# Patient Record
Sex: Male | Born: 1945
Health system: Southern US, Community
[De-identification: ages and names within clinical notes are randomized; demographics above are authoritative.]

## PROBLEM LIST (undated history)

## (undated) DIAGNOSIS — K269 Duodenal ulcer, unspecified as acute or chronic, without hemorrhage or perforation: Secondary | ICD-10-CM

## (undated) DIAGNOSIS — M1990 Unspecified osteoarthritis, unspecified site: Secondary | ICD-10-CM

## (undated) DIAGNOSIS — Z87442 Personal history of urinary calculi: Secondary | ICD-10-CM

## (undated) DIAGNOSIS — F329 Major depressive disorder, single episode, unspecified: Secondary | ICD-10-CM

## (undated) DIAGNOSIS — I10 Essential (primary) hypertension: Secondary | ICD-10-CM

## (undated) DIAGNOSIS — D649 Anemia, unspecified: Secondary | ICD-10-CM

## (undated) DIAGNOSIS — M109 Gout, unspecified: Secondary | ICD-10-CM

## (undated) DIAGNOSIS — Z5189 Encounter for other specified aftercare: Secondary | ICD-10-CM

## (undated) DIAGNOSIS — F32A Depression, unspecified: Secondary | ICD-10-CM

## (undated) DIAGNOSIS — N200 Calculus of kidney: Secondary | ICD-10-CM

## (undated) DIAGNOSIS — K2981 Duodenitis with bleeding: Secondary | ICD-10-CM

## (undated) HISTORY — DX: Depression, unspecified: F32.A

## (undated) HISTORY — PX: COLONOSCOPY: SHX174

## (undated) HISTORY — DX: Major depressive disorder, single episode, unspecified: F32.9

## (undated) HISTORY — PX: ABDOMINAL SURGERY: SHX537

## (undated) HISTORY — DX: Anemia, unspecified: D64.9

## (undated) HISTORY — DX: Duodenal ulcer, unspecified as acute or chronic, without hemorrhage or perforation: K26.9

## (undated) HISTORY — DX: Unspecified osteoarthritis, unspecified site: M19.90

## (undated) HISTORY — DX: Duodenitis with bleeding: K29.81

## (undated) HISTORY — PX: LITHOTRIPSY: SUR834

## (undated) HISTORY — DX: Encounter for other specified aftercare: Z51.89

---

## 1997-11-06 ENCOUNTER — Emergency Department (HOSPITAL_COMMUNITY): Admission: EM | Admit: 1997-11-06 | Discharge: 1997-11-06 | Payer: Self-pay

## 1997-11-08 ENCOUNTER — Encounter (HOSPITAL_COMMUNITY): Admission: RE | Admit: 1997-11-08 | Discharge: 1998-02-06 | Payer: Self-pay | Admitting: Emergency Medicine

## 1997-11-08 ENCOUNTER — Emergency Department (HOSPITAL_COMMUNITY): Admission: EM | Admit: 1997-11-08 | Discharge: 1997-11-08 | Payer: Self-pay | Admitting: Emergency Medicine

## 1997-11-12 ENCOUNTER — Emergency Department (HOSPITAL_COMMUNITY): Admission: EM | Admit: 1997-11-12 | Discharge: 1997-11-12 | Payer: Self-pay | Admitting: Emergency Medicine

## 1997-12-28 ENCOUNTER — Ambulatory Visit (HOSPITAL_COMMUNITY): Admission: RE | Admit: 1997-12-28 | Discharge: 1997-12-28 | Payer: Self-pay | Admitting: Urology

## 2000-12-23 ENCOUNTER — Encounter: Payer: Self-pay | Admitting: Urology

## 2000-12-23 ENCOUNTER — Encounter: Admission: RE | Admit: 2000-12-23 | Discharge: 2000-12-23 | Payer: Self-pay | Admitting: Urology

## 2000-12-28 ENCOUNTER — Ambulatory Visit (HOSPITAL_COMMUNITY): Admission: RE | Admit: 2000-12-28 | Discharge: 2000-12-28 | Payer: Self-pay | Admitting: Urology

## 2001-01-07 ENCOUNTER — Encounter: Admission: RE | Admit: 2001-01-07 | Discharge: 2001-01-07 | Payer: Self-pay | Admitting: Urology

## 2001-01-07 ENCOUNTER — Encounter: Payer: Self-pay | Admitting: Urology

## 2001-01-21 ENCOUNTER — Encounter: Payer: Self-pay | Admitting: Urology

## 2001-01-21 ENCOUNTER — Encounter: Admission: RE | Admit: 2001-01-21 | Discharge: 2001-01-21 | Payer: Self-pay | Admitting: Urology

## 2001-04-19 ENCOUNTER — Encounter: Payer: Self-pay | Admitting: Urology

## 2001-04-19 ENCOUNTER — Encounter: Admission: RE | Admit: 2001-04-19 | Discharge: 2001-04-19 | Payer: Self-pay | Admitting: Urology

## 2001-08-24 ENCOUNTER — Encounter: Admission: RE | Admit: 2001-08-24 | Discharge: 2001-08-24 | Payer: Self-pay | Admitting: Urology

## 2001-08-24 ENCOUNTER — Encounter: Payer: Self-pay | Admitting: Urology

## 2002-11-19 ENCOUNTER — Emergency Department (HOSPITAL_COMMUNITY): Admission: EM | Admit: 2002-11-19 | Discharge: 2002-11-19 | Payer: Self-pay | Admitting: Emergency Medicine

## 2006-01-06 ENCOUNTER — Encounter (INDEPENDENT_AMBULATORY_CARE_PROVIDER_SITE_OTHER): Payer: Self-pay | Admitting: Specialist

## 2006-01-06 ENCOUNTER — Encounter: Payer: Self-pay | Admitting: Internal Medicine

## 2006-01-06 ENCOUNTER — Ambulatory Visit (HOSPITAL_COMMUNITY): Admission: RE | Admit: 2006-01-06 | Discharge: 2006-01-06 | Payer: Self-pay | Admitting: Gastroenterology

## 2009-03-27 ENCOUNTER — Encounter (INDEPENDENT_AMBULATORY_CARE_PROVIDER_SITE_OTHER): Payer: Self-pay | Admitting: *Deleted

## 2009-05-04 ENCOUNTER — Encounter (INDEPENDENT_AMBULATORY_CARE_PROVIDER_SITE_OTHER): Payer: Self-pay | Admitting: *Deleted

## 2009-05-07 ENCOUNTER — Ambulatory Visit: Payer: Self-pay | Admitting: Internal Medicine

## 2009-05-23 ENCOUNTER — Ambulatory Visit: Payer: Self-pay | Admitting: Internal Medicine

## 2009-05-24 ENCOUNTER — Encounter: Payer: Self-pay | Admitting: Internal Medicine

## 2009-07-30 ENCOUNTER — Telehealth (INDEPENDENT_AMBULATORY_CARE_PROVIDER_SITE_OTHER): Payer: Self-pay | Admitting: *Deleted

## 2010-02-26 ENCOUNTER — Emergency Department (HOSPITAL_COMMUNITY): Admission: EM | Admit: 2010-02-26 | Discharge: 2010-02-26 | Payer: Self-pay | Admitting: Emergency Medicine

## 2010-06-18 NOTE — Progress Notes (Signed)
  Phone Note Outgoing Call   Summary of Call: Message left for pt. that we have not yet received colon and path. reports from Dr.Weissman as requested at the time of his colonoscopy for Dr.Perry's review.Asked if he could please try to obtain them. Initial call taken by: Teryl Lucy RN,  July 30, 2009 10:59 AM

## 2010-06-18 NOTE — Letter (Signed)
Summary: Patient Notice- Polyp Results  [facility]  [address] [place], [place] [address]   Phone: [phone]  Fax: [phone]        May 24, 2009 MRN: [id number]    [facility] [address] [place], [place]  [address]    Dear Mr. Lorensen,  I am pleased to inform you that the colon polyp(s) removed during your recent colonoscopy was (were) found to be benign (no cancer detected) upon pathologic examination.  I recommend you have a repeat colonoscopy examination in 5 years to look for recurrent polyps, as having colon polyps increases your risk for having recurrent polyps or even colon cancer in the future.  Should you develop new or worsening symptoms of abdominal pain, bowel habit changes or bleeding from the rectum or bowels, please schedule an evaluation with either your primary care physician or with me.  Additional information/recommendations:  __ No further action with gastroenterology is needed at this time. Please      follow-up with your primary care physician for your other healthcare      needs.   Please call us if you are having persistent problems or have questions about your condition that have not been fully answered at this time.  Sincerely,  Hilarie Fredrickson MD  This letter has been electronically signed by your physician.  Appended Document: Patient Notice- Polyp Results Letter mailed 01.13.11

## 2010-06-18 NOTE — Procedures (Signed)
Summary: Tasia Catchings MD  Tasia Catchings MD   Imported By: Lester [place]ise[place] 08/14/2009 07:59:50  _____________________________________________________________________  External Attachment:    Type:   Image     Comment:   External Document

## 2010-06-18 NOTE — Procedures (Signed)
Summary: Colonoscopy  Patient: Tyrone [name] Note: All result statuses are Final unless otherwise noted.  Tests: (1) Colonoscopy (COL)   COL Colonoscopy           DONE     [facility]     [address]     [place], [place]  [address]           COLONOSCOPY PROCEDURE REPORT           PATIENT:  Tyrone [name], Tyrone [name]  MR#:  [id number]     BIRTHDATE:  [birth year], 63 yrs. old  GENDER:  male           ENDOSCOPIST:  Wilhemina Bonito. Eda Keys, MD     Referred by:  .Direct (Self)           PROCEDURE DATE:  05/23/2009     PROCEDURE:  Colonoscopy with snare polypectomy     ASA CLASS:  Class II     INDICATIONS:  history of pre-cancerous (adenomatous) colon polyps     (two prior colonoscopies, last one ?58yrs ago w/ Dr. Sherin Quarry w/     polyp)           MEDICATIONS:   Fentanyl 100 mcg IV, Versed 10 mg IV           DESCRIPTION OF PROCEDURE:   After the risks benefits and     alternatives of the procedure were thoroughly explained, informed     consent was obtained.  Digital rectal exam was performed and     revealed no abnormalities.   The LB CF-[id number] endoscope     was introduced through the anus and advanced to the cecum, which     was identified by both the appendix and ileocecal valve, without     limitations.Time to cecum = 4:37min.  The quality of the prep was     excellent, using MoviPrep.  The instrument was then withdrawn     (time = 19:00 min) as the colon was fully examined.     <<PROCEDUREIMAGES>>           FINDINGS:  Three 2mm polyps were found in the cecum (2) and     ascending colon. Polyps were snared without cautery. Retrieval was     successful. Mild diverticulosis was found in the sigmoid colon.     This was otherwise a normal examination of the colon.   Retroflexed     views in the rectum revealed internal hemorrhoids.    The scope     was then withdrawn from the patient and the procedure completed.           COMPLICATIONS:  None           ENDOSCOPIC  IMPRESSION:     1) Three polyps - removed     2) Mild diverticulosis in the sigmoid colon     3) Otherwise normal examination     4) Internal hemorrhoids           RECOMMENDATIONS:     1) Follow up colonoscopy in 5 years     2) PLEASE GET PRIOR COLONOSCOPY REPORTS AND PATHOLOGY FROM DR     Fayrene Fearing [facility] FOR DR Marina Goodell TO REVIEW           ______________________________     Wilhemina Bonito. Eda Keys, MD           CC:  Georgann Housekeeper MD; The Patient  n.     eSIGNED:   Wilhemina Bonito. Eda Keys at 05/23/2009 10:38 AM           Givhan, Viviann Spare, [id number]  Note: An exclamation mark (!) indicates a result that was not dispersed into the flowsheet. Document Creation Date: 05/23/2009 10:38 AM _______________________________________________________________________  (1) Order result status: Final Collection or observation date-time: 05/23/2009 10:29 Requested date-time:  Receipt date-time:  Reported date-time:  Referring Physician:   Ordering Physician: Fransico Setters [phone]) Specimen Source:  Source: Launa Grill Order Number: [id number] Lab site:   Appended Document: Colonoscopy Recall colonoscopy in 5 years  Appended Document: Colonoscopy     Procedures Next Due Date:    Colonoscopy: 05/2014

## 2010-10-04 NOTE — Op Note (Signed)
[facility]  Patient:    Tyrone [name], Tyrone [name]                    MRN: [id number] Proc. Date: 12/28/00 Adm. Date:  [id number] Attending:  Thermon Leyland                           Operative Report  PREOPERATIVE DIAGNOSIS:  Left ureteropelvic junction stone.  POSTOPERATIVE DIAGNOSIS: 1. Left ureteropelvic junction stone. 2. Left ureteropelvic junction obstruction.  OPERATION PERFORMED:  Cystoscopy, retrograde pyelography, balloon dilation of the left ureteropelvic junction, flexible and rigid ureteroscopy and double-J stent placement.  SURGEON:  Barron Alvine, M.D.  ANESTHESIA:  General.  INDICATIONS FOR PROCEDURE:  Tyrone [name] is a 65 year old male.  He has had recurrent nephrolithiasis.  He has been able to pass stones on the right side without much difficulty but has had difficulty passing left-sided stones and was thought to potentially have a mild left ureteropelvic junction obstruction.  He presented with flank pain and the CT scan showed what appeared to be a 3 to 4 mm stone wedged in the left ureteropelvic junction. Because of his previous history, he did not want to continue with efforts at spontaneous passage.  He has also failed lithotripsy in the past and given the worries over anatomic configuration of this area, we thought attempt at ureteroscopy with double-J stent placement and assessment of ureteropelvic junction was probably the best course of action.  DESCRIPTION OF PROCEDURE:  The patient was brought to the operating room where he had successful induction of general anesthesia.  He was placed in lithotomy position and prepped and draped in the usual manner.  Cystoscopy was unremarkable.  Retrograde pyelogram showed a normal ureter without filling defects with significant narrowing and obstruction at the ureteropelvic junction.  It was difficult to determine whether this was simply an anatomic narrowing or whether there was a  stone present as well.  The guide wire was able to be passed through this area without difficulty.  Rigid ureteroscopy was then performed.  The entire ureter was unremarkable.  As we approached the ureteropelvic junction, one could see considerable narrowing and the opening only allowed the wire to pass.  We were unable to insert the ureteroscope into the renal pelvis.  We did not see an obvious stone and this appeared to be a narrowed area.  The rigid ureteroscope was removed and a 4 cm standard dilating balloon was then placed across the ureteropelvic junction.  This was inflated to 12 atmospheres of pressure for approximately five minutes and then deflated.  Repeat ureteroscopy was done with the flexible scope.  An access sheath was inserted over the guide wire.  The miniflexible ureteroscope was then inserted alongside the wire.  When we encountered the ureteropelvic junction, one could see a marked improvement and this was fairly open and easily accessible with the flexible scope.  One could see numerous little pieces of stone material and the thought was that potentially the balloon  had broken up the stone that had been apparently lodged in the ureteropelvic junction.  I was able to perform ureteroscopy of the other caliceal system and did not see obvious stones that I could appreciate.  The flexible ureteroscope was then removed.  Over the guide wire an 8.5 French 24 cm double-J stent was placed to try to hopefully keep the ureteropelvic junction area open.  The patient may require more definitive intervention  such as an Acusize or possible open pyeloplasty in the future but hopefully not.  We will get follow-up imaging studies done to be sure he does not have any residual calculi that were missed with ureteroscopy. DD:  12/28/00 TD:  12/28/00 Job: [id number]

## 2011-08-07 DIAGNOSIS — F329 Major depressive disorder, single episode, unspecified: Secondary | ICD-10-CM | POA: Diagnosis not present

## 2011-08-07 DIAGNOSIS — M109 Gout, unspecified: Secondary | ICD-10-CM | POA: Diagnosis not present

## 2011-08-07 DIAGNOSIS — I10 Essential (primary) hypertension: Secondary | ICD-10-CM | POA: Diagnosis not present

## 2011-08-07 DIAGNOSIS — Z1331 Encounter for screening for depression: Secondary | ICD-10-CM | POA: Diagnosis not present

## 2011-09-17 DIAGNOSIS — M109 Gout, unspecified: Secondary | ICD-10-CM | POA: Diagnosis not present

## 2011-09-17 DIAGNOSIS — M1990 Unspecified osteoarthritis, unspecified site: Secondary | ICD-10-CM | POA: Diagnosis not present

## 2011-09-17 DIAGNOSIS — Z79899 Other long term (current) drug therapy: Secondary | ICD-10-CM | POA: Diagnosis not present

## 2012-04-09 DIAGNOSIS — Z23 Encounter for immunization: Secondary | ICD-10-CM | POA: Diagnosis not present

## 2012-09-16 DIAGNOSIS — M109 Gout, unspecified: Secondary | ICD-10-CM | POA: Diagnosis not present

## 2012-09-16 DIAGNOSIS — Z79899 Other long term (current) drug therapy: Secondary | ICD-10-CM | POA: Diagnosis not present

## 2012-09-16 DIAGNOSIS — M1990 Unspecified osteoarthritis, unspecified site: Secondary | ICD-10-CM | POA: Diagnosis not present

## 2013-01-21 DIAGNOSIS — D3130 Benign neoplasm of unspecified choroid: Secondary | ICD-10-CM | POA: Diagnosis not present

## 2013-01-21 DIAGNOSIS — H2510 Age-related nuclear cataract, unspecified eye: Secondary | ICD-10-CM | POA: Diagnosis not present

## 2013-01-21 DIAGNOSIS — H359 Unspecified retinal disorder: Secondary | ICD-10-CM | POA: Diagnosis not present

## 2013-01-21 DIAGNOSIS — H5200 Hypermetropia, unspecified eye: Secondary | ICD-10-CM | POA: Diagnosis not present

## 2013-01-27 DIAGNOSIS — N400 Enlarged prostate without lower urinary tract symptoms: Secondary | ICD-10-CM | POA: Diagnosis not present

## 2013-01-27 DIAGNOSIS — E782 Mixed hyperlipidemia: Secondary | ICD-10-CM | POA: Diagnosis not present

## 2013-01-27 DIAGNOSIS — I10 Essential (primary) hypertension: Secondary | ICD-10-CM | POA: Diagnosis not present

## 2013-01-27 DIAGNOSIS — Z0000 Encounter for general adult medical examination without abnormal findings: Secondary | ICD-10-CM | POA: Diagnosis not present

## 2013-01-27 DIAGNOSIS — F329 Major depressive disorder, single episode, unspecified: Secondary | ICD-10-CM | POA: Diagnosis not present

## 2013-01-27 DIAGNOSIS — M109 Gout, unspecified: Secondary | ICD-10-CM | POA: Diagnosis not present

## 2013-01-27 DIAGNOSIS — N529 Male erectile dysfunction, unspecified: Secondary | ICD-10-CM | POA: Diagnosis not present

## 2013-01-27 DIAGNOSIS — Z1331 Encounter for screening for depression: Secondary | ICD-10-CM | POA: Diagnosis not present

## 2013-03-08 DIAGNOSIS — I10 Essential (primary) hypertension: Secondary | ICD-10-CM | POA: Diagnosis not present

## 2013-03-08 DIAGNOSIS — R7309 Other abnormal glucose: Secondary | ICD-10-CM | POA: Diagnosis not present

## 2013-03-08 DIAGNOSIS — Z23 Encounter for immunization: Secondary | ICD-10-CM | POA: Diagnosis not present

## 2013-03-22 DIAGNOSIS — Z79899 Other long term (current) drug therapy: Secondary | ICD-10-CM | POA: Diagnosis not present

## 2013-03-22 DIAGNOSIS — M109 Gout, unspecified: Secondary | ICD-10-CM | POA: Diagnosis not present

## 2013-03-22 DIAGNOSIS — M1990 Unspecified osteoarthritis, unspecified site: Secondary | ICD-10-CM | POA: Diagnosis not present

## 2013-03-23 DIAGNOSIS — M109 Gout, unspecified: Secondary | ICD-10-CM | POA: Diagnosis not present

## 2013-03-30 DIAGNOSIS — I10 Essential (primary) hypertension: Secondary | ICD-10-CM | POA: Diagnosis not present

## 2013-03-30 DIAGNOSIS — R7989 Other specified abnormal findings of blood chemistry: Secondary | ICD-10-CM | POA: Diagnosis not present

## 2013-06-15 DIAGNOSIS — Z23 Encounter for immunization: Secondary | ICD-10-CM | POA: Diagnosis not present

## 2013-06-15 DIAGNOSIS — I10 Essential (primary) hypertension: Secondary | ICD-10-CM | POA: Diagnosis not present

## 2013-09-13 DIAGNOSIS — F329 Major depressive disorder, single episode, unspecified: Secondary | ICD-10-CM | POA: Diagnosis not present

## 2013-09-13 DIAGNOSIS — I10 Essential (primary) hypertension: Secondary | ICD-10-CM | POA: Diagnosis not present

## 2013-09-13 DIAGNOSIS — M109 Gout, unspecified: Secondary | ICD-10-CM | POA: Diagnosis not present

## 2013-09-20 DIAGNOSIS — M25549 Pain in joints of unspecified hand: Secondary | ICD-10-CM | POA: Diagnosis not present

## 2013-09-20 DIAGNOSIS — M1990 Unspecified osteoarthritis, unspecified site: Secondary | ICD-10-CM | POA: Diagnosis not present

## 2013-09-20 DIAGNOSIS — R5383 Other fatigue: Secondary | ICD-10-CM | POA: Diagnosis not present

## 2013-09-20 DIAGNOSIS — M109 Gout, unspecified: Secondary | ICD-10-CM | POA: Diagnosis not present

## 2013-09-20 DIAGNOSIS — R5381 Other malaise: Secondary | ICD-10-CM | POA: Diagnosis not present

## 2013-09-20 DIAGNOSIS — Z79899 Other long term (current) drug therapy: Secondary | ICD-10-CM | POA: Diagnosis not present

## 2013-10-12 DIAGNOSIS — M25549 Pain in joints of unspecified hand: Secondary | ICD-10-CM | POA: Diagnosis not present

## 2013-10-12 DIAGNOSIS — M1990 Unspecified osteoarthritis, unspecified site: Secondary | ICD-10-CM | POA: Diagnosis not present

## 2013-10-12 DIAGNOSIS — Z79899 Other long term (current) drug therapy: Secondary | ICD-10-CM | POA: Diagnosis not present

## 2013-10-12 DIAGNOSIS — M109 Gout, unspecified: Secondary | ICD-10-CM | POA: Diagnosis not present

## 2014-02-16 DIAGNOSIS — N400 Enlarged prostate without lower urinary tract symptoms: Secondary | ICD-10-CM | POA: Diagnosis not present

## 2014-02-16 DIAGNOSIS — K219 Gastro-esophageal reflux disease without esophagitis: Secondary | ICD-10-CM | POA: Diagnosis not present

## 2014-02-16 DIAGNOSIS — Z23 Encounter for immunization: Secondary | ICD-10-CM | POA: Diagnosis not present

## 2014-02-16 DIAGNOSIS — R7309 Other abnormal glucose: Secondary | ICD-10-CM | POA: Diagnosis not present

## 2014-02-16 DIAGNOSIS — F322 Major depressive disorder, single episode, severe without psychotic features: Secondary | ICD-10-CM | POA: Diagnosis not present

## 2014-02-16 DIAGNOSIS — I10 Essential (primary) hypertension: Secondary | ICD-10-CM | POA: Diagnosis not present

## 2014-02-16 DIAGNOSIS — Z0000 Encounter for general adult medical examination without abnormal findings: Secondary | ICD-10-CM | POA: Diagnosis not present

## 2014-02-16 DIAGNOSIS — M109 Gout, unspecified: Secondary | ICD-10-CM | POA: Diagnosis not present

## 2014-02-16 DIAGNOSIS — N529 Male erectile dysfunction, unspecified: Secondary | ICD-10-CM | POA: Diagnosis not present

## 2014-03-09 DIAGNOSIS — M1009 Idiopathic gout, multiple sites: Secondary | ICD-10-CM | POA: Diagnosis not present

## 2014-03-09 DIAGNOSIS — Z79899 Other long term (current) drug therapy: Secondary | ICD-10-CM | POA: Diagnosis not present

## 2014-03-09 DIAGNOSIS — M79643 Pain in unspecified hand: Secondary | ICD-10-CM | POA: Diagnosis not present

## 2014-03-09 DIAGNOSIS — M1990 Unspecified osteoarthritis, unspecified site: Secondary | ICD-10-CM | POA: Diagnosis not present

## 2014-05-13 ENCOUNTER — Emergency Department (HOSPITAL_COMMUNITY)
Admission: EM | Admit: 2014-05-13 | Discharge: 2014-05-13 | Disposition: A | Discharge status: Home / Self Care | Payer: Medicare Other | Attending: Emergency Medicine | Admitting: Emergency Medicine

## 2014-05-13 ENCOUNTER — Emergency Department (HOSPITAL_COMMUNITY): Payer: Medicare Other

## 2014-05-13 ENCOUNTER — Encounter (HOSPITAL_COMMUNITY): Payer: Self-pay | Admitting: Emergency Medicine

## 2014-05-13 DIAGNOSIS — N200 Calculus of kidney: Secondary | ICD-10-CM | POA: Diagnosis not present

## 2014-05-13 DIAGNOSIS — I10 Essential (primary) hypertension: Secondary | ICD-10-CM | POA: Diagnosis not present

## 2014-05-13 DIAGNOSIS — N23 Unspecified renal colic: Secondary | ICD-10-CM | POA: Insufficient documentation

## 2014-05-13 DIAGNOSIS — R1012 Left upper quadrant pain: Secondary | ICD-10-CM | POA: Diagnosis not present

## 2014-05-13 DIAGNOSIS — N281 Cyst of kidney, acquired: Secondary | ICD-10-CM | POA: Diagnosis not present

## 2014-05-13 DIAGNOSIS — Z87442 Personal history of urinary calculi: Secondary | ICD-10-CM | POA: Diagnosis not present

## 2014-05-13 DIAGNOSIS — R109 Unspecified abdominal pain: Secondary | ICD-10-CM | POA: Diagnosis present

## 2014-05-13 DIAGNOSIS — R1032 Left lower quadrant pain: Secondary | ICD-10-CM | POA: Diagnosis not present

## 2014-05-13 HISTORY — DX: Calculus of kidney: N20.0

## 2014-05-13 HISTORY — DX: Essential (primary) hypertension: I10

## 2014-05-13 LAB — CBC WITH DIFFERENTIAL/PLATELET
BASOS ABS: 0.1 10*3/uL (ref 0.0–0.1)
BASOS PCT: 1 % (ref 0–1)
Eosinophils Absolute: 0.2 10*3/uL (ref 0.0–0.7)
Eosinophils Relative: 3 % (ref 0–5)
HCT: 39.8 % (ref 39.0–52.0)
HEMOGLOBIN: 12.8 g/dL — AB (ref 13.0–17.0)
Lymphocytes Relative: 25 % (ref 12–46)
Lymphs Abs: 2 10*3/uL (ref 0.7–4.0)
MCH: 31.1 pg (ref 26.0–34.0)
MCHC: 32.2 g/dL (ref 30.0–36.0)
MCV: 96.6 fL (ref 78.0–100.0)
MONOS PCT: 5 % (ref 3–12)
Monocytes Absolute: 0.4 10*3/uL (ref 0.1–1.0)
NEUTROS ABS: 5.2 10*3/uL (ref 1.7–7.7)
NEUTROS PCT: 66 % (ref 43–77)
PLATELETS: 202 10*3/uL (ref 150–400)
RBC: 4.12 MIL/uL — ABNORMAL LOW (ref 4.22–5.81)
RDW: 11.9 % (ref 11.5–15.5)
WBC: 7.8 10*3/uL (ref 4.0–10.5)

## 2014-05-13 LAB — URINALYSIS, ROUTINE W REFLEX MICROSCOPIC
Bilirubin Urine: NEGATIVE
Glucose, UA: 500 mg/dL — AB
Hgb urine dipstick: NEGATIVE
Ketones, ur: NEGATIVE mg/dL
Leukocytes, UA: NEGATIVE
Nitrite: NEGATIVE
Protein, ur: NEGATIVE mg/dL
Specific Gravity, Urine: 1.022 (ref 1.005–1.030)
Urobilinogen, UA: 0.2 mg/dL (ref 0.0–1.0)
pH: 5 (ref 5.0–8.0)

## 2014-05-13 LAB — COMPREHENSIVE METABOLIC PANEL
ALT: 19 U/L (ref 0–53)
AST: 22 U/L (ref 0–37)
Albumin: 3.8 g/dL (ref 3.5–5.2)
Alkaline Phosphatase: 69 U/L (ref 39–117)
Anion gap: 6 (ref 5–15)
BUN: 19 mg/dL (ref 6–23)
CO2: 27 mmol/L (ref 19–32)
CREATININE: 0.95 mg/dL (ref 0.50–1.35)
Calcium: 9.1 mg/dL (ref 8.4–10.5)
Chloride: 106 mEq/L (ref 96–112)
GFR calc Af Amer: >90 mL/min (ref >90)
GFR, EST NON AFRICAN AMERICAN: 84 mL/min — AB (ref >90)
Glucose, Bld: 114 mg/dL — ABNORMAL HIGH (ref 70–99)
Potassium: 3.7 mmol/L (ref 3.5–5.1)
Sodium: 139 mmol/L (ref 135–145)
Total Bilirubin: 0.8 mg/dL (ref 0.3–1.2)
Total Protein: 6.9 g/dL (ref 6.0–8.3)

## 2014-05-13 LAB — LIPASE, BLOOD: LIPASE: 21 U/L (ref 11–59)

## 2014-05-13 LAB — CBG MONITORING, ED: Glucose-Capillary: 98 mg/dL (ref 70–99)

## 2014-05-13 IMAGING — CT CT RENAL STONE PROTOCOL
1 series · 15 of 32 positions shown, 19 images · non-contrast
Comparison: None.

CLINICAL DATA: Left-sided flank pain since this morning, history of
renal calculi

EXAM:
CT ABDOMEN AND PELVIS WITHOUT CONTRAST
TECHNIQUE: Multidetector CT imaging of the abdomen and pelvis was performed
following the standard protocol without IV contrast.

[Series 6: lung · axial · 0.91mm/px · z∈[-84,+66]mm · 15 of 35 slices shown, 19 images]
[im 3/35  soft-tissue]
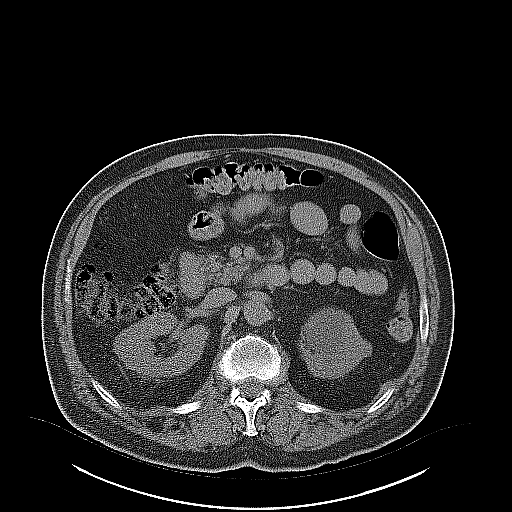
[im 3/35  bone]
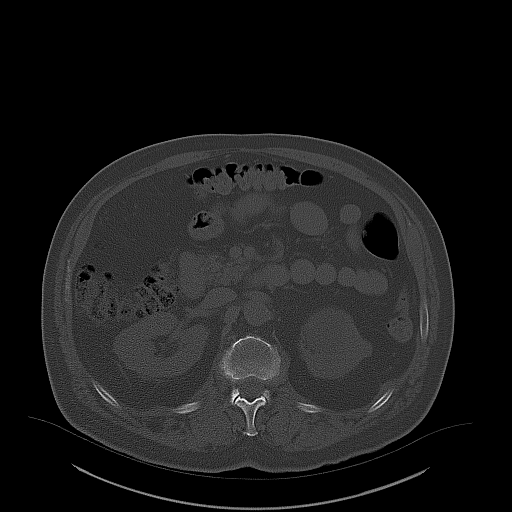
[im 5/35  soft-tissue]
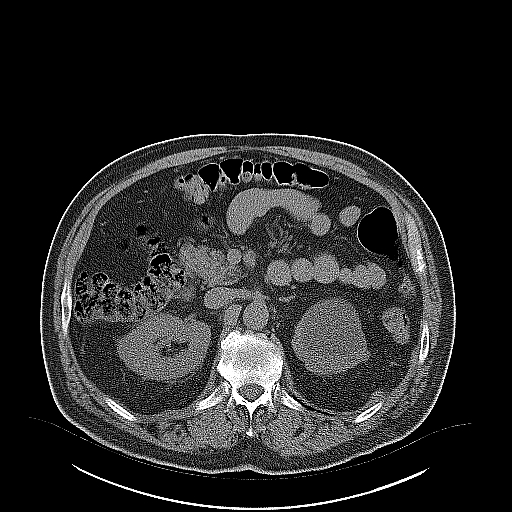
[im 7/35  soft-tissue]
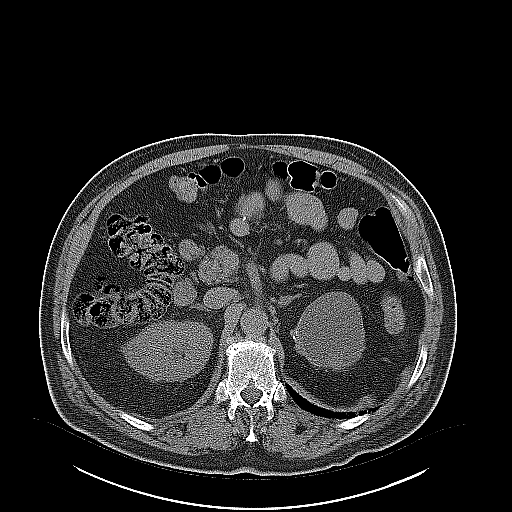
[im 10/35  soft-tissue]
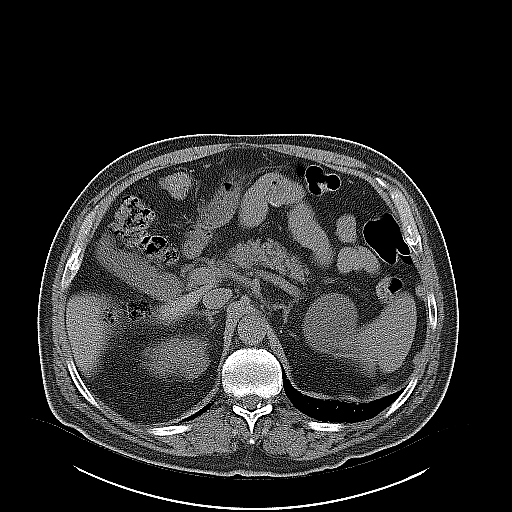
[im 13/35  soft-tissue]
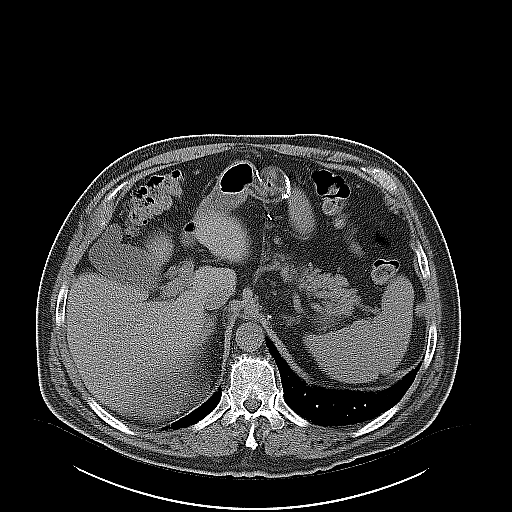
[im 15/35  soft-tissue]
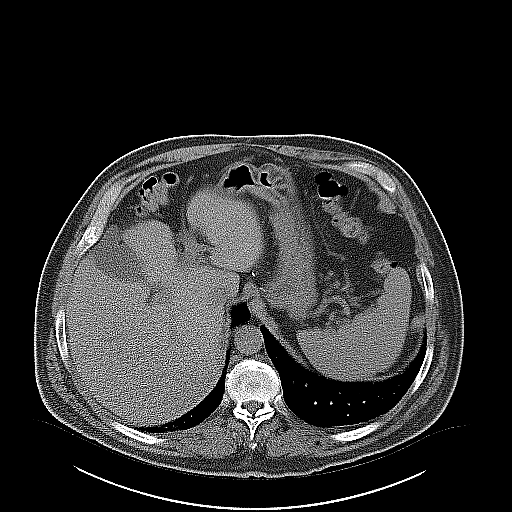
[im 18/35  soft-tissue]
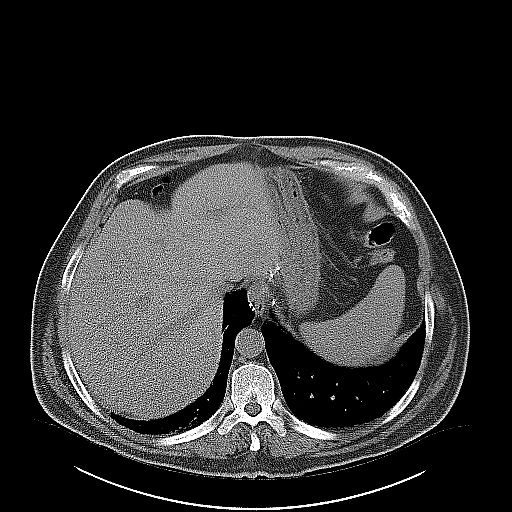
[im 20/35  soft-tissue]
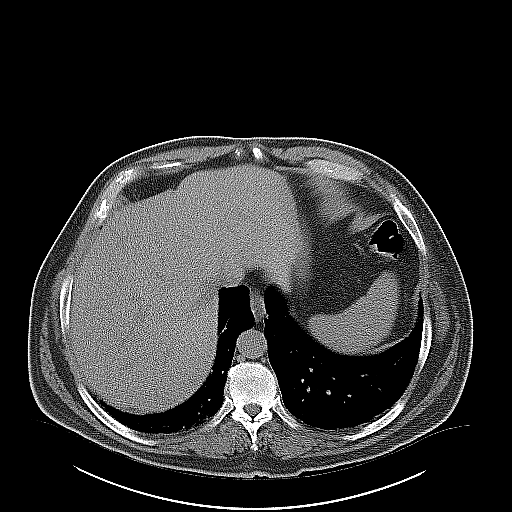
[im 22/35  soft-tissue]
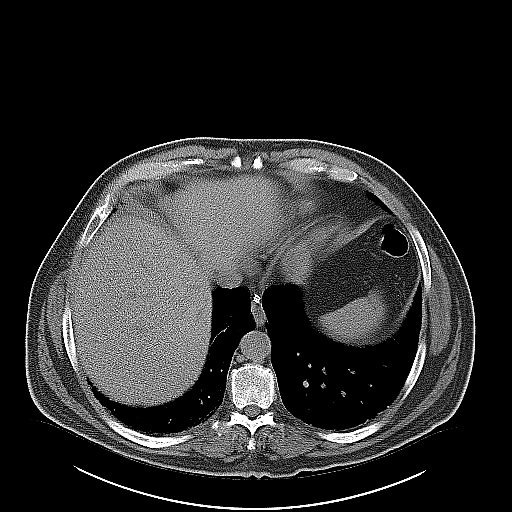
[im 22/35  bone]
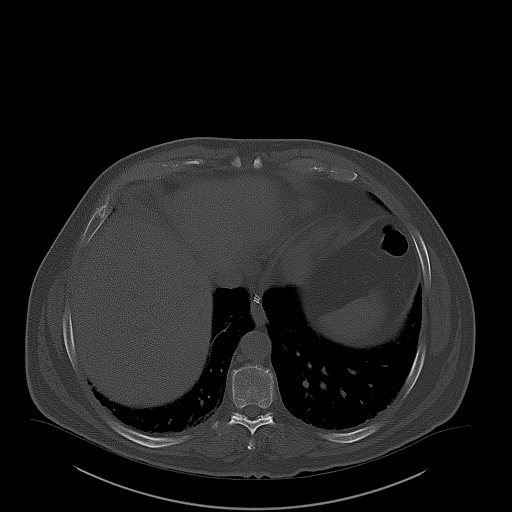
[im 25/35  soft-tissue]
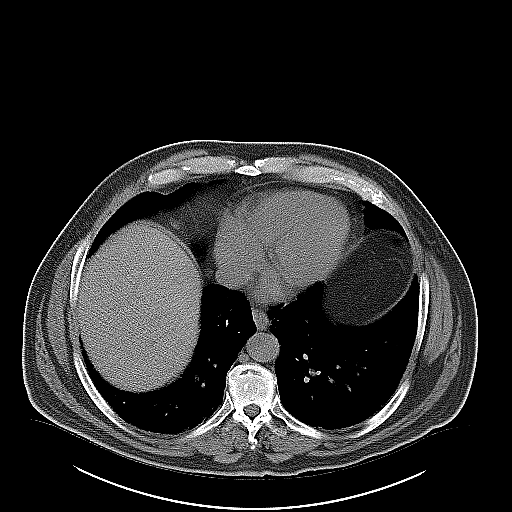
[im 28/35  soft-tissue]
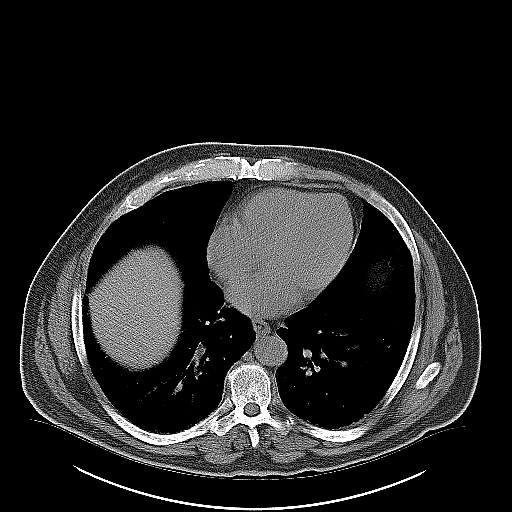
[im 30/35  soft-tissue]
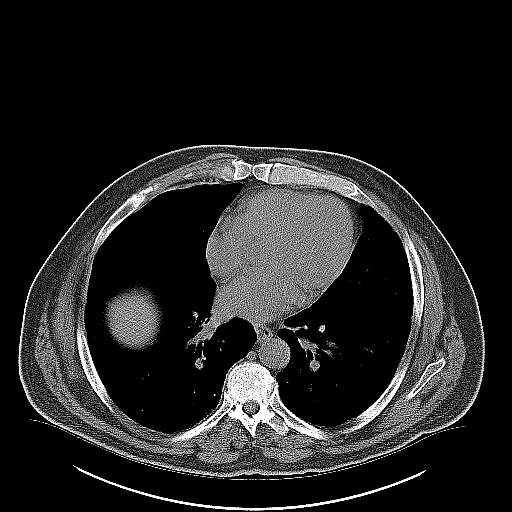
[im 30/35  lung]
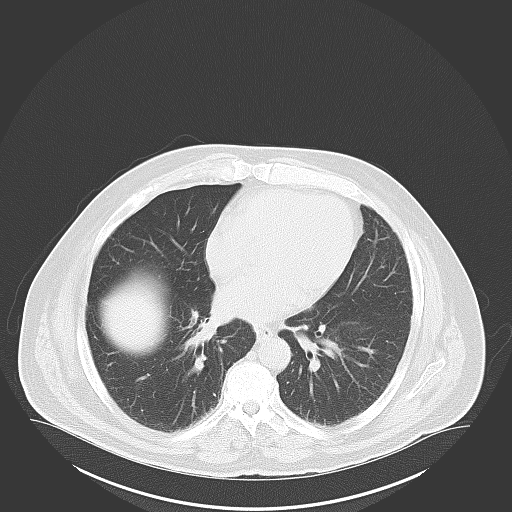
[im 31/35  lung]
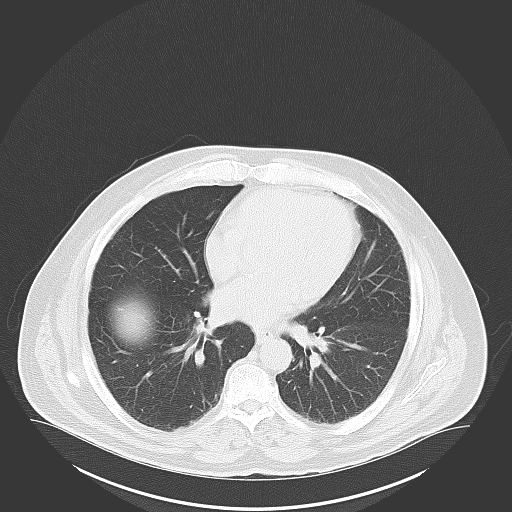
[im 32/35  soft-tissue]
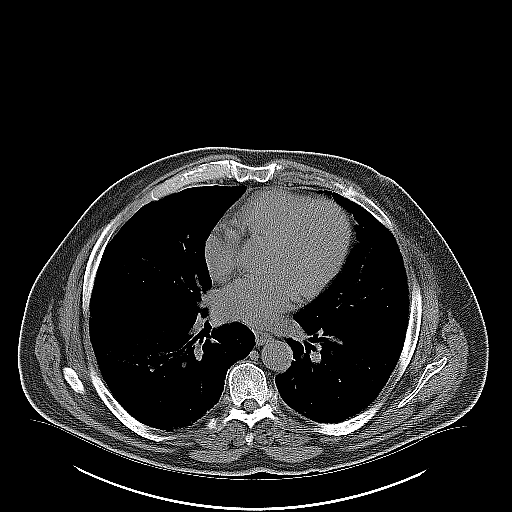
[im 32/35  lung]
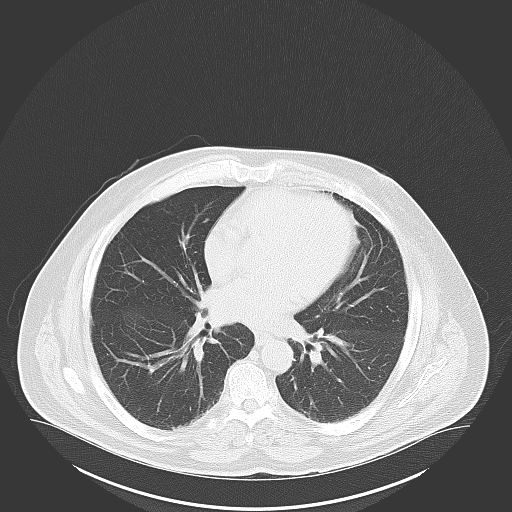
[im 33/35  lung]
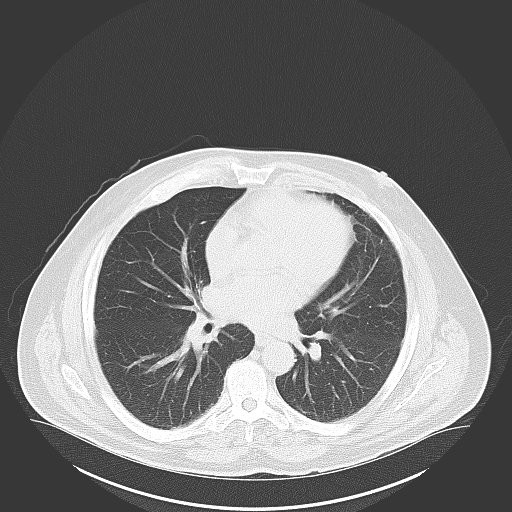

[15 of 32 positions shown; findings below may reference images not displayed]

FINDINGS: The lung bases are free of acute infiltrate or sizable effusion.

The liver, gallbladder, spleen, adrenal glands and pancreas are
within normal limits the kidneys are well visualized bilaterally.
The right kidney is within normal limits without renal or ureteral
calculi. The left kidney demonstrates multiple cystic lesions. The
largest of these lies in the upper pole measuring approximately
cm. Nonobstructing left renal stones are seen. The largest of these
lies in the lower pole measuring 10 mm. No ureteral calculi or
obstructive changes are noted. The bladder is well distended.

The appendix is within normal limits. No significant diverticular
disease is noted. No pelvic mass lesion is seen. The osseous
structures show degenerative change of the lumbar spine.
IMPRESSION: Bilateral renal cystic changes.

Nonobstructing left renal calculi.

No acute abnormality is noted.

## 2014-05-13 MED ORDER — MORPHINE SULFATE 4 MG/ML IJ SOLN
4.0000 mg | Freq: Once | INTRAMUSCULAR | Status: AC
  Administered 2014-05-13: 4 mg via INTRAVENOUS
  Filled 2014-05-13: qty 1

## 2014-05-13 MED ORDER — OXYCODONE-ACETAMINOPHEN 5-325 MG PO TABS: 1.0000 | ORAL_TABLET | ORAL | Status: DC | PRN

## 2014-05-13 MED ORDER — ONDANSETRON HCL 4 MG/2ML IJ SOLN
4.0000 mg | Freq: Once | INTRAMUSCULAR | Status: AC
  Administered 2014-05-13: 4 mg via INTRAVENOUS
  Filled 2014-05-13: qty 2

## 2014-05-13 MED ORDER — ONDANSETRON 8 MG PO TBDP: ORAL_TABLET | ORAL | Status: DC

## 2014-05-13 MED ORDER — KETOROLAC TROMETHAMINE 30 MG/ML IJ SOLN
30.0000 mg | Freq: Once | INTRAMUSCULAR | Status: AC
  Administered 2014-05-13: 30 mg via INTRAVENOUS
  Filled 2014-05-13: qty 1

## 2014-05-13 NOTE — ED Provider Notes (Signed)
CSN: [id number]     Arrival date & time 05/13/14  1600 History   First MD Initiated Contact with Patient 05/13/14 1718     Chief Complaint  Patient presents with  . Flank Pain  . Nausea  . Emesis     (Consider location/radiation/quality/duration/timing/severity/associated sxs/prior Treatment) Patient is a 68 y.o. male presenting with flank pain and vomiting. The history is provided by the patient and medical records. No language interpreter was used.  Flank Pain Associated symptoms include nausea and vomiting. Pertinent negatives include no abdominal pain, chest pain, coughing, diaphoresis, fatigue, fever, headaches or rash.  Emesis Associated symptoms: no abdominal pain, no diarrhea and no headaches      NILO FALLIN is a 68 y.o. male  with a hx of hypertension, kidney stones, depression presents to the Emergency Department complaining of gradual, persistent, progressively worsening left flank pain onset 11 AM. Associated symptoms include nausea and vomiting 2 episodes. Patient reports emesis is nonbloody and nonbilious. He reports this pain is similar to previous episodes of kidney stones. He reports his last kidney stone was in 1988.  Patient follows with Dr. Risa Grill at [facility] urology.  No eye rating or alleviating factors. Patient denies fever, chills, headache, neck pain, chest pain, shortness of breath, abdominal pain, diarrhea, weakness, dizziness, syncope, dysuria, hematuria.  Patient also specifically denies testicular or penile pain.    Past Medical History  Diagnosis Date  . Kidney stone   . Hypertension    Past Surgical History  Procedure Laterality Date  . Lithotripsy    . Abdominal surgery     No family history on file. History  Substance Use Topics  . Smoking status: Never Smoker   . Smokeless tobacco: Not on file  . Alcohol Use: No    Review of Systems  Constitutional: Negative for fever, diaphoresis, appetite change, fatigue and unexpected weight  change.  HENT: Negative for mouth sores.   Eyes: Negative for visual disturbance.  Respiratory: Negative for cough, chest tightness, shortness of breath and wheezing.   Cardiovascular: Negative for chest pain.  Gastrointestinal: Positive for nausea and vomiting. Negative for abdominal pain, diarrhea and constipation.  Endocrine: Negative for polydipsia, polyphagia and polyuria.  Genitourinary: Positive for flank pain. Negative for dysuria, urgency, frequency and hematuria.  Musculoskeletal: Negative for back pain and neck stiffness.  Skin: Negative for rash.  Allergic/Immunologic: Negative for immunocompromised state.  Neurological: Negative for syncope, light-headedness and headaches.  Hematological: Does not bruise/bleed easily.  Psychiatric/Behavioral: Negative for sleep disturbance. The patient is not nervous/anxious.       Allergies  Review of patient's allergies indicates no known allergies.  Home Medications   Prior to Admission medications   Medication Sig Start Date End Date Taking? Authorizing Provider  allopurinol (ZYLOPRIM) 300 MG tablet Take 1 tablet by mouth daily. 05/01/14  Yes Historical Provider, MD  amLODipine (NORVASC) 2.5 MG tablet Take 1 tablet by mouth daily. 05/01/14  Yes Historical Provider, MD  FLUoxetine (PROZAC) 20 MG capsule Take 1 capsule by mouth daily. 05/01/14  Yes Historical Provider, MD  ramipril (ALTACE) 10 MG capsule Take 1 capsule by mouth daily. 05/01/14  Yes Historical Provider, MD  ondansetron (ZOFRAN ODT) 8 MG disintegrating tablet 8mg  ODT q4 hours prn nausea 05/13/14   Jarrett Soho Scotty Pinder, PA-C  oxyCODONE-acetaminophen (PERCOCET) 5-325 MG per tablet Take 1-2 tablets by mouth every 4 (four) hours as needed. 05/13/14   Ivis Henneman, PA-C   BP 165/77 mmHg  Pulse 76  Temp(Src) 98.5 F (  36.9 C) (Oral)  Resp 18  SpO2 96% Physical Exam  Constitutional: He appears well-developed and well-nourished. No distress.  HENT:  Head:  Normocephalic and atraumatic.  Mouth/Throat: Oropharynx is clear and moist. No oropharyngeal exudate.  Cardiovascular: Normal rate, regular rhythm, normal heart sounds and intact distal pulses.   Pulmonary/Chest: Effort normal and breath sounds normal. No respiratory distress. He has no wheezes.  Abdominal: Soft. Bowel sounds are normal. He exhibits no distension and no mass. There is no tenderness. There is CVA tenderness (left). There is no rebound and no guarding.  Musculoskeletal: Normal range of motion. He exhibits no edema.  Neurological: He is alert.  Skin: Skin is warm and dry. No rash noted. He is not diaphoretic.  Psychiatric: He has a normal mood and affect.  Nursing note and vitals reviewed.   ED Course  Procedures (including critical care time) Labs Review Labs Reviewed  URINALYSIS, ROUTINE W REFLEX MICROSCOPIC - Abnormal; Notable for the following:    Glucose, UA 500 (*)    All other components within normal limits  CBC WITH DIFFERENTIAL - Abnormal; Notable for the following:    RBC 4.12 (*)    Hemoglobin 12.8 (*)    All other components within normal limits  COMPREHENSIVE METABOLIC PANEL - Abnormal; Notable for the following:    Glucose, Bld 114 (*)    GFR calc non Af Amer 84 (*)    All other components within normal limits  LIPASE, BLOOD  CBG MONITORING, ED    Imaging Review Ct Renal Stone Study  05/13/2014   CLINICAL DATA:  Left-sided flank pain since this morning, history of renal calculi  EXAM: CT ABDOMEN AND PELVIS WITHOUT CONTRAST  TECHNIQUE: Multidetector CT imaging of the abdomen and pelvis was performed following the standard protocol without IV contrast.  COMPARISON:  None.  FINDINGS: The lung bases are free of acute infiltrate or sizable effusion.  The liver, gallbladder, spleen, adrenal glands and pancreas are within normal limits the kidneys are well visualized bilaterally. The right kidney is within normal limits without renal or ureteral calculi. The  left kidney demonstrates multiple cystic lesions. The largest of these lies in the upper pole measuring approximately 6.4 cm. Nonobstructing left renal stones are seen. The largest of these lies in the lower pole measuring 10 mm. No ureteral calculi or obstructive changes are noted. The bladder is well distended.  The appendix is within normal limits. No significant diverticular disease is noted. No pelvic mass lesion is seen. The osseous structures show degenerative change of the lumbar spine.  IMPRESSION: Bilateral renal cystic changes.  Nonobstructing left renal calculi.  No acute abnormality is noted.   Electronically Signed   By: Inez Catalina M.D.   On: 05/13/2014 19:29     EKG Interpretation None      MDM   Final diagnoses:  Left flank pain  Renal colic on left side  Renal cyst, left   [name] presents with Hx of kidney stones and left flank pain.  Patient alert, oriented, nontoxic and nonseptic appearing. No CVA tenderness consistent with kidney stone. No hemoglobin in his urine. Will obtain CT renal, give pain control and continue assessment.  8:32 PM Labs reassuring. No evidence of urinary tract infection.  No leukocytosis.  Urinalysis with glucose however patient without elevated serum glucose. CT renal with  bilateral renal cystic changes with nonobstructing left renal calculi and no ureteral stones noted.  No hydronephrosis or obstructive changes noted. Patient is afebrile  and non-tachycardic. His pain is well controlled at this time and he wishes discharge home. Patient is to follow-up with his urologist within 2-3 days. His return to emergency department for high fevers, intractable vomiting or worsening symptoms.  I have personally reviewed patient's vitals, nursing note and any pertinent labs or imaging.  I performed an undressed physical exam.    It has been determined that no acute conditions requiring further emergency intervention are present at this time. The  patient/guardian have been advised of the diagnosis and plan. I reviewed all labs and imaging including any potential incidental findings. We have discussed signs and symptoms that warrant return to the ED and they are listed in the discharge instructions.    Vital signs are stable at discharge.   BP 165/77 mmHg  Pulse 76  Temp(Src) 98.5 F (36.9 C) (Oral)  Resp 18  SpO2 96%  The patient was discussed with and seen by Dr. Wilson Singer who agrees with the treatment plan.    Jarrett Soho Jasdeep Dejarnett, PA-C 05/14/14 0107  Virgel Manifold, MD 05/15/14 [phone]

## 2014-05-13 NOTE — Discharge Instructions (Signed)
1. Medications: Percocet, Zofran, usual home medications 2. Treatment: rest, drink plenty of fluids,  3. Follow Up: Please followup with your urologist in 2 days for discussion of your diagnoses and further evaluation after today's visit; if you do not have a primary care doctor use the resource guide provided to find one; Please return to the ER for worsening pain, fevers or persistent vomiting    Flank Pain Flank pain refers to pain that is located on the side of the body between the upper abdomen and the back. The pain may occur over a short period of time (acute) or may be long-term or reoccurring (chronic). It may be mild or severe. Flank pain can be caused by many things. CAUSES  Some of the more common causes of flank pain include:  Muscle strains.   Muscle spasms.   A disease of your spine (vertebral disk disease).   A lung infection (pneumonia).   Fluid around your lungs (pulmonary edema).   A kidney infection.   Kidney stones.   A very painful skin rash caused by the chickenpox virus (shingles).   Gallbladder disease.  [place] care will depend on the cause of your pain. In general,  Rest as directed by your caregiver.  Drink enough fluids to keep your urine clear or pale yellow.  Only take over-the-counter or prescription medicines as directed by your caregiver. Some medicines may help relieve the pain.  Tell your caregiver about any changes in your pain.  Follow up with your caregiver as directed. SEEK IMMEDIATE MEDICAL CARE IF:   Your pain is not controlled with medicine.   You have new or worsening symptoms.  Your pain increases.   You have abdominal pain.   You have shortness of breath.   You have persistent nausea or vomiting.   You have swelling in your abdomen.   You feel faint or pass out.   You have blood in your urine.  You have a fever or persistent symptoms for more than 2-3 days.  You have a  fever and your symptoms suddenly get worse. MAKE SURE YOU:   Understand these instructions.  Will watch your condition.  Will get help right away if you are not doing well or get worse. Document Released: 06/26/2005 Document Revised: 01/28/2012 Document Reviewed: 12/18/2011 [facility] Patient Information 2015 [place], [place]. This information is not intended to replace advice given to you by your health care provider. Make sure you discuss any questions you have with your health care provider.

## 2014-05-13 NOTE — ED Notes (Signed)
Pt c/o left flank pain, urinary retention that started this morning. Pt states that feels similar to when he had kidney stones in the past. -

## 2014-05-17 DIAGNOSIS — N200 Calculus of kidney: Secondary | ICD-10-CM | POA: Diagnosis not present

## 2014-05-18 ENCOUNTER — Other Ambulatory Visit: Payer: Self-pay | Admitting: Nurse Practitioner

## 2014-05-18 ENCOUNTER — Ambulatory Visit
Admission: RE | Admit: 2014-05-18 | Discharge: 2014-05-18 | Disposition: A | Discharge status: Home / Self Care | Payer: Medicare Other | Source: Ambulatory Visit | Attending: Nurse Practitioner | Admitting: Nurse Practitioner

## 2014-05-18 DIAGNOSIS — M1612 Unilateral primary osteoarthritis, left hip: Secondary | ICD-10-CM | POA: Diagnosis not present

## 2014-05-18 DIAGNOSIS — M25552 Pain in left hip: Secondary | ICD-10-CM

## 2014-05-18 DIAGNOSIS — M545 Low back pain: Secondary | ICD-10-CM | POA: Diagnosis not present

## 2014-05-18 DIAGNOSIS — M5136 Other intervertebral disc degeneration, lumbar region: Secondary | ICD-10-CM | POA: Diagnosis not present

## 2014-05-18 DIAGNOSIS — N200 Calculus of kidney: Secondary | ICD-10-CM | POA: Diagnosis not present

## 2014-05-18 DIAGNOSIS — M4316 Spondylolisthesis, lumbar region: Secondary | ICD-10-CM | POA: Diagnosis not present

## 2014-05-18 DIAGNOSIS — M4854XA Collapsed vertebra, not elsewhere classified, thoracic region, initial encounter for fracture: Secondary | ICD-10-CM | POA: Diagnosis not present

## 2014-05-18 IMAGING — CR DG HIP (WITH OR WITHOUT PELVIS) 2-3V*L*
2 series · 2 of 2 positions shown · non-contrast
Comparison: None.

CLINICAL DATA: Posterior left hip pain for 1 week.

EXAM:
LEFT HIP - COMPLETE 2+ VIEW

[t hip ap left]
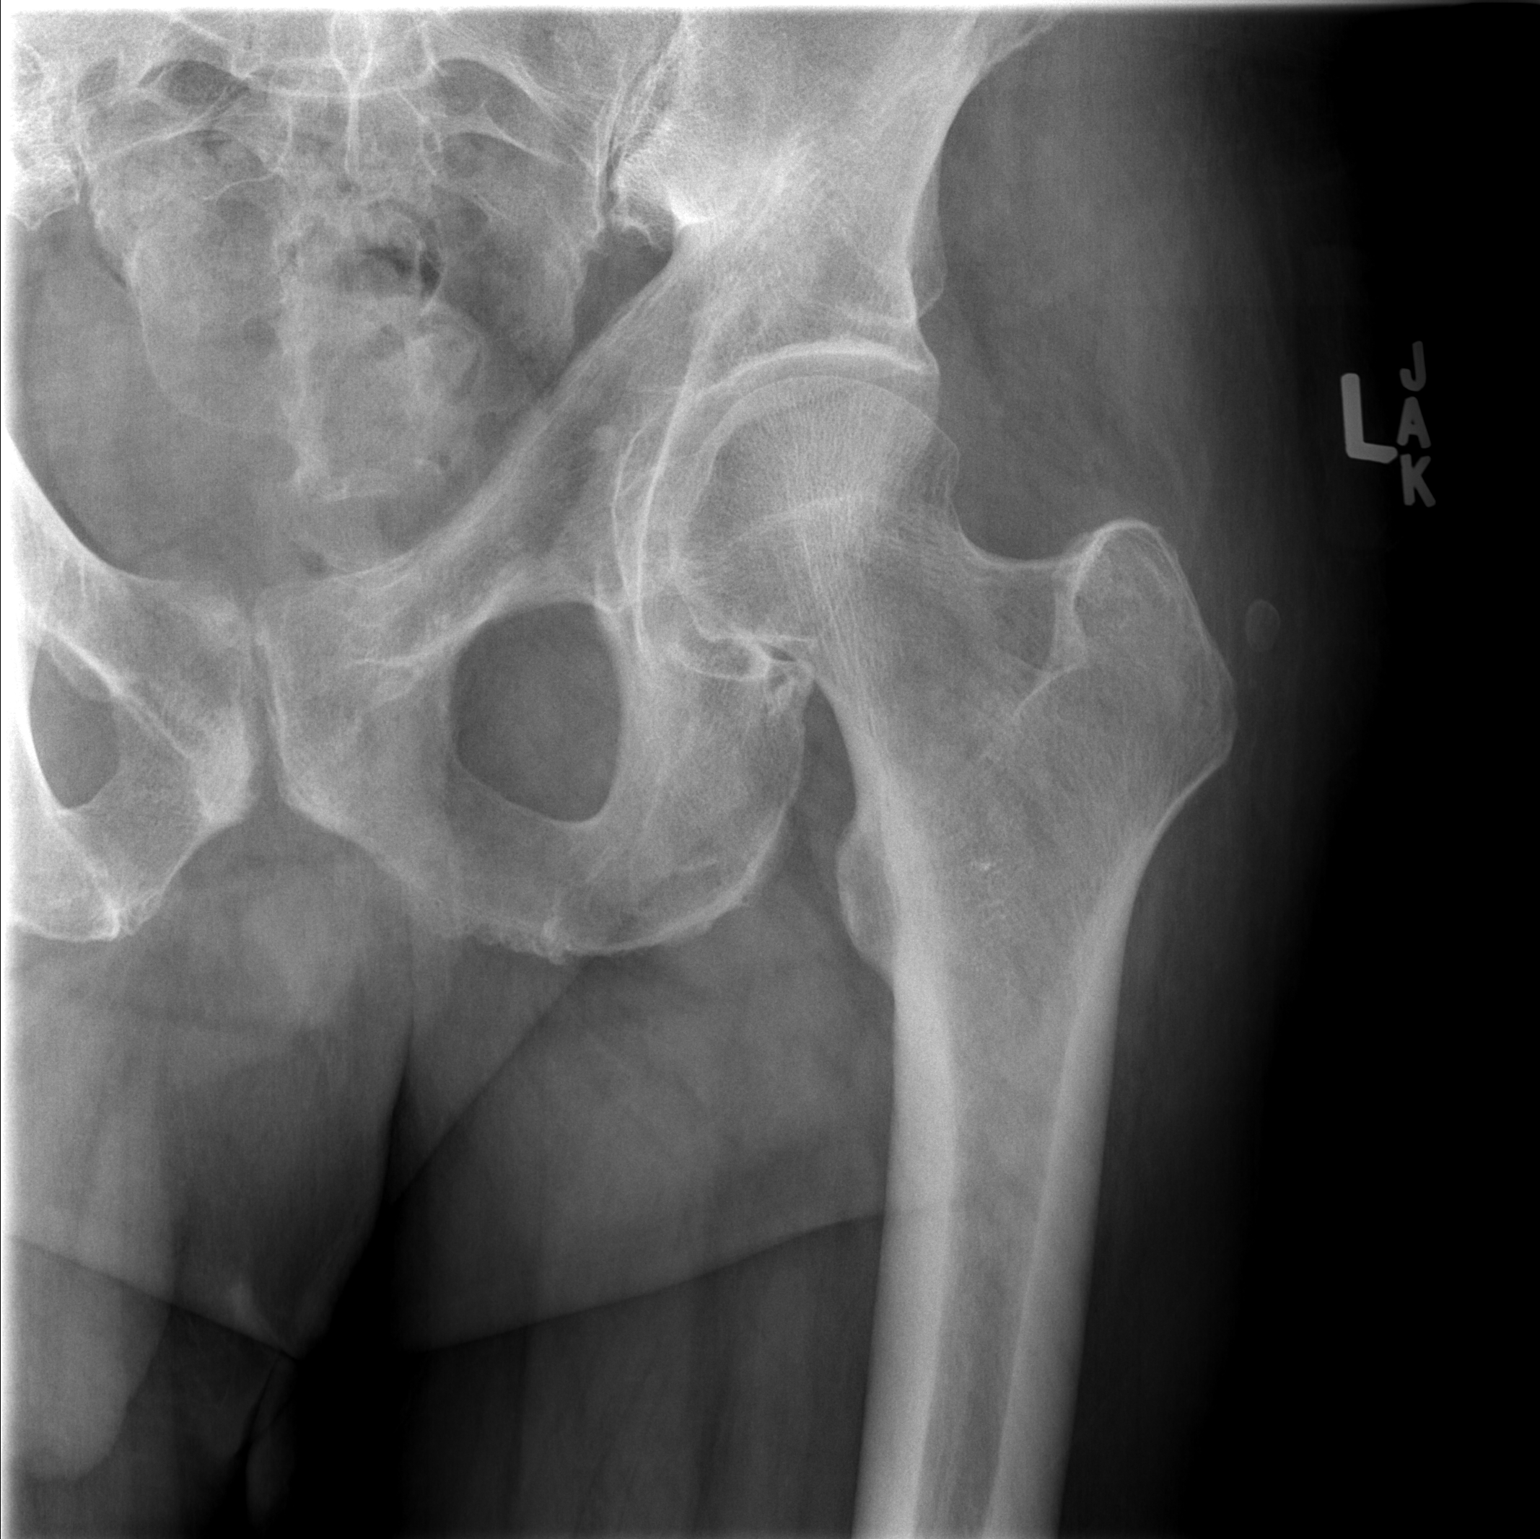

[t hip frog leg left]
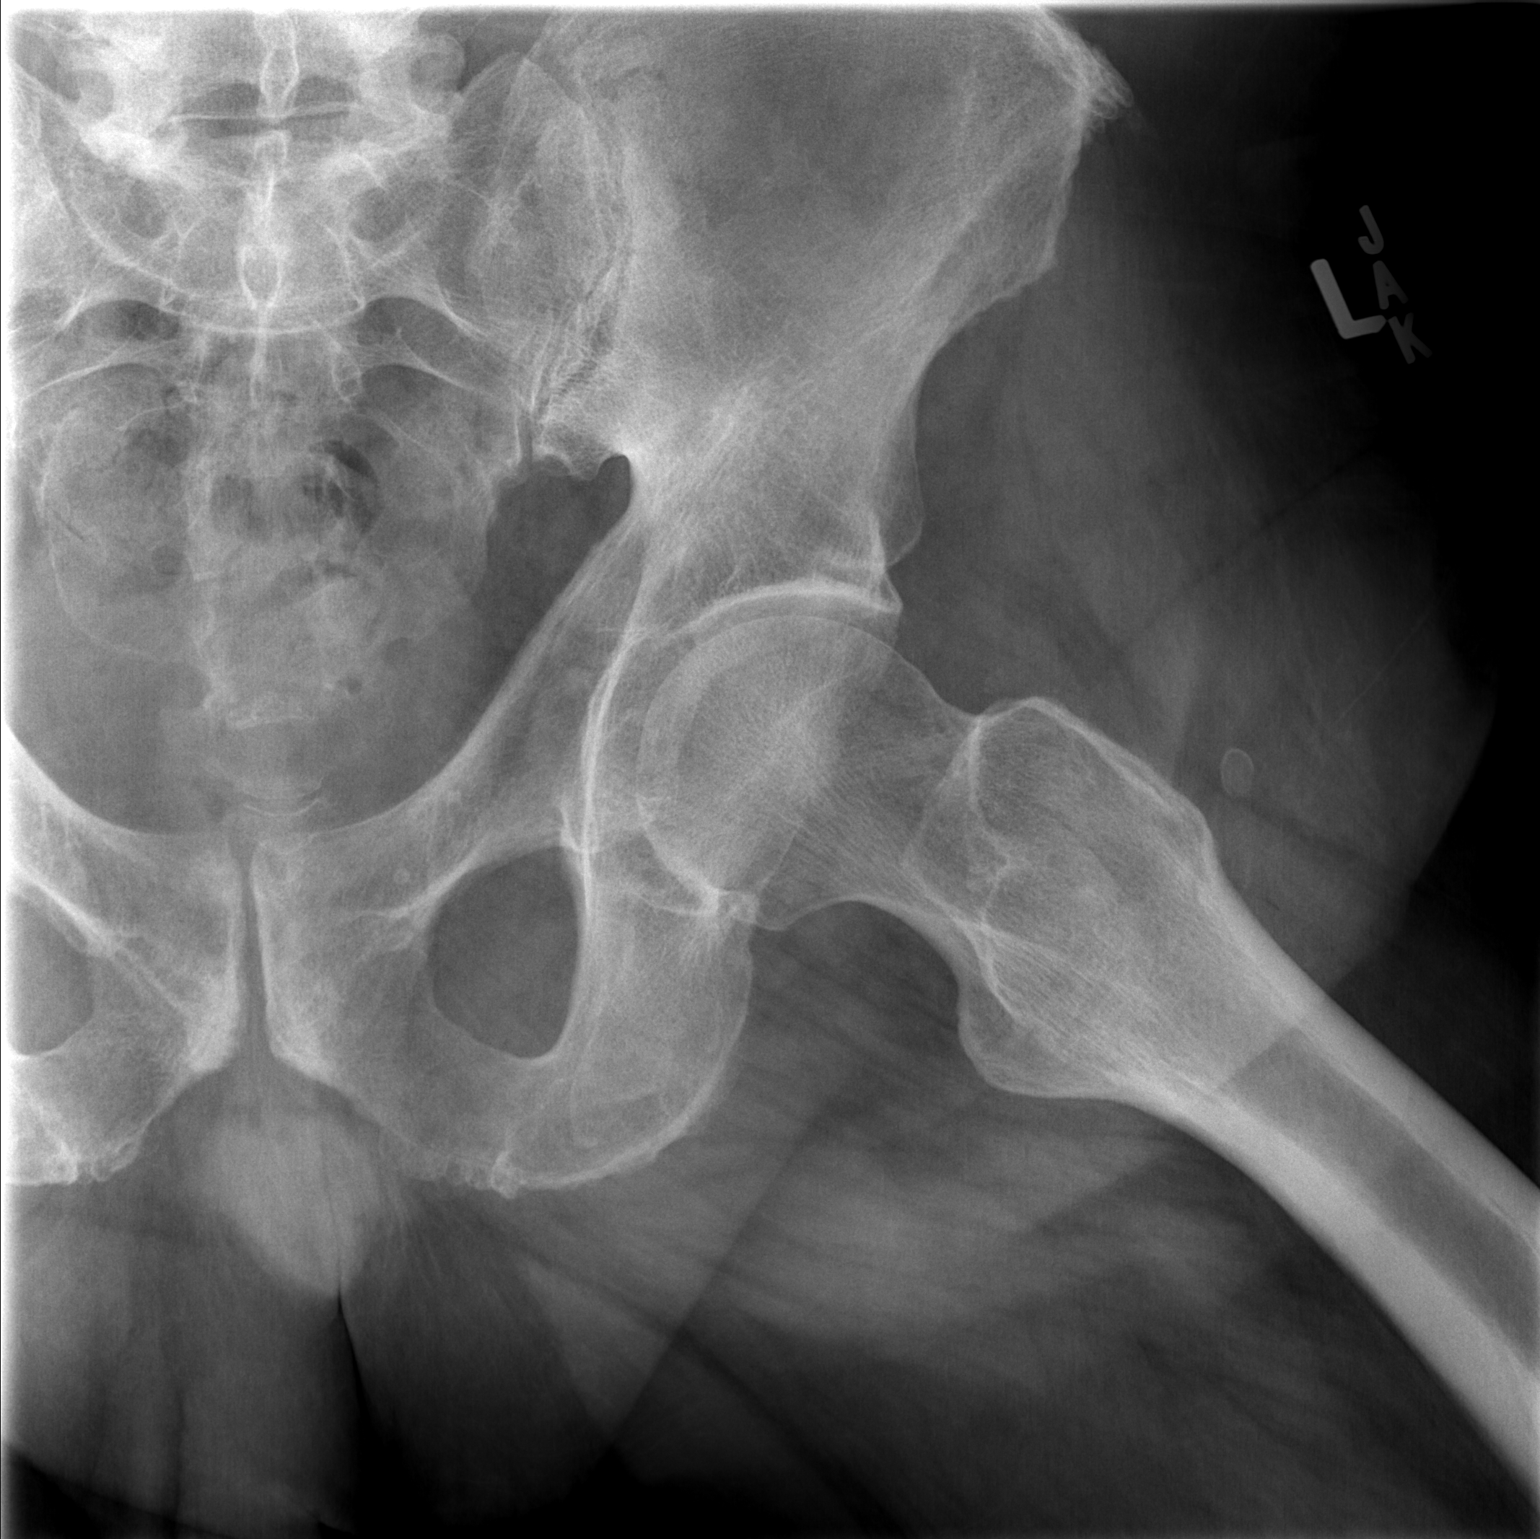

[2 of 2 positions shown; findings below may reference images not displayed]

FINDINGS: The left hip is located. No acute fracture or plain film evidence of
avascular necrosis. Mild degenerative changes. The pubic symphysis
and visualized left SI joint appear normal. No left-sided pubic rami
fractures.
IMPRESSION: Mild degenerative changes but no acute bony findings.

## 2014-05-18 IMAGING — CR DG LUMBAR SPINE 2-3V
3 series · 3 of 3 positions shown · non-contrast
Comparison: CT [DATE]

CLINICAL DATA: Left-sided low back pain for 1 week. No known
injury.

EXAM:
LUMBAR SPINE - 2-3 VIEW

[t l-spine a.p.]
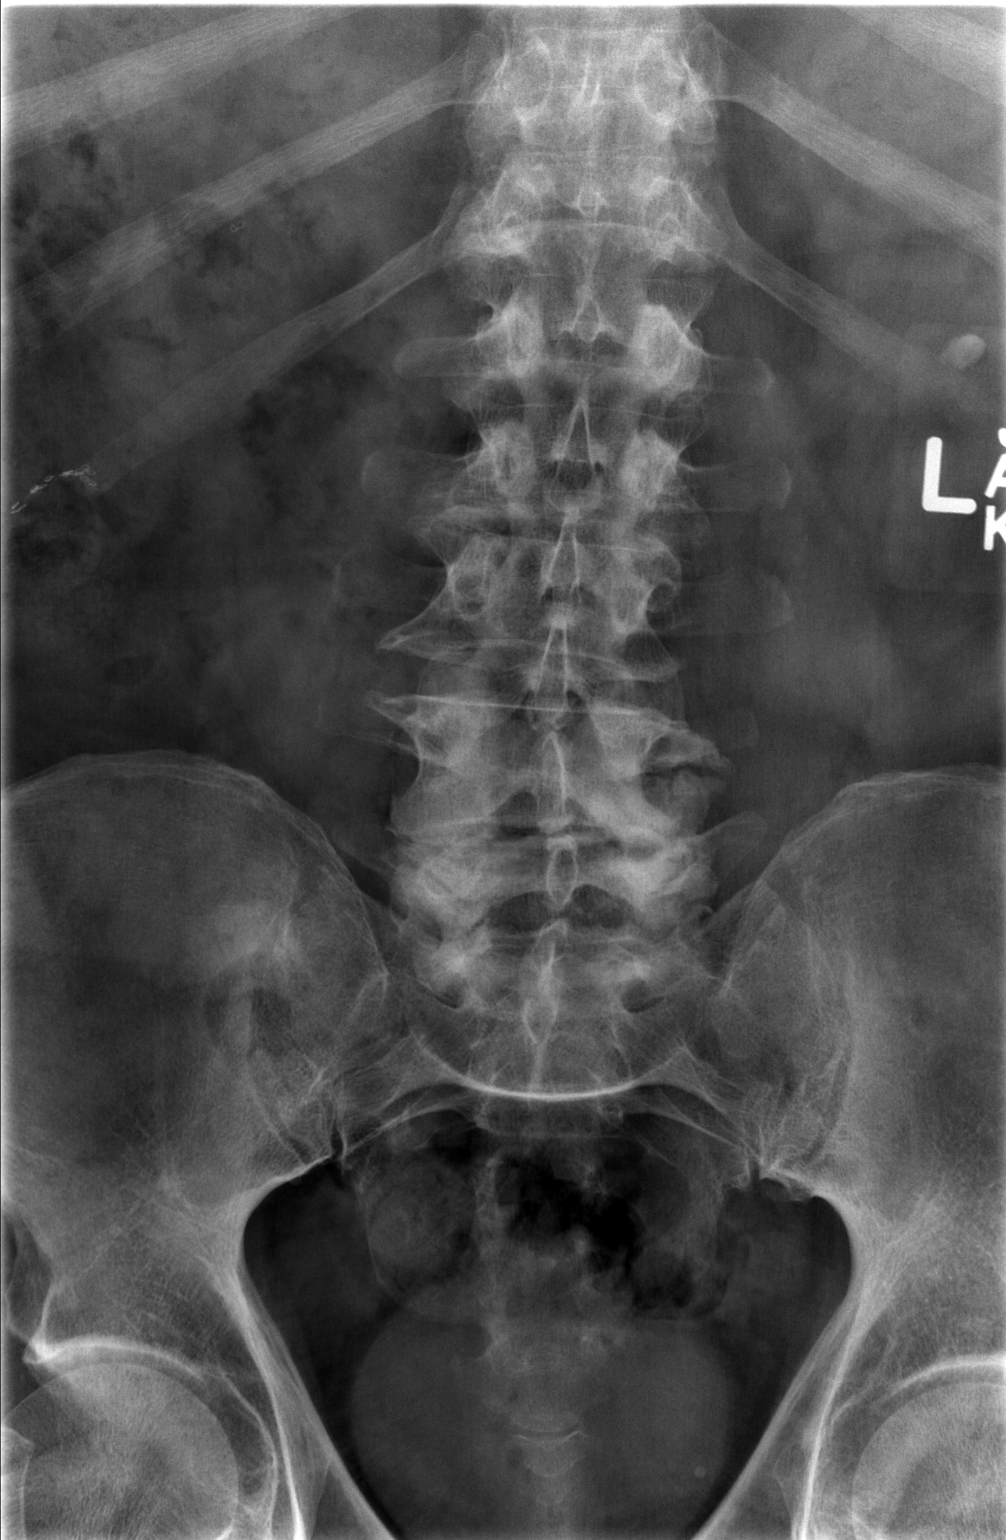

[t l-spine lat]
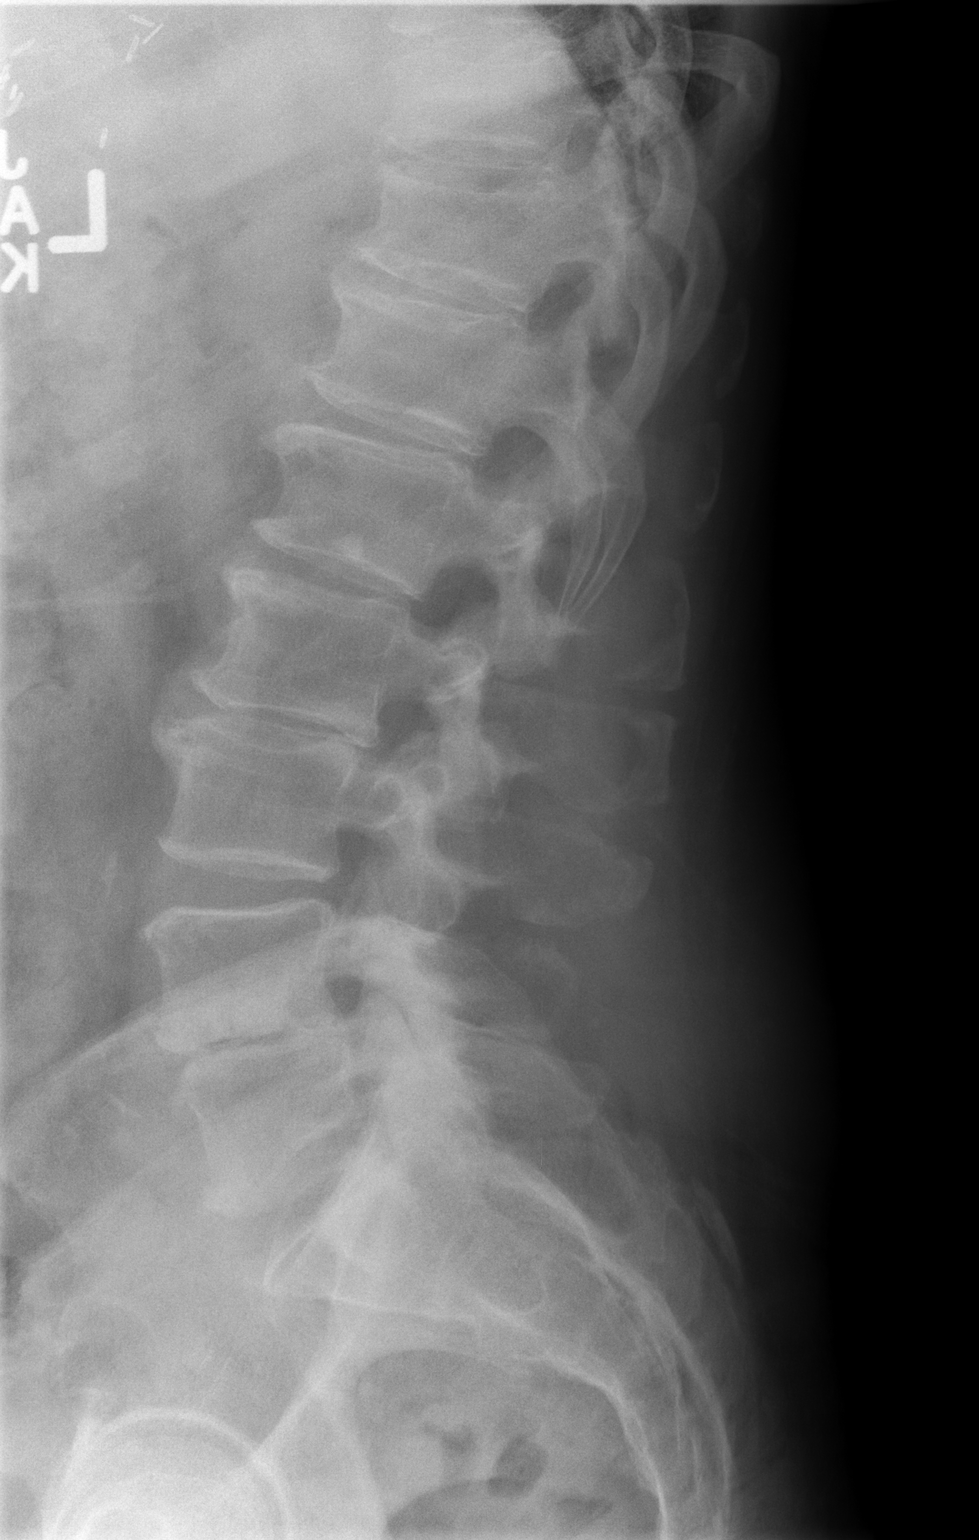

[t l-spine l5-s1 spot]
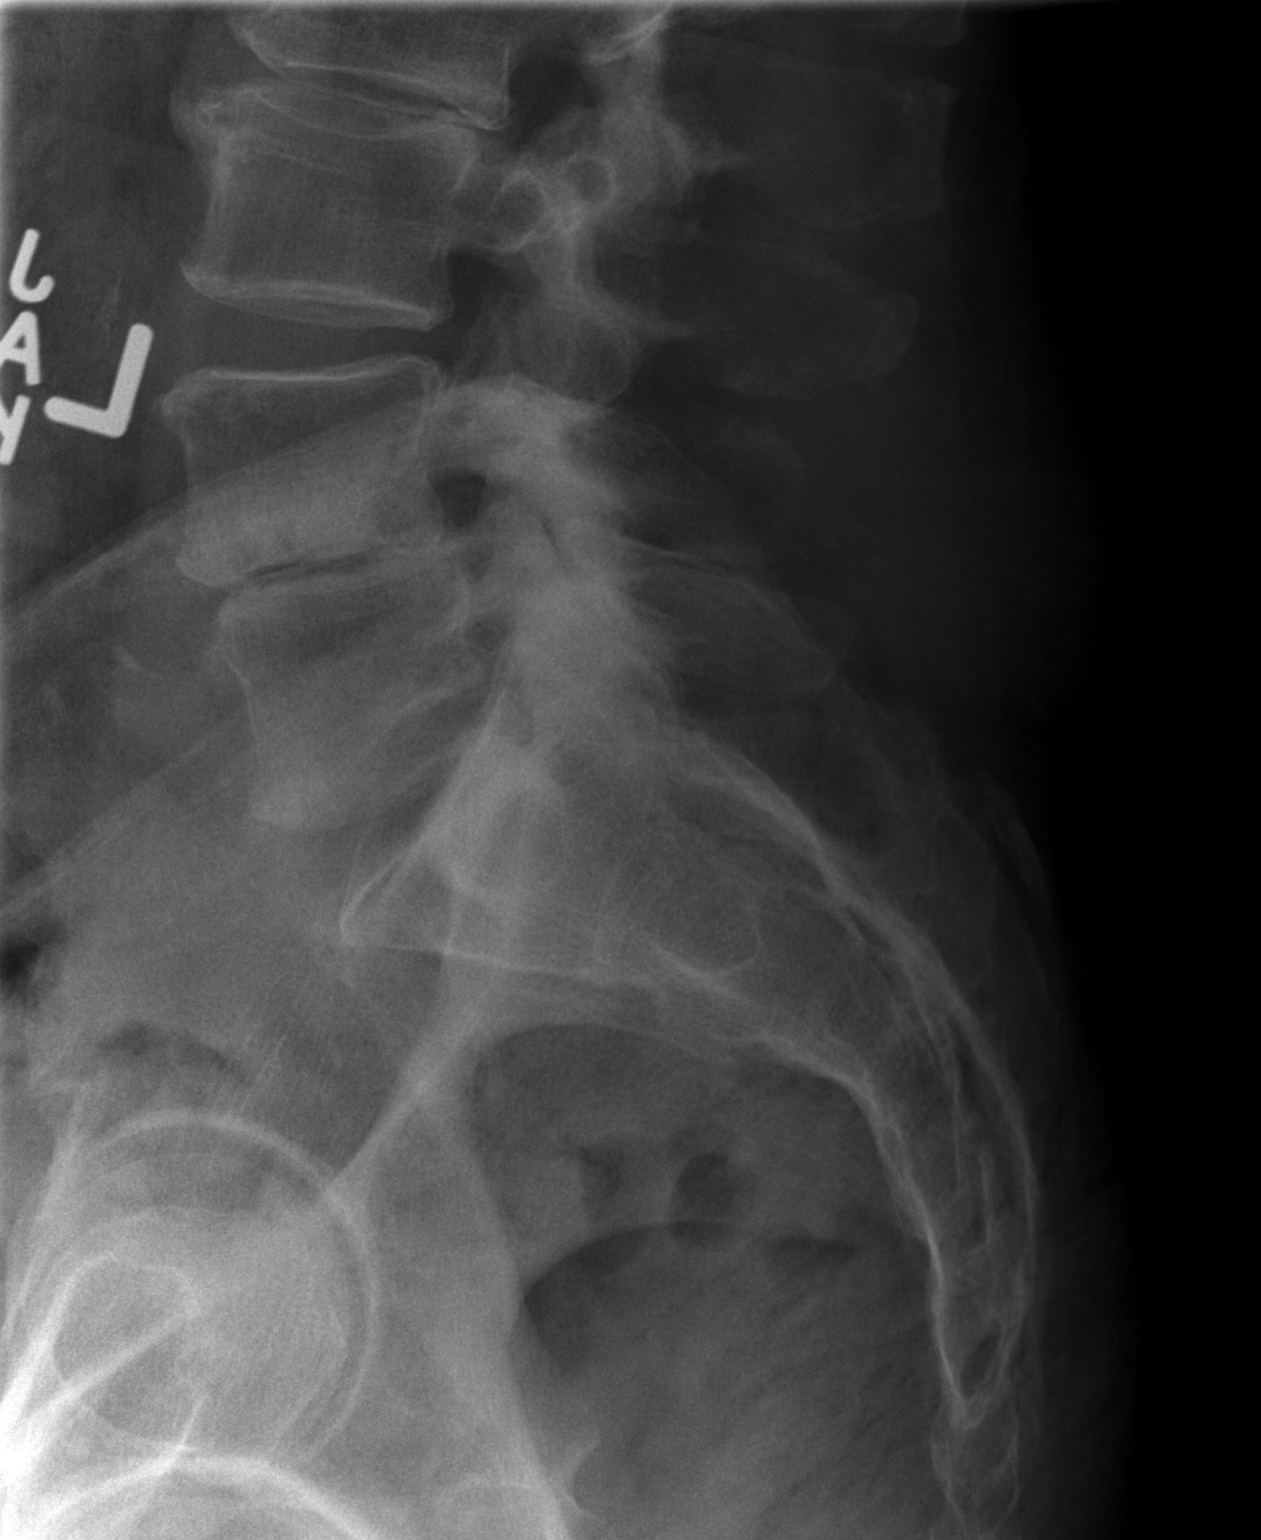

[3 of 3 positions shown; findings below may reference images not displayed]

FINDINGS: Surgical clips right upper quadrant. Left nephrolithiasis cannot be
excluded. Diffuse severe degenerative changes lumbar spine.
Prominent disc space loss at L4-L5 noted. 4 mm anterolisthesis L4 on
L5. Mild T11 compression fracture.
IMPRESSION: 1. Severe diffuse degenerative change lumbar spinal severe displaced
loss L4-L5. There is mild 4 mm of listhesis L4-L5.
2. Mild T11 compression fracture.  This may be acute.
3. Left nephrolithiasis.

## 2014-05-31 DIAGNOSIS — M47816 Spondylosis without myelopathy or radiculopathy, lumbar region: Secondary | ICD-10-CM | POA: Diagnosis not present

## 2014-05-31 DIAGNOSIS — M4316 Spondylolisthesis, lumbar region: Secondary | ICD-10-CM | POA: Diagnosis not present

## 2014-06-14 DIAGNOSIS — Z77018 Contact with and (suspected) exposure to other hazardous metals: Secondary | ICD-10-CM | POA: Diagnosis not present

## 2014-06-14 DIAGNOSIS — M4806 Spinal stenosis, lumbar region: Secondary | ICD-10-CM | POA: Diagnosis not present

## 2014-06-14 DIAGNOSIS — Z01818 Encounter for other preprocedural examination: Secondary | ICD-10-CM | POA: Diagnosis not present

## 2014-06-14 DIAGNOSIS — M5386 Other specified dorsopathies, lumbar region: Secondary | ICD-10-CM | POA: Diagnosis not present

## 2014-06-14 DIAGNOSIS — M47816 Spondylosis without myelopathy or radiculopathy, lumbar region: Secondary | ICD-10-CM | POA: Diagnosis not present

## 2014-06-14 DIAGNOSIS — M5136 Other intervertebral disc degeneration, lumbar region: Secondary | ICD-10-CM | POA: Diagnosis not present

## 2014-06-14 DIAGNOSIS — M4186 Other forms of scoliosis, lumbar region: Secondary | ICD-10-CM | POA: Diagnosis not present

## 2014-06-14 DIAGNOSIS — M5116 Intervertebral disc disorders with radiculopathy, lumbar region: Secondary | ICD-10-CM | POA: Diagnosis not present

## 2014-07-10 ENCOUNTER — Encounter: Payer: Self-pay | Admitting: Internal Medicine

## 2014-07-13 DIAGNOSIS — M4316 Spondylolisthesis, lumbar region: Secondary | ICD-10-CM | POA: Diagnosis not present

## 2014-07-13 DIAGNOSIS — M47816 Spondylosis without myelopathy or radiculopathy, lumbar region: Secondary | ICD-10-CM | POA: Diagnosis not present

## 2014-08-21 DIAGNOSIS — N529 Male erectile dysfunction, unspecified: Secondary | ICD-10-CM | POA: Diagnosis not present

## 2014-08-21 DIAGNOSIS — F322 Major depressive disorder, single episode, severe without psychotic features: Secondary | ICD-10-CM | POA: Diagnosis not present

## 2014-08-21 DIAGNOSIS — K219 Gastro-esophageal reflux disease without esophagitis: Secondary | ICD-10-CM | POA: Diagnosis not present

## 2014-08-21 DIAGNOSIS — M109 Gout, unspecified: Secondary | ICD-10-CM | POA: Diagnosis not present

## 2014-08-21 DIAGNOSIS — I10 Essential (primary) hypertension: Secondary | ICD-10-CM | POA: Diagnosis not present

## 2014-08-21 DIAGNOSIS — E782 Mixed hyperlipidemia: Secondary | ICD-10-CM | POA: Diagnosis not present

## 2014-10-05 ENCOUNTER — Encounter: Payer: Self-pay | Admitting: Internal Medicine

## 2015-03-27 ENCOUNTER — Encounter: Payer: Self-pay | Admitting: Internal Medicine

## 2015-03-27 DIAGNOSIS — N529 Male erectile dysfunction, unspecified: Secondary | ICD-10-CM | POA: Diagnosis not present

## 2015-03-27 DIAGNOSIS — Z23 Encounter for immunization: Secondary | ICD-10-CM | POA: Diagnosis not present

## 2015-03-27 DIAGNOSIS — R7309 Other abnormal glucose: Secondary | ICD-10-CM | POA: Diagnosis not present

## 2015-03-27 DIAGNOSIS — E782 Mixed hyperlipidemia: Secondary | ICD-10-CM | POA: Diagnosis not present

## 2015-03-27 DIAGNOSIS — Z0000 Encounter for general adult medical examination without abnormal findings: Secondary | ICD-10-CM | POA: Diagnosis not present

## 2015-03-27 DIAGNOSIS — M519 Unspecified thoracic, thoracolumbar and lumbosacral intervertebral disc disorder: Secondary | ICD-10-CM | POA: Diagnosis not present

## 2015-03-27 DIAGNOSIS — F322 Major depressive disorder, single episode, severe without psychotic features: Secondary | ICD-10-CM | POA: Diagnosis not present

## 2015-03-27 DIAGNOSIS — Z1389 Encounter for screening for other disorder: Secondary | ICD-10-CM | POA: Diagnosis not present

## 2015-03-27 DIAGNOSIS — I10 Essential (primary) hypertension: Secondary | ICD-10-CM | POA: Diagnosis not present

## 2015-03-27 DIAGNOSIS — M109 Gout, unspecified: Secondary | ICD-10-CM | POA: Diagnosis not present

## 2015-03-27 DIAGNOSIS — N400 Enlarged prostate without lower urinary tract symptoms: Secondary | ICD-10-CM | POA: Diagnosis not present

## 2015-04-16 DIAGNOSIS — L570 Actinic keratosis: Secondary | ICD-10-CM | POA: Diagnosis not present

## 2015-05-30 ENCOUNTER — Telehealth: Payer: Self-pay | Admitting: Internal Medicine

## 2015-05-30 ENCOUNTER — Ambulatory Visit (AMBULATORY_SURGERY_CENTER): Payer: Self-pay | Admitting: *Deleted

## 2015-05-30 VITALS — Ht 69.0 in | Wt 203.0 lb

## 2015-05-30 DIAGNOSIS — Z86010 Personal history of colonic polyps: Secondary | ICD-10-CM

## 2015-05-30 MED ORDER — NA SULFATE-K SULFATE-MG SULF 17.5-3.13-1.6 GM/177ML PO SOLN: 1.0000 | Freq: Once | ORAL | Status: DC

## 2015-05-30 NOTE — Progress Notes (Signed)
No egg or soy allergy. No anesthesia problems.  No home O2.  No diet meds.  

## 2015-05-30 NOTE — Telephone Encounter (Signed)
Called pt, pt is unaware of medication problem, offered to give pt a sample prep if he can pick it up before his procedure, pt will pick up prep from front desk-adm

## 2015-06-06 ENCOUNTER — Ambulatory Visit (AMBULATORY_SURGERY_CENTER): Payer: Medicare Other | Admitting: Internal Medicine

## 2015-06-06 ENCOUNTER — Encounter: Payer: Self-pay | Admitting: Internal Medicine

## 2015-06-06 VITALS — BP 159/91 | HR 67 | Temp 97.5°F | Resp 13 | Ht 69.0 in | Wt 203.0 lb

## 2015-06-06 DIAGNOSIS — D124 Benign neoplasm of descending colon: Secondary | ICD-10-CM

## 2015-06-06 DIAGNOSIS — D123 Benign neoplasm of transverse colon: Secondary | ICD-10-CM

## 2015-06-06 DIAGNOSIS — D120 Benign neoplasm of cecum: Secondary | ICD-10-CM

## 2015-06-06 DIAGNOSIS — F329 Major depressive disorder, single episode, unspecified: Secondary | ICD-10-CM | POA: Diagnosis not present

## 2015-06-06 DIAGNOSIS — Z86010 Personal history of colonic polyps: Secondary | ICD-10-CM | POA: Diagnosis not present

## 2015-06-06 DIAGNOSIS — E669 Obesity, unspecified: Secondary | ICD-10-CM | POA: Diagnosis not present

## 2015-06-06 DIAGNOSIS — I10 Essential (primary) hypertension: Secondary | ICD-10-CM | POA: Diagnosis not present

## 2015-06-06 MED ORDER — SODIUM CHLORIDE 0.9 % IV SOLN: 500.0000 mL | INTRAVENOUS | Status: DC

## 2015-06-06 NOTE — Progress Notes (Signed)
Called to room to assist during endoscopic procedure.  Patient ID and intended procedure confirmed with present staff. Received instructions for my participation in the procedure from the performing physician.  

## 2015-06-06 NOTE — Progress Notes (Signed)
Report to PACU, RN, vss, BBS= Clear.  

## 2015-06-06 NOTE — Op Note (Signed)
[place]  [employer]. [place], [address]   COLONOSCOPY PROCEDURE REPORT  PATIENT: Tyrone [name], Tyrone [name]  MR#: [id number] BIRTHDATE: [birth year] , 69  yrs. old GENDER: male ENDOSCOPIST: Eustace Quail, MD REFERRED [id number] Program Recall PROCEDURE DATE:  06/06/2015 PROCEDURE:   Colonoscopy, surveillance and Colonoscopy with snare polypectomy X7 First Screening Colonoscopy - Avg.  risk and is 50 yrs.  old or older - No.  Prior Negative Screening - Now for repeat screening. N/A  History of Adenoma - Now for follow-up colonoscopy & has been > or = to 3 yrs.  Yes hx of adenoma.  Has been 3 or more years since last colonoscopy.  Polyps removed today? Yes ASA CLASS:   Class II INDICATIONS:Surveillance due to prior colonic neoplasia and PH Colon Adenoma.. Index examination 2004 (tubular adenoma), 2007 (negative), 2011 (tubular adenoma) MEDICATIONS: Monitored anesthesia care and Propofol 430 mg IV  DESCRIPTION OF PROCEDURE:   After the risks benefits and alternatives of the procedure were thoroughly explained, informed consent was obtained.  The digital rectal exam revealed no abnormalities of the rectum.   The LB [id number]  endoscope was introduced through the anus and advanced to the cecum, which was identified by both the appendix and ileocecal valve. No adverse events experienced.   The quality of the prep was good.  (Suprep was used)  The instrument was then slowly withdrawn as the colon was fully examined. Estimated blood loss is zero unless otherwise noted in this procedure report.      COLON FINDINGS: Seven polyps ranging from 1 to 33mm in size were found at the cecum, in the transverse colon, and descending colon. A polypectomy was performed with a cold snare.  The resection was complete, the polyp tissue was completely retrieved and sent to histology.   The examination was otherwise normal.  Retroflexed views revealed internal hemorrhoids.  The time to cecum = 2.7 Withdrawal time = 18.3   The scope was withdrawn and the procedure completed. COMPLICATIONS: There were no immediate complications.  ENDOSCOPIC IMPRESSION: 1.   Seven polyps were found in the colon; polypectomy was performed with a cold snare 2.   The examination was otherwise normal  RECOMMENDATIONS: 1. Repeat Colonoscopy in 3 years (multiple polyps).  eSigned:  Eustace Quail, MD 06/06/2015 9:12 AM   cc: The Patient and Wenda Low MD   PATIENT NAME:  Tyrone [name], Tyrone [name] MR#: [id number]

## 2015-06-06 NOTE — Progress Notes (Signed)
No problems noted in the recovery room. maw 

## 2015-06-06 NOTE — Patient Instructions (Signed)
YOU HAD AN ENDOSCOPIC PROCEDURE TODAY AT THE [facility]:   Refer to the procedure report that was given to you for any specific questions about what was found during the examination.  If the procedure report does not answer your questions, please call your gastroenterologist to clarify.  If you requested that your care partner not be given the details of your procedure findings, then the procedure report has been included in a sealed envelope for you to review at your convenience later.  YOU SHOULD EXPECT: Some feelings of bloating in the abdomen. Passage of more gas than usual.  Walking can help get rid of the air that was put into your GI tract during the procedure and reduce the bloating. If you had a lower endoscopy (such as a colonoscopy or flexible sigmoidoscopy) you may notice spotting of blood in your stool or on the toilet paper. If you underwent a bowel prep for your procedure, you may not have a normal bowel movement for a few days.  Please Note:  You might notice some irritation and congestion in your nose or some drainage.  This is from the oxygen used during your procedure.  There is no need for concern and it should clear up in a day or so.  SYMPTOMS TO REPORT IMMEDIATELY:   Following lower endoscopy (colonoscopy or flexible sigmoidoscopy):  Excessive amounts of blood in the stool  Significant tenderness or worsening of abdominal pains  Swelling of the abdomen that is new, acute  Fever of 100F or higher   For urgent or emergent issues, a gastroenterologist can be reached at any hour by calling [phone].   DIET: Your first meal following the procedure should be a small meal and then it is ok to progress to your normal diet. Heavy or fried foods are harder to digest and may make you feel nauseous or bloated.  Likewise, meals heavy in dairy and vegetables can increase bloating.  Drink plenty of fluids but you should avoid alcoholic beverages for 24  hours.  ACTIVITY:  You should plan to take it easy for the rest of today and you should NOT DRIVE or use heavy machinery until tomorrow (because of the sedation medicines used during the test).    FOLLOW UP: Our staff will call the number listed on your records the next business day following your procedure to check on you and address any questions or concerns that you may have regarding the information given to you following your procedure. If we do not reach you, we will leave a message.  However, if you are feeling well and you are not experiencing any problems, there is no need to return our call.  We will assume that you have returned to your regular daily activities without incident.  If any biopsies were taken you will be contacted by phone or by letter within the next 1-3 weeks.  Please call us at [phone] if you have not heard about the biopsies in 3 weeks.    SIGNATURES/CONFIDENTIALITY: You and/or your care partner have signed paperwork which will be entered into your electronic medical record.  These signatures attest to the fact that that the information above on your After Visit Summary has been reviewed and is understood.  Full responsibility of the confidentiality of this discharge information lies with you and/or your care-partner.   Handouts were given to your care partner on polyps, hemorrhoids, and a high fiber diet with liberal fluid intake. You may resume your   current medications today. Await biopsy results. Please call if any questions or concerns.   

## 2015-06-07 ENCOUNTER — Telehealth: Payer: Self-pay

## 2015-06-07 NOTE — Telephone Encounter (Signed)
  Follow up Call-  Call back number 06/06/2015  Post procedure Call Back phone  # [phone]  Permission to leave phone message Yes     Patient questions:  Do you have a fever, pain , or abdominal swelling? No. Pain Score  0 *  Have you tolerated food without any problems? Yes.    Have you been able to return to your normal activities? Yes.    Do you have any questions about your discharge instructions: Diet   No. Medications  No. Follow up visit  No.  Do you have questions or concerns about your Care? No.  Actions: * If pain score is 4 or above: No action needed, pain <4.

## 2015-06-11 ENCOUNTER — Encounter: Payer: Self-pay | Admitting: Internal Medicine

## 2015-09-26 DIAGNOSIS — E782 Mixed hyperlipidemia: Secondary | ICD-10-CM | POA: Diagnosis not present

## 2015-09-26 DIAGNOSIS — F322 Major depressive disorder, single episode, severe without psychotic features: Secondary | ICD-10-CM | POA: Diagnosis not present

## 2015-09-26 DIAGNOSIS — I10 Essential (primary) hypertension: Secondary | ICD-10-CM | POA: Diagnosis not present

## 2015-09-26 DIAGNOSIS — R7303 Prediabetes: Secondary | ICD-10-CM | POA: Diagnosis not present

## 2015-09-26 DIAGNOSIS — M109 Gout, unspecified: Secondary | ICD-10-CM | POA: Diagnosis not present

## 2016-05-06 DIAGNOSIS — F322 Major depressive disorder, single episode, severe without psychotic features: Secondary | ICD-10-CM | POA: Diagnosis not present

## 2016-05-06 DIAGNOSIS — E782 Mixed hyperlipidemia: Secondary | ICD-10-CM | POA: Diagnosis not present

## 2016-05-06 DIAGNOSIS — Z1389 Encounter for screening for other disorder: Secondary | ICD-10-CM | POA: Diagnosis not present

## 2016-05-06 DIAGNOSIS — R7303 Prediabetes: Secondary | ICD-10-CM | POA: Diagnosis not present

## 2016-05-06 DIAGNOSIS — Z23 Encounter for immunization: Secondary | ICD-10-CM | POA: Diagnosis not present

## 2016-05-06 DIAGNOSIS — M519 Unspecified thoracic, thoracolumbar and lumbosacral intervertebral disc disorder: Secondary | ICD-10-CM | POA: Diagnosis not present

## 2016-05-06 DIAGNOSIS — Z0000 Encounter for general adult medical examination without abnormal findings: Secondary | ICD-10-CM | POA: Diagnosis not present

## 2016-05-06 DIAGNOSIS — I10 Essential (primary) hypertension: Secondary | ICD-10-CM | POA: Diagnosis not present

## 2016-05-06 DIAGNOSIS — N400 Enlarged prostate without lower urinary tract symptoms: Secondary | ICD-10-CM | POA: Diagnosis not present

## 2016-05-06 DIAGNOSIS — M109 Gout, unspecified: Secondary | ICD-10-CM | POA: Diagnosis not present

## 2016-05-06 DIAGNOSIS — Z1159 Encounter for screening for other viral diseases: Secondary | ICD-10-CM | POA: Diagnosis not present

## 2016-05-30 DIAGNOSIS — I10 Essential (primary) hypertension: Secondary | ICD-10-CM | POA: Diagnosis not present

## 2016-07-30 DIAGNOSIS — M549 Dorsalgia, unspecified: Secondary | ICD-10-CM | POA: Diagnosis not present

## 2016-09-08 DIAGNOSIS — J40 Bronchitis, not specified as acute or chronic: Secondary | ICD-10-CM | POA: Diagnosis not present

## 2016-09-08 DIAGNOSIS — R05 Cough: Secondary | ICD-10-CM | POA: Diagnosis not present

## 2016-10-27 DIAGNOSIS — F322 Major depressive disorder, single episode, severe without psychotic features: Secondary | ICD-10-CM | POA: Diagnosis not present

## 2016-10-27 DIAGNOSIS — M109 Gout, unspecified: Secondary | ICD-10-CM | POA: Diagnosis not present

## 2016-10-27 DIAGNOSIS — I10 Essential (primary) hypertension: Secondary | ICD-10-CM | POA: Diagnosis not present

## 2016-12-10 DIAGNOSIS — D485 Neoplasm of uncertain behavior of skin: Secondary | ICD-10-CM | POA: Diagnosis not present

## 2016-12-10 DIAGNOSIS — L280 Lichen simplex chronicus: Secondary | ICD-10-CM | POA: Diagnosis not present

## 2017-01-13 DIAGNOSIS — L280 Lichen simplex chronicus: Secondary | ICD-10-CM | POA: Diagnosis not present

## 2017-02-23 DIAGNOSIS — L280 Lichen simplex chronicus: Secondary | ICD-10-CM | POA: Diagnosis not present

## 2017-03-23 DIAGNOSIS — L280 Lichen simplex chronicus: Secondary | ICD-10-CM | POA: Diagnosis not present

## 2017-04-29 DIAGNOSIS — M109 Gout, unspecified: Secondary | ICD-10-CM | POA: Diagnosis not present

## 2017-04-29 DIAGNOSIS — Z1389 Encounter for screening for other disorder: Secondary | ICD-10-CM | POA: Diagnosis not present

## 2017-04-29 DIAGNOSIS — R7303 Prediabetes: Secondary | ICD-10-CM | POA: Diagnosis not present

## 2017-04-29 DIAGNOSIS — Z6831 Body mass index (BMI) 31.0-31.9, adult: Secondary | ICD-10-CM | POA: Diagnosis not present

## 2017-04-29 DIAGNOSIS — I10 Essential (primary) hypertension: Secondary | ICD-10-CM | POA: Diagnosis not present

## 2017-04-29 DIAGNOSIS — F322 Major depressive disorder, single episode, severe without psychotic features: Secondary | ICD-10-CM | POA: Diagnosis not present

## 2017-04-29 DIAGNOSIS — M519 Unspecified thoracic, thoracolumbar and lumbosacral intervertebral disc disorder: Secondary | ICD-10-CM | POA: Diagnosis not present

## 2017-04-29 DIAGNOSIS — K219 Gastro-esophageal reflux disease without esophagitis: Secondary | ICD-10-CM | POA: Diagnosis not present

## 2017-04-29 DIAGNOSIS — Z23 Encounter for immunization: Secondary | ICD-10-CM | POA: Diagnosis not present

## 2017-04-29 DIAGNOSIS — Z0000 Encounter for general adult medical examination without abnormal findings: Secondary | ICD-10-CM | POA: Diagnosis not present

## 2017-04-29 DIAGNOSIS — R21 Rash and other nonspecific skin eruption: Secondary | ICD-10-CM | POA: Diagnosis not present

## 2017-04-29 DIAGNOSIS — E782 Mixed hyperlipidemia: Secondary | ICD-10-CM | POA: Diagnosis not present

## 2017-04-29 DIAGNOSIS — N400 Enlarged prostate without lower urinary tract symptoms: Secondary | ICD-10-CM | POA: Diagnosis not present

## 2017-04-29 DIAGNOSIS — Z7189 Other specified counseling: Secondary | ICD-10-CM | POA: Diagnosis not present

## 2017-06-01 DIAGNOSIS — E782 Mixed hyperlipidemia: Secondary | ICD-10-CM | POA: Diagnosis not present

## 2017-06-03 DIAGNOSIS — L0100 Impetigo, unspecified: Secondary | ICD-10-CM | POA: Diagnosis not present

## 2017-06-03 DIAGNOSIS — L570 Actinic keratosis: Secondary | ICD-10-CM | POA: Diagnosis not present

## 2017-06-03 DIAGNOSIS — L309 Dermatitis, unspecified: Secondary | ICD-10-CM | POA: Diagnosis not present

## 2017-06-03 DIAGNOSIS — L988 Other specified disorders of the skin and subcutaneous tissue: Secondary | ICD-10-CM | POA: Diagnosis not present

## 2017-06-03 DIAGNOSIS — L299 Pruritus, unspecified: Secondary | ICD-10-CM | POA: Diagnosis not present

## 2017-06-03 DIAGNOSIS — L308 Other specified dermatitis: Secondary | ICD-10-CM | POA: Diagnosis not present

## 2017-06-03 DIAGNOSIS — D485 Neoplasm of uncertain behavior of skin: Secondary | ICD-10-CM | POA: Diagnosis not present

## 2017-06-17 DIAGNOSIS — L0100 Impetigo, unspecified: Secondary | ICD-10-CM | POA: Diagnosis not present

## 2017-06-17 DIAGNOSIS — L280 Lichen simplex chronicus: Secondary | ICD-10-CM | POA: Diagnosis not present

## 2017-07-29 DIAGNOSIS — E782 Mixed hyperlipidemia: Secondary | ICD-10-CM | POA: Diagnosis not present

## 2017-07-29 DIAGNOSIS — I10 Essential (primary) hypertension: Secondary | ICD-10-CM | POA: Diagnosis not present

## 2017-07-29 DIAGNOSIS — R21 Rash and other nonspecific skin eruption: Secondary | ICD-10-CM | POA: Diagnosis not present

## 2017-07-29 DIAGNOSIS — Z1389 Encounter for screening for other disorder: Secondary | ICD-10-CM | POA: Diagnosis not present

## 2017-07-29 DIAGNOSIS — F325 Major depressive disorder, single episode, in full remission: Secondary | ICD-10-CM | POA: Diagnosis not present

## 2017-08-10 ENCOUNTER — Other Ambulatory Visit: Payer: Self-pay

## 2017-08-10 ENCOUNTER — Encounter (HOSPITAL_COMMUNITY): Payer: Self-pay | Admitting: *Deleted

## 2017-08-10 ENCOUNTER — Emergency Department (HOSPITAL_COMMUNITY)
Admission: EM | Admit: 2017-08-10 | Discharge: 2017-08-10 | Disposition: A | Discharge status: Home / Self Care | Payer: Medicare Other | Attending: Emergency Medicine | Admitting: Emergency Medicine

## 2017-08-10 DIAGNOSIS — Z79899 Other long term (current) drug therapy: Secondary | ICD-10-CM | POA: Insufficient documentation

## 2017-08-10 DIAGNOSIS — Z87891 Personal history of nicotine dependence: Secondary | ICD-10-CM | POA: Insufficient documentation

## 2017-08-10 DIAGNOSIS — I10 Essential (primary) hypertension: Secondary | ICD-10-CM | POA: Insufficient documentation

## 2017-08-10 DIAGNOSIS — L299 Pruritus, unspecified: Secondary | ICD-10-CM | POA: Diagnosis not present

## 2017-08-10 DIAGNOSIS — R21 Rash and other nonspecific skin eruption: Secondary | ICD-10-CM | POA: Diagnosis not present

## 2017-08-10 MED ORDER — CLOTRIMAZOLE 1 % EX CREA: TOPICAL_CREAM | CUTANEOUS | 1 refills | Status: DC

## 2017-08-10 MED ORDER — PREDNISONE 20 MG PO TABS: ORAL_TABLET | ORAL | 0 refills | Status: DC

## 2017-08-10 MED ORDER — PREDNISONE 50 MG PO TABS
60.0000 mg | ORAL_TABLET | Freq: Once | ORAL | Status: AC
  Administered 2017-08-10: 60 mg via ORAL
  Filled 2017-08-10: qty 1

## 2017-08-10 NOTE — ED Triage Notes (Signed)
Pt c/o itchy rash to chest, back and arms x 1 year, worsening over the weekend. Pt has seen dermatologist about 6 weeks ago and was told they didn't know what it was but gave pt prescription for Zyrtec with no relief.

## 2017-08-10 NOTE — ED Provider Notes (Signed)
[facility] EMERGENCY DEPARTMENT Provider Note   CSN: [id number] Arrival date & time: 08/10/17  4268     History   Chief Complaint Chief Complaint  Patient presents with  . Rash    HPI Tyrone [name] is a 72 y.o. male.   Rash   This is a chronic problem. The current episode started more than 1 week ago. The problem has been gradually worsening. The problem is associated with nothing. There has been no fever. The rash is present on the torso, left upper leg, left lower leg, right lower leg and right upper leg. The pain is moderate. Associated symptoms include itching. He has tried antihistamines, steriods and anti-itch cream for the symptoms. The treatment provided significant (improved with steroids in past but returned) relief.    Past Medical History:  Diagnosis Date  . Anemia   . Arthritis    hands  . Blood transfusion without reported diagnosis    for stomach ulcers  . Depression   . Hypertension   . Kidney stone   . Multiple duodenal ulcers     There are no active problems to display for this patient.   Past Surgical History:  Procedure Laterality Date  . ABDOMINAL SURGERY    . COLONOSCOPY    . LITHOTRIPSY          Home Medications    Prior to Admission medications   Medication Sig Start Date End Date Taking? Authorizing Provider  allopurinol (ZYLOPRIM) 300 MG tablet Take 1 tablet by mouth daily. 05/01/14   [provider]  clotrimazole (LOTRIMIN) 1 % cream Apply to affected area 2 times daily until 2 days after rash clears up 08/10/17   Couper Juncaj, Corene Cornea, MD  FLUoxetine (PROZAC) 20 MG capsule Take 1 capsule by mouth daily. 05/01/14   [provider]  predniSONE (DELTASONE) 20 MG tablet 3 tabs po daily x 3 days, then 2 tabs x 3 days, then 1.5 tabs x 3 days, then 1 tab x 3 days, then 0.5 tabs x 3 days 08/10/17   Phenix Vandermeulen, Corene Cornea, MD  ramipril (ALTACE) 10 MG capsule Take 1 capsule by mouth daily. Reported on 06/06/2015 05/01/14   [provider]    Family History Family History  Problem Relation Age of Onset  . Colon cancer Neg Hx     Social History Social History   Tobacco Use  . Smoking status: Former Smoker    Last attempt to quit: 05/30/1980    Years since quitting: 37.2  . Smokeless tobacco: Former Systems developer    Types: Chew    Quit date: 05/30/2007  Substance Use Topics  . Alcohol use: No    Alcohol/week: 0.0 oz  . Drug use: No     Allergies   Patient has no known allergies.   Review of Systems Review of Systems  Skin: Positive for itching and rash.  All other systems reviewed and are negative.    Physical Exam Updated Vital Signs BP (!) 188/81 (BP Location: Left Arm)   Pulse 86   Temp 98 F (36.7 C) (Oral)   Resp 16   Ht 5\' 9"  (1.753 m)   Wt 90.7 kg (200 lb)   SpO2 99%   BMI 29.53 kg/m   Physical Exam  Constitutional: He appears well-developed and well-nourished.  HENT:  Head: Normocephalic and atraumatic.  Eyes: Conjunctivae and EOM are normal.  Neck: Normal range of motion.  Cardiovascular: Normal rate.  Pulmonary/Chest: Effort normal. No respiratory distress.  Abdominal:  Soft. He exhibits no distension.  Musculoskeletal: Normal range of motion.  Neurological: He is alert.  Skin: Skin is warm and dry. Rash (patchy areas of scaly rash with associated excoriations throughout trso and extremities. no oral involvement. one on torso has a raised ring around it. ) noted.  Nursing note and vitals reviewed.    ED Treatments / Results  Labs (all labs ordered are listed, but only abnormal results are displayed) Labs Reviewed - No data to display  EKG None  Radiology No results found.  Procedures Procedures (including critical care time)  Medications Ordered in ED Medications  predniSONE (DELTASONE) tablet 60 mg (has no administration in time range)     Initial Impression / Assessment and Plan / ED Course  I have reviewed the triage vital signs and the nursing  notes.  Pertinent labs & imaging results that were available during my care of the patient were reviewed by me and considered in my medical decision making (see chart for details).      Here with rash. Seems most consistent with eczema however one spot on back does appear fungal.  Patient appears non toxic, well hydrated and in no distress.  Doubt meningococcemia, SJS, anaphylaxis, cellulitis, toxic shock, RMSF or necrotizing fascitis as cause for rash.  Will suggest steroid taper/clotrimazole and continue atarax and close pcp follow up otherwise return here for new/worsening symptoms.    Final Clinical Impressions(s) / ED Diagnoses   Final diagnoses:  Rash    ED Discharge Orders        Ordered    predniSONE (DELTASONE) 20 MG tablet     08/10/17 1053    clotrimazole (LOTRIMIN) 1 % cream     08/10/17 1053       Keili Hasten, Corene Cornea, MD 08/10/17 1059

## 2017-08-21 DIAGNOSIS — R238 Other skin changes: Secondary | ICD-10-CM | POA: Diagnosis not present

## 2017-08-21 DIAGNOSIS — R21 Rash and other nonspecific skin eruption: Secondary | ICD-10-CM | POA: Diagnosis not present

## 2017-08-21 DIAGNOSIS — I10 Essential (primary) hypertension: Secondary | ICD-10-CM | POA: Diagnosis not present

## 2017-10-11 ENCOUNTER — Emergency Department (HOSPITAL_COMMUNITY)
Admission: EM | Admit: 2017-10-11 | Discharge: 2017-10-11 | Disposition: A | Discharge status: Home / Self Care | Payer: Medicare Other | Attending: Emergency Medicine | Admitting: Emergency Medicine

## 2017-10-11 ENCOUNTER — Encounter (HOSPITAL_COMMUNITY): Payer: Self-pay | Admitting: Emergency Medicine

## 2017-10-11 ENCOUNTER — Emergency Department (HOSPITAL_COMMUNITY): Payer: Medicare Other

## 2017-10-11 DIAGNOSIS — R509 Fever, unspecified: Secondary | ICD-10-CM | POA: Insufficient documentation

## 2017-10-11 DIAGNOSIS — I10 Essential (primary) hypertension: Secondary | ICD-10-CM | POA: Diagnosis not present

## 2017-10-11 DIAGNOSIS — Z87891 Personal history of nicotine dependence: Secondary | ICD-10-CM | POA: Diagnosis not present

## 2017-10-11 DIAGNOSIS — R402411 Glasgow coma scale score 13-15, in the field [EMT or ambulance]: Secondary | ICD-10-CM | POA: Diagnosis not present

## 2017-10-11 DIAGNOSIS — R112 Nausea with vomiting, unspecified: Secondary | ICD-10-CM | POA: Diagnosis not present

## 2017-10-11 DIAGNOSIS — R197 Diarrhea, unspecified: Secondary | ICD-10-CM | POA: Diagnosis not present

## 2017-10-11 DIAGNOSIS — Z79899 Other long term (current) drug therapy: Secondary | ICD-10-CM | POA: Insufficient documentation

## 2017-10-11 DIAGNOSIS — R000 Tachycardia, unspecified: Secondary | ICD-10-CM | POA: Diagnosis not present

## 2017-10-11 DIAGNOSIS — R05 Cough: Secondary | ICD-10-CM | POA: Diagnosis not present

## 2017-10-11 LAB — URINALYSIS, ROUTINE W REFLEX MICROSCOPIC
BILIRUBIN URINE: NEGATIVE
Glucose, UA: NEGATIVE mg/dL
Ketones, ur: NEGATIVE mg/dL
Nitrite: POSITIVE — AB
Protein, ur: 100 mg/dL — AB
SPECIFIC GRAVITY, URINE: 1.023 (ref 1.005–1.030)
pH: 5 (ref 5.0–8.0)

## 2017-10-11 LAB — CBC WITH DIFFERENTIAL/PLATELET
BASOS ABS: 0 10*3/uL (ref 0.0–0.1)
Basophils Relative: 0 %
EOS PCT: 0 %
Eosinophils Absolute: 0 10*3/uL (ref 0.0–0.7)
HCT: 34.4 % — ABNORMAL LOW (ref 39.0–52.0)
HEMOGLOBIN: 11.3 g/dL — AB (ref 13.0–17.0)
LYMPHS PCT: 5 %
Lymphs Abs: 0.5 10*3/uL — ABNORMAL LOW (ref 0.7–4.0)
MCH: 32.9 pg (ref 26.0–34.0)
MCHC: 32.8 g/dL (ref 30.0–36.0)
MCV: 100.3 fL — AB (ref 78.0–100.0)
Monocytes Absolute: 0.6 10*3/uL (ref 0.1–1.0)
Monocytes Relative: 6 %
NEUTROS PCT: 89 %
Neutro Abs: 8.3 10*3/uL — ABNORMAL HIGH (ref 1.7–7.7)
PLATELETS: 148 10*3/uL — AB (ref 150–400)
RBC: 3.43 MIL/uL — AB (ref 4.22–5.81)
RDW: 12.3 % (ref 11.5–15.5)
WBC: 9.4 10*3/uL (ref 4.0–10.5)

## 2017-10-11 LAB — COMPREHENSIVE METABOLIC PANEL
ALBUMIN: 2.9 g/dL — AB (ref 3.5–5.0)
ALT: 14 U/L — ABNORMAL LOW (ref 17–63)
ANION GAP: 6 (ref 5–15)
AST: 17 U/L (ref 15–41)
Alkaline Phosphatase: 58 U/L (ref 38–126)
BUN: 17 mg/dL (ref 6–20)
CO2: 27 mmol/L (ref 22–32)
Calcium: 8.1 mg/dL — ABNORMAL LOW (ref 8.9–10.3)
Chloride: 103 mmol/L (ref 101–111)
Creatinine, Ser: 1.01 mg/dL (ref 0.61–1.24)
GFR calc Af Amer: >60 mL/min (ref >60)
GFR calc non Af Amer: >60 mL/min (ref >60)
GLUCOSE: 158 mg/dL — AB (ref 65–99)
POTASSIUM: 3.4 mmol/L — AB (ref 3.5–5.1)
SODIUM: 136 mmol/L (ref 135–145)
Total Bilirubin: 1 mg/dL (ref 0.3–1.2)
Total Protein: 5.9 g/dL — ABNORMAL LOW (ref 6.5–8.1)

## 2017-10-11 LAB — LACTIC ACID, PLASMA
LACTIC ACID, VENOUS: 1.1 mmol/L (ref 0.5–1.9)
Lactic Acid, Venous: 0.7 mmol/L (ref 0.5–1.9)

## 2017-10-11 IMAGING — DX DG CHEST 2V
2 series · 2 of 2 positions shown · non-contrast
Comparison: None.

CLINICAL DATA: Cough and fever

EXAM:
CHEST - 2 VIEW

[chest lat]
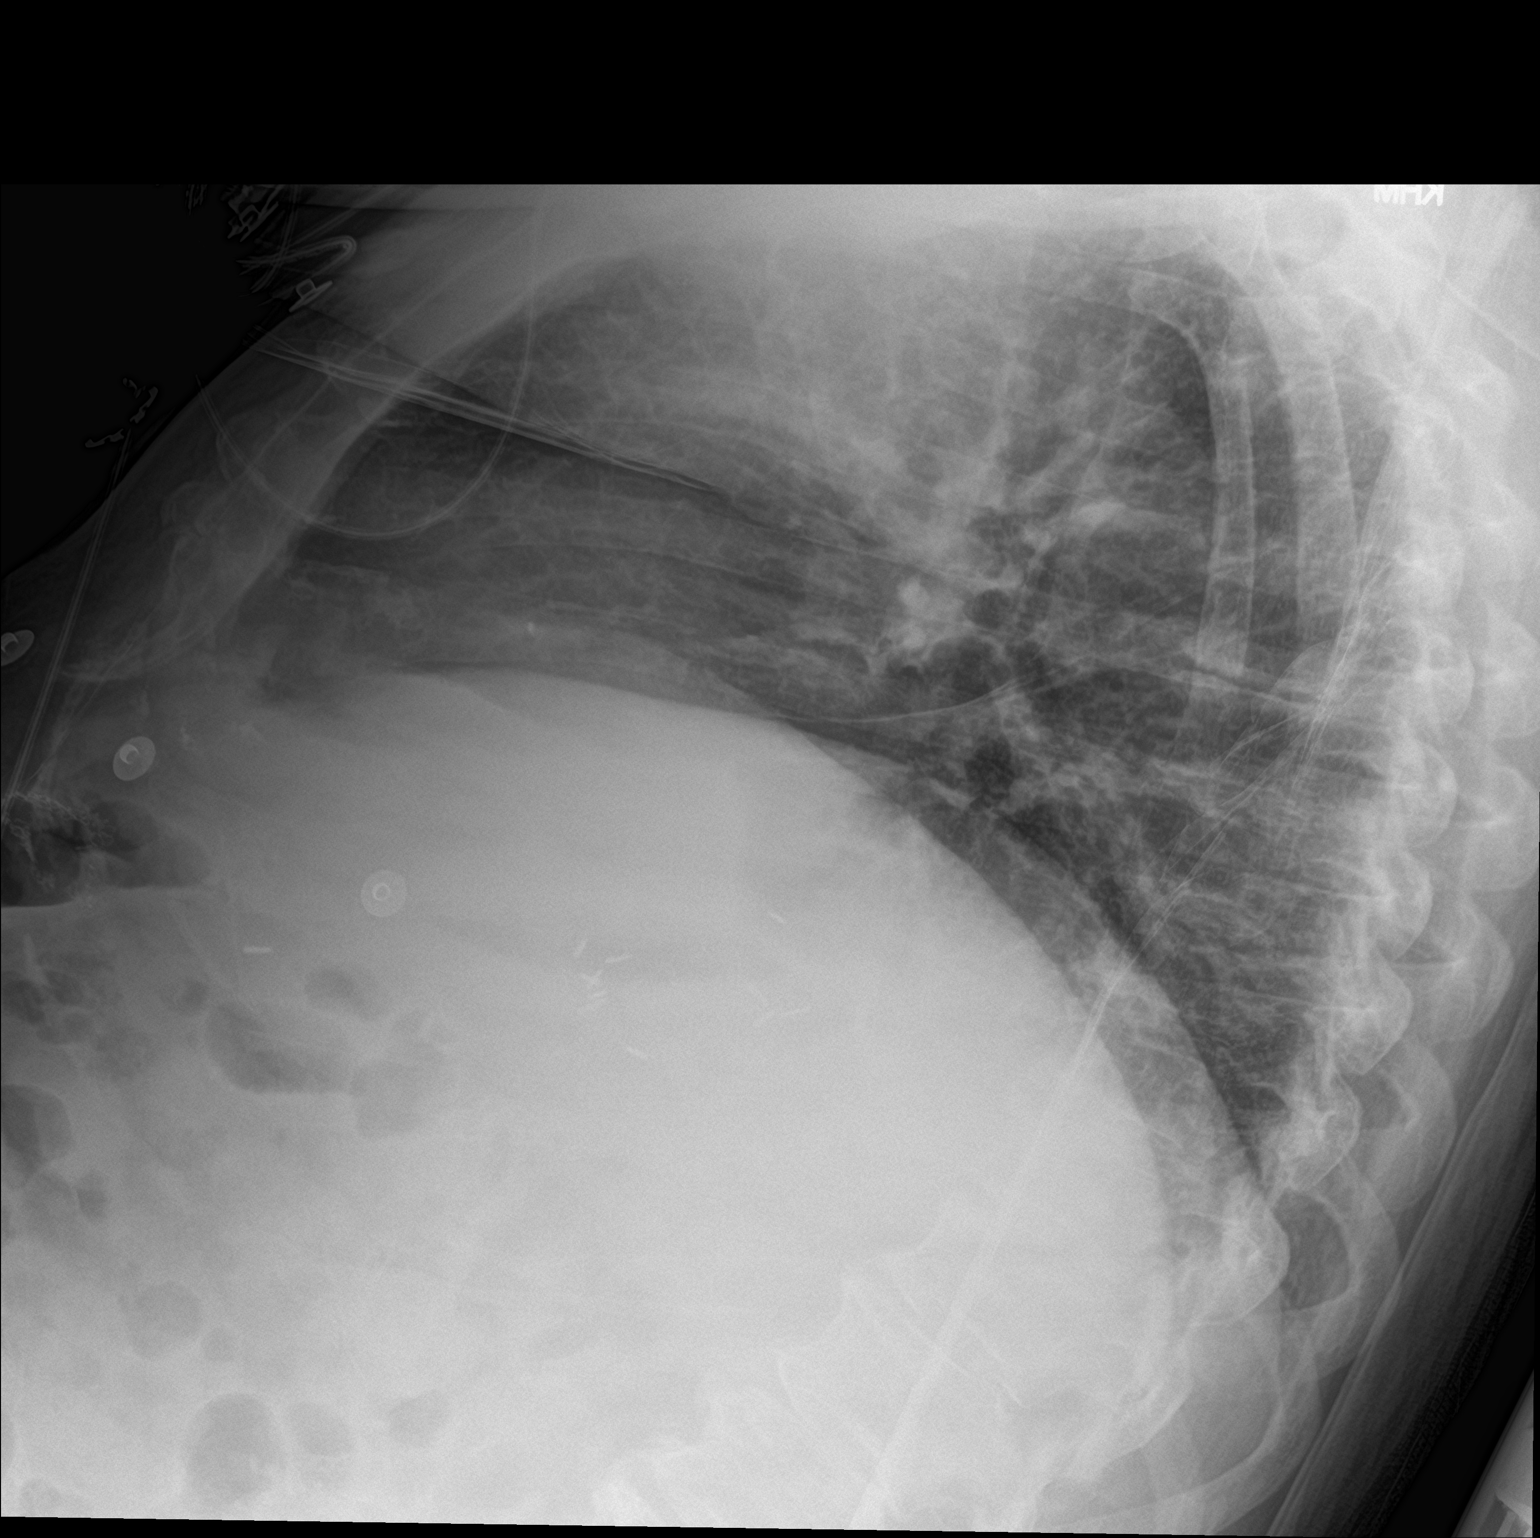

[chest ap]
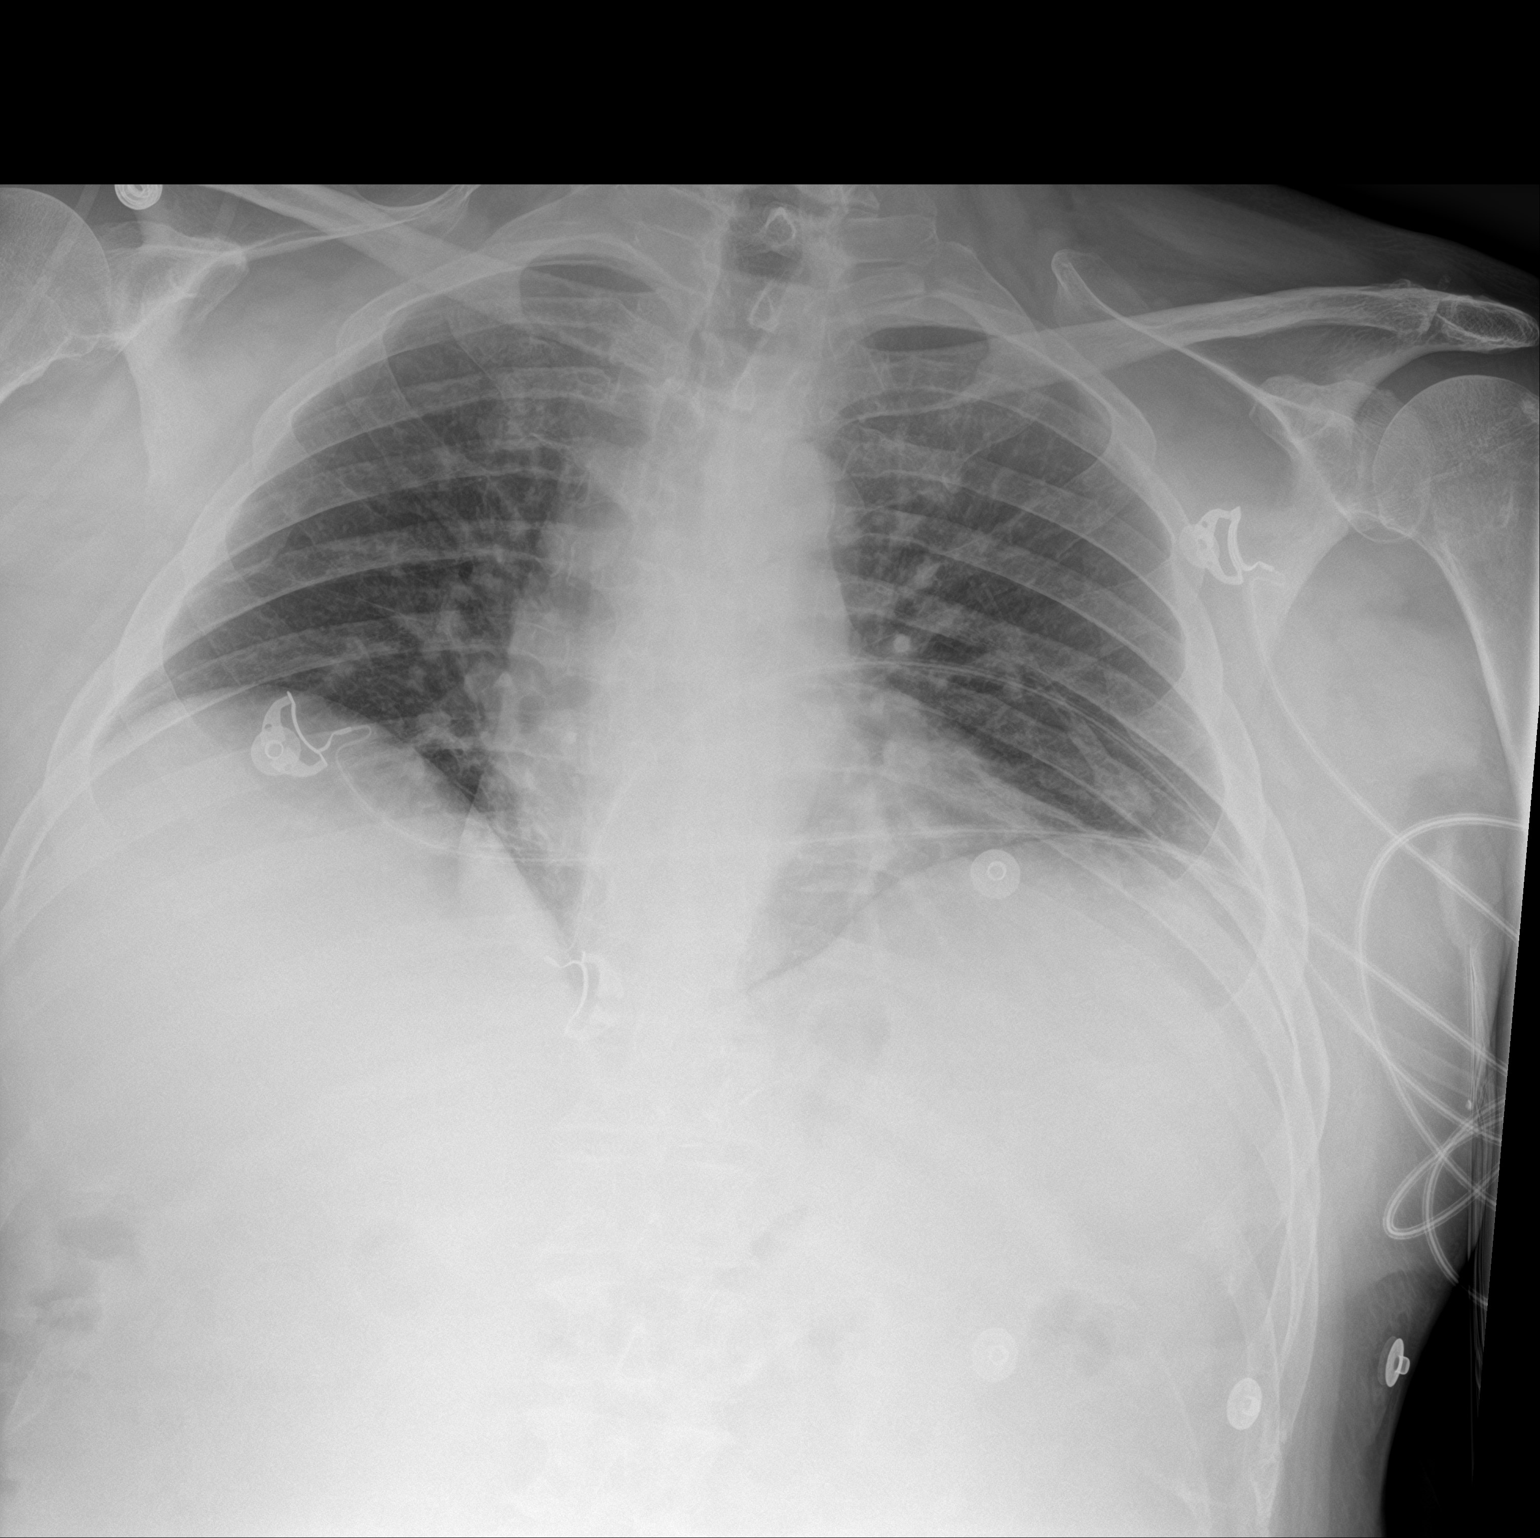

[2 of 2 positions shown; findings below may reference images not displayed]

FINDINGS: Lungs are clear. Heart size and pulmonary vascularity are normal. No
adenopathy. No bone lesions. There are surgical clips in the upper
abdomen.
IMPRESSION: No edema or consolidation.

## 2017-10-11 MED ORDER — ONDANSETRON HCL 4 MG/2ML IJ SOLN
4.0000 mg | Freq: Once | INTRAMUSCULAR | Status: AC
  Administered 2017-10-11: 4 mg via INTRAVENOUS
  Filled 2017-10-11: qty 2

## 2017-10-11 MED ORDER — ONDANSETRON 4 MG PO TBDP: 4.0000 mg | ORAL_TABLET | Freq: Three times a day (TID) | ORAL | 0 refills | Status: DC | PRN

## 2017-10-11 MED ORDER — SODIUM CHLORIDE 0.9 % IV BOLUS
1000.0000 mL | Freq: Once | INTRAVENOUS | Status: AC
  Administered 2017-10-11: 1000 mL via INTRAVENOUS

## 2017-10-11 NOTE — ED Notes (Signed)
Pt was informed that we need a urine sample. Pt states that he can not urinate at this time. 

## 2017-10-11 NOTE — ED Provider Notes (Signed)
[facility] EMERGENCY DEPARTMENT Provider Note   CSN: [id number] Arrival date & time: 10/11/17  1601     History   Chief Complaint Chief Complaint  Patient presents with  . Fever    HPI Tyrone [name] is a 72 y.o. male.  HPI Patient presents with fever cough nausea vomiting diarrhea.  States he feels bad.  Presents with a fever and tachycardia.  States he aches all over.  Initially somewhat diaphoretic.  No sick contacts.  Occasional cough. Past Medical History:  Diagnosis Date  . Anemia   . Arthritis    hands  . Blood transfusion without reported diagnosis    for stomach ulcers  . Depression   . Hypertension   . Kidney stone   . Multiple duodenal ulcers     There are no active problems to display for this patient.   Past Surgical History:  Procedure Laterality Date  . ABDOMINAL SURGERY    . COLONOSCOPY    . LITHOTRIPSY          Home Medications    Prior to Admission medications   Medication Sig Start Date End Date Taking? Authorizing Provider  allopurinol (ZYLOPRIM) 300 MG tablet Take 1 tablet by mouth daily. 05/01/14  Yes [provider]  amLODipine (NORVASC) 10 MG tablet Take 10 mg by mouth daily.   Yes [provider]  FLUoxetine (PROZAC) 20 MG capsule Take 1 capsule by mouth daily. 05/01/14  Yes [provider]  hydrOXYzine (ATARAX/VISTARIL) 25 MG tablet Take 25 mg by mouth at bedtime.   Yes [provider]  ramipril (ALTACE) 10 MG capsule Take 1 capsule by mouth 2 (two) times daily. Reported on 06/06/2015 05/01/14  Yes [provider]  ondansetron (ZOFRAN-ODT) 4 MG disintegrating tablet Take 1 tablet (4 mg total) by mouth every 8 (eight) hours as needed for nausea or vomiting. 10/11/17   Davonna Belling, MD    Family History Family History  Problem Relation Age of Onset  . Colon cancer Neg Hx     Social History Social History   Tobacco Use  . Smoking status: Former Smoker    Last attempt to  quit: 05/30/1980    Years since quitting: 37.3  . Smokeless tobacco: Former Systems developer    Types: Chew    Quit date: 05/30/2007  Substance Use Topics  . Alcohol use: Yes    Alcohol/week: 0.0 oz    Comment: rarely per pt/heavy per wife  . Drug use: No     Allergies   Patient has no known allergies.   Review of Systems Review of Systems  Constitutional: Positive for appetite change and fever.  HENT: Negative for congestion.   Respiratory: Positive for cough.   Cardiovascular: Negative for chest pain.  Gastrointestinal: Positive for diarrhea, nausea and vomiting.  Genitourinary: Negative for frequency.  Musculoskeletal: Positive for myalgias. Negative for back pain.  Neurological: Negative for weakness.  Psychiatric/Behavioral: Negative for hallucinations.     Physical Exam Updated Vital Signs BP 130/73   Pulse 92   Temp 98.2 F (36.8 C) (Oral)   Resp (!) 23   Ht 5\' 10"  (1.778 m)   Wt 89.8 kg (198 lb)   SpO2 98%   BMI 28.41 kg/m   Physical Exam  Constitutional: He appears well-developed.  HENT:  Head: Normocephalic.  Eyes: Pupils are equal, round, and reactive to light.  Neck: Neck supple.  Cardiovascular:  Tachycardia  Pulmonary/Chest: Effort normal. He has no wheezes. He has no rales.  Abdominal: There is no tenderness.  Musculoskeletal: He exhibits no edema.  Neurological: He is alert.  Skin: Skin is warm. Capillary refill takes less than 2 seconds.     ED Treatments / Results  Labs (all labs ordered are listed, but only abnormal results are displayed) Labs Reviewed  COMPREHENSIVE METABOLIC PANEL - Abnormal; Notable for the following components:      Result Value   Potassium 3.4 (*)    Glucose, Bld 158 (*)    Calcium 8.1 (*)    Total Protein 5.9 (*)    Albumin 2.9 (*)    ALT 14 (*)    All other components within normal limits  CBC WITH DIFFERENTIAL/PLATELET - Abnormal; Notable for the following components:   RBC 3.43 (*)    Hemoglobin 11.3 (*)    HCT  34.4 (*)    MCV 100.3 (*)    Platelets 148 (*)    Neutro Abs 8.3 (*)    Lymphs Abs 0.5 (*)    All other components within normal limits  URINALYSIS, ROUTINE W REFLEX MICROSCOPIC - Abnormal; Notable for the following components:   APPearance CLOUDY (*)    Hgb urine dipstick MODERATE (*)    Protein, ur 100 (*)    Nitrite POSITIVE (*)    Leukocytes, UA MODERATE (*)    WBC, UA >50 (*)    Bacteria, UA FEW (*)    All other components within normal limits  URINE CULTURE  LACTIC ACID, PLASMA  LACTIC ACID, PLASMA    EKG None  Radiology Dg Chest 2 View  Result Date: 10/11/2017 CLINICAL DATA:  Cough and fever EXAM: CHEST - 2 VIEW COMPARISON:  None. FINDINGS: Lungs are clear. Heart size and pulmonary vascularity are normal. No adenopathy. No bone lesions. There are surgical clips in the upper abdomen. IMPRESSION: No edema or consolidation. Electronically Signed   By: Lowella Grip III M.D.   On: 10/11/2017 16:26    Procedures Procedures (including critical care time)  Medications Ordered in ED Medications  sodium chloride 0.9 % bolus 1,000 mL (0 mLs Intravenous Stopped 10/11/17 1843)  ondansetron (ZOFRAN) injection 4 mg (4 mg Intravenous Given 10/11/17 1705)     Initial Impression / Assessment and Plan / ED Course  I have reviewed the triage vital signs and the nursing notes.  Pertinent labs & imaging results that were available during my care of the patient were reviewed by me and considered in my medical decision making (see chart for details).     Patient with fever.  Has had some nausea vomiting.  Also cough.  X-ray reassuring.  Urine shows white cells without bacteria.  Patient feels better and has defervesced.  With nausea vomiting some diarrhea will treat symptomatically.  Could be gastroenteritis.  Urine culture sent.  Discharge home.  Final Clinical Impressions(s) / ED Diagnoses   Final diagnoses:  Nausea vomiting and diarrhea    ED Discharge Orders         Ordered    ondansetron (ZOFRAN-ODT) 4 MG disintegrating tablet  Every 8 hours PRN     10/11/17 2036       Davonna Belling, MD 10/11/17 2207

## 2017-10-11 NOTE — ED Notes (Signed)
Patient given crackers peanut butter and ginger ale.

## 2017-10-11 NOTE — ED Triage Notes (Signed)
Pt reports fever and cough x 2 days with chills.  States he went outside in the sun and it got worse.  Also wife reports he usually drinks daily and has not been drinking.  Vomiting as well.

## 2017-10-11 NOTE — ED Notes (Signed)
Patient able to keep food and fluids down.

## 2017-10-14 LAB — URINE CULTURE

## 2017-10-15 ENCOUNTER — Telehealth: Payer: Self-pay | Admitting: *Deleted

## 2017-10-15 NOTE — Telephone Encounter (Signed)
Post ED Visit - Positive Culture Follow-up  Culture report reviewed by antimicrobial stewardship pharmacist:  []  Elenor Quinones, Pharm.D. []  Heide Guile, Pharm.D., BCPS AQ-ID []  Parks Neptune, Pharm.D., BCPS []  Alycia Rossetti, Pharm.D., BCPS []  [place], Pharm.D., BCPS, AAHIVP []  Legrand Como, Pharm.D., BCPS, AAHIVP []  Salome Arnt, PharmD, BCPS []  Wynell Balloon, PharmD []  Vincenza Hews, PharmD, BCPS  Positive urine culture  Spoke with son of patient who states patient is feeling much better and no further patient follow-up is required at this time.  No further treatment given, left number for call back if patient would like to speak with RN.  H[facility]on [facility] 10/15/2017, 10:29 AM

## 2017-10-30 DIAGNOSIS — I10 Essential (primary) hypertension: Secondary | ICD-10-CM | POA: Diagnosis not present

## 2017-10-30 DIAGNOSIS — F325 Major depressive disorder, single episode, in full remission: Secondary | ICD-10-CM | POA: Diagnosis not present

## 2017-10-30 DIAGNOSIS — R21 Rash and other nonspecific skin eruption: Secondary | ICD-10-CM | POA: Diagnosis not present

## 2017-10-30 DIAGNOSIS — M109 Gout, unspecified: Secondary | ICD-10-CM | POA: Diagnosis not present

## 2017-11-09 DIAGNOSIS — L814 Other melanin hyperpigmentation: Secondary | ICD-10-CM | POA: Diagnosis not present

## 2017-11-09 DIAGNOSIS — L0101 Non-bullous impetigo: Secondary | ICD-10-CM | POA: Diagnosis not present

## 2017-11-09 DIAGNOSIS — D485 Neoplasm of uncertain behavior of skin: Secondary | ICD-10-CM | POA: Diagnosis not present

## 2017-11-09 DIAGNOSIS — L309 Dermatitis, unspecified: Secondary | ICD-10-CM | POA: Diagnosis not present

## 2017-11-09 DIAGNOSIS — L0109 Other impetigo: Secondary | ICD-10-CM | POA: Diagnosis not present

## 2017-11-09 DIAGNOSIS — L308 Other specified dermatitis: Secondary | ICD-10-CM | POA: Diagnosis not present

## 2017-12-08 DIAGNOSIS — L2089 Other atopic dermatitis: Secondary | ICD-10-CM | POA: Diagnosis not present

## 2017-12-08 DIAGNOSIS — L011 Impetiginization of other dermatoses: Secondary | ICD-10-CM | POA: Diagnosis not present

## 2018-02-08 DIAGNOSIS — L2089 Other atopic dermatitis: Secondary | ICD-10-CM | POA: Diagnosis not present

## 2018-02-08 DIAGNOSIS — L011 Impetiginization of other dermatoses: Secondary | ICD-10-CM | POA: Diagnosis not present

## 2018-02-08 DIAGNOSIS — L0101 Non-bullous impetigo: Secondary | ICD-10-CM | POA: Diagnosis not present

## 2018-04-12 DIAGNOSIS — L0101 Non-bullous impetigo: Secondary | ICD-10-CM | POA: Diagnosis not present

## 2018-04-12 DIAGNOSIS — L011 Impetiginization of other dermatoses: Secondary | ICD-10-CM | POA: Diagnosis not present

## 2018-04-12 DIAGNOSIS — L308 Other specified dermatitis: Secondary | ICD-10-CM | POA: Diagnosis not present

## 2018-04-21 DIAGNOSIS — I10 Essential (primary) hypertension: Secondary | ICD-10-CM | POA: Diagnosis not present

## 2018-04-21 DIAGNOSIS — R000 Tachycardia, unspecified: Secondary | ICD-10-CM | POA: Diagnosis not present

## 2018-04-21 DIAGNOSIS — I959 Hypotension, unspecified: Secondary | ICD-10-CM | POA: Diagnosis not present

## 2018-04-21 DIAGNOSIS — Z7982 Long term (current) use of aspirin: Secondary | ICD-10-CM | POA: Diagnosis not present

## 2018-04-21 DIAGNOSIS — S80811A Abrasion, right lower leg, initial encounter: Secondary | ICD-10-CM | POA: Diagnosis not present

## 2018-04-21 DIAGNOSIS — S81811A Laceration without foreign body, right lower leg, initial encounter: Secondary | ICD-10-CM | POA: Diagnosis not present

## 2018-04-21 DIAGNOSIS — R42 Dizziness and giddiness: Secondary | ICD-10-CM | POA: Diagnosis not present

## 2018-04-21 DIAGNOSIS — W271XXA Contact with garden tool, initial encounter: Secondary | ICD-10-CM | POA: Diagnosis not present

## 2018-04-21 DIAGNOSIS — Z79899 Other long term (current) drug therapy: Secondary | ICD-10-CM | POA: Diagnosis not present

## 2018-04-21 DIAGNOSIS — R58 Hemorrhage, not elsewhere classified: Secondary | ICD-10-CM | POA: Diagnosis not present

## 2018-04-30 DIAGNOSIS — S81811D Laceration without foreign body, right lower leg, subsequent encounter: Secondary | ICD-10-CM | POA: Diagnosis not present

## 2018-05-10 DIAGNOSIS — R202 Paresthesia of skin: Secondary | ICD-10-CM | POA: Diagnosis not present

## 2018-05-10 DIAGNOSIS — R200 Anesthesia of skin: Secondary | ICD-10-CM | POA: Diagnosis not present

## 2018-05-10 DIAGNOSIS — J209 Acute bronchitis, unspecified: Secondary | ICD-10-CM | POA: Diagnosis not present

## 2018-05-13 ENCOUNTER — Encounter: Payer: Self-pay | Admitting: Family Medicine

## 2018-05-13 ENCOUNTER — Ambulatory Visit (INDEPENDENT_AMBULATORY_CARE_PROVIDER_SITE_OTHER): Payer: Medicare Other | Admitting: Family Medicine

## 2018-05-13 VITALS — BP 124/70 | HR 93 | Temp 97.7°F | Ht 70.0 in | Wt 178.2 lb

## 2018-05-13 DIAGNOSIS — Z23 Encounter for immunization: Secondary | ICD-10-CM

## 2018-05-13 DIAGNOSIS — F1010 Alcohol abuse, uncomplicated: Secondary | ICD-10-CM | POA: Diagnosis not present

## 2018-05-13 DIAGNOSIS — R29818 Other symptoms and signs involving the nervous system: Secondary | ICD-10-CM | POA: Diagnosis not present

## 2018-05-13 DIAGNOSIS — M21372 Foot drop, left foot: Secondary | ICD-10-CM

## 2018-05-13 DIAGNOSIS — F419 Anxiety disorder, unspecified: Secondary | ICD-10-CM | POA: Insufficient documentation

## 2018-05-13 DIAGNOSIS — R202 Paresthesia of skin: Secondary | ICD-10-CM | POA: Diagnosis not present

## 2018-05-13 DIAGNOSIS — R600 Localized edema: Secondary | ICD-10-CM | POA: Diagnosis not present

## 2018-05-13 DIAGNOSIS — S81812A Laceration without foreign body, left lower leg, initial encounter: Secondary | ICD-10-CM

## 2018-05-13 DIAGNOSIS — F321 Major depressive disorder, single episode, moderate: Secondary | ICD-10-CM

## 2018-05-13 LAB — COMPREHENSIVE METABOLIC PANEL
ALBUMIN: 4 g/dL (ref 3.5–5.2)
ALK PHOS: 115 U/L (ref 39–117)
ALT: 66 U/L — ABNORMAL HIGH (ref 0–53)
AST: 160 U/L — ABNORMAL HIGH (ref 0–37)
BUN: 15 mg/dL (ref 6–23)
CO2: 29 mEq/L (ref 19–32)
Calcium: 8.9 mg/dL (ref 8.4–10.5)
Chloride: 101 mEq/L (ref 96–112)
Creatinine, Ser: 1.16 mg/dL (ref 0.40–1.50)
GFR: 65.65 mL/min (ref >60.00)
GLUCOSE: 114 mg/dL — AB (ref 70–99)
POTASSIUM: 4.1 meq/L (ref 3.5–5.1)
Sodium: 142 mEq/L (ref 135–145)
TOTAL PROTEIN: 6.5 g/dL (ref 6.0–8.3)
Total Bilirubin: 0.5 mg/dL (ref 0.2–1.2)

## 2018-05-13 LAB — CBC
HEMATOCRIT: 36.2 % — AB (ref 39.0–52.0)
HEMOGLOBIN: 12.2 g/dL — AB (ref 13.0–17.0)
MCHC: 33.7 g/dL (ref 30.0–36.0)
MCV: 105 fl — AB (ref 78.0–100.0)
Platelets: 233 10*3/uL (ref 150.0–400.0)
RBC: 3.44 Mil/uL — ABNORMAL LOW (ref 4.22–5.81)
RDW: 13.7 % (ref 11.5–15.5)
WBC: 5.9 10*3/uL (ref 4.0–10.5)

## 2018-05-13 LAB — VITAMIN B12: Vitamin B-12: 294 pg/mL (ref 211–911)

## 2018-05-13 LAB — TSH: TSH: 0.08 u[IU]/mL — AB (ref 0.35–4.50)

## 2018-05-13 MED ORDER — CITALOPRAM HYDROBROMIDE 40 MG PO TABS: 40.0000 mg | ORAL_TABLET | Freq: Every day | ORAL | 3 refills | Status: DC

## 2018-05-13 NOTE — Assessment & Plan Note (Signed)
See depression A/P.  Switch from Prozac to Celexa.  Follow-up with behavioral specialist.  Follow-up with me in 4 to 6 weeks.

## 2018-05-13 NOTE — Assessment & Plan Note (Signed)
As noted above, concern for stroke versus peripheral neuropathy.  Given that symptoms have been present for several weeks, we do not need to proceed with emergent work-up at this point.  Check CBC, CMET, TSH, and B12.  Check brain MRI as noted above.  DME order for foot drop brace was placed today.  Depending on results above may need referral to PT, sports medicine, and/or neurology.

## 2018-05-13 NOTE — Assessment & Plan Note (Signed)
Lengthy discussion with patient and his daughter in law regarding his mental health.  Symptoms seem to have significantly worsened over the last several years he has gotten to the point where he is now self-medicating with alcohol-see below problems.  We will check blood work as noted above to rule out for any organic or metabolic causes for his depressed mood and insomnia.  We will switch from Prozac to Celexa as he does not think the Prozac is helping with him much anymore.  He will follow-up with me in 4 to 6 weeks.  He will also follow-up with our behavioral specialist.  Discussed reasons to return to care and seek emergent care.

## 2018-05-13 NOTE — Progress Notes (Addendum)
Subjective:  Tyrone [name] is a 72 y.o. male who presents today with a chief complaint of foot numbness and to establish care.   HPI:  Foot Numbness, new problem Started 2 months ago. Located in left foot and radiates into his left knee.  No obvious precipitating events.  Symptoms seem to have worsened over the last several weeks.  He has had some weakness to his left lower extremity as well.  No back pain.  No fevers or chills.  He has had some cold intolerance.  No specific treatments tried.  Has never seen a physician for this.  He has fallen several times over the last several weeks due to lack of sensation in his foot.  Occasional pins-and-needles.  No arm weakness or numbness.  No slurred speech.  No vision changes.  Skin laceration He fell couple weeks ago and lacerated skin over his right leg.  Did not get tetanus shot with this.  Laceration seems to be healing.  No erythema.  No drainage.  Does not remember last tetanus vaccine.  Leg edema, new problem Also started a few months ago.  Located in bilateral lower extremities.  No reported chest pain or shortness of breath.  No obvious precipitating factors.  No other obvious alleviating factors.  Alcohol abuse, new problem Several year history.  He is drinking 4-5 drinks per his report.  Never had seizure or withdrawal symptoms.  He is interested in possibly getting inpatient treatment for his alcohol addiction.  He has been drinking for most of his life however had a period of about 50 years when he was sober.  Additional history obtained via his daughter-in-law as outlined below: Daughter-in-law reports that he is actually drinking much more than 4-5 drinks per day.  States that he will frequently wake up 1-2 times at night to drink alcohol.  Stated that he also drank before today's office visit this morning.  He will occasionally drive while intoxicated as well.  Family has tried to get him to seek assistance for this however is  been reluctant.  Diffuse rash Started several months ago.  Has seen 2 different dermatologist.  Started on a topical and oral antibiotic which seems to be helping with the symptoms.   Depression/Anxiety, new problems provider Patient also reports several year history of both depression and anxiety.  He is currently on Prozac 20 mg daily which he has been on for several years.  He was doing well for a while however over the last 1 to 2 years has had recurrence of his symptoms.  He thinks that some of his alcohol abuse is due to self-medicating both depression and anxiety.  He has never been on any other medications.  He has some associated insomnia for which he takes hydroxyzine 25 mg daily.  Depression screen PHQ 2/9 05/13/2018  Decreased Interest 3  Down, Depressed, Hopeless 2  PHQ - 2 Score 5  Altered sleeping 3  Tired, decreased energy 3  Change in appetite 3  Feeling bad or failure about yourself  0  Trouble concentrating 0  Moving slowly or fidgety/restless 2  Suicidal thoughts 0  PHQ-9 Score 16   Gout, chronic problem, stable Several year history.  On allopurinol 300 mg daily.  Tolerating well.  ROS: Per HPI, otherwise a complete review of systems was negative.   PMH:  The following were reviewed and entered/updated in epic: Past Medical History:  Diagnosis Date  . Anemia   . Arthritis  hands  . Blood transfusion without reported diagnosis    for stomach ulcers  . Depression   . Hypertension   . Kidney stone   . Multiple duodenal ulcers    Patient Active Problem List   Diagnosis Date Noted  . Left foot drop 05/13/2018  . Leg edema 05/13/2018  . Depression, major, single episode, moderate ([place]) 05/13/2018  . Anxiety 05/13/2018  . Paresthesia 05/13/2018  . Alcohol abuse 05/13/2018   Past Surgical History:  Procedure Laterality Date  . ABDOMINAL SURGERY     ulcers  . COLONOSCOPY    . LITHOTRIPSY      Family History  Problem Relation Age of Onset  .  Colon cancer Neg Hx     Medications- reviewed and updated Current Outpatient Medications  Medication Sig Dispense Refill  . allopurinol (ZYLOPRIM) 300 MG tablet Take 1 tablet by mouth daily.  1  . hydrOXYzine (ATARAX/VISTARIL) 25 MG tablet Take 25 mg by mouth at bedtime.    . citalopram (CELEXA) 40 MG tablet Take 1 tablet (40 mg total) by mouth daily. 30 tablet 3   No current facility-administered medications for this visit.     Allergies-reviewed and updated No Known Allergies  Social History   Socioeconomic History  . Marital status: Married    Spouse name: Not on file  . Number of children: Not on file  . Years of education: Not on file  . Highest education level: Not on file  Occupational History  . Not on file  Social Needs  . Financial resource strain: Not on file  . Food insecurity:    Worry: Not on file    Inability: Not on file  . Transportation needs:    Medical: Not on file    Non-medical: Not on file  Tobacco Use  . Smoking status: Former Smoker    Last attempt to quit: 05/30/1980    Years since quitting: 37.9  . Smokeless tobacco: Former Systems developer    Types: [place] date: 05/30/2007  Substance and Sexual Activity  . Alcohol use: Yes    Alcohol/week: 0.0 standard drinks    Comment: rarely per pt/heavy per wife  . Drug use: No  . Sexual activity: Not on file  Lifestyle  . Physical activity:    Days per week: Not on file    Minutes per session: Not on file  . Stress: Not on file  Relationships  . Social connections:    Talks on phone: Not on file    Gets together: Not on file    Attends religious service: Not on file    Active member of club or organization: Not on file    Attends meetings of clubs or organizations: Not on file    Relationship status: Not on file  Other Topics Concern  . Not on file  Social History Narrative  . Not on file     Objective:  Physical Exam: BP 124/70 (BP Location: Left Arm, Patient Position: Sitting, Cuff Size:  Normal)   Pulse 93   Temp 97.7 F (36.5 C) (Oral)   Ht 5\' 10"  (1.778 m)   Wt 178 lb 4 oz (80.9 kg)   SpO2 97%   BMI 25.58 kg/m   Gen: NAD, resting comfortably CV: RRR with no murmurs appreciated Pulm: NWOB, CTAB with no crackles, wheezes, or rhonchi GI: Normal bowel sounds present. Soft, Nontender, Nondistended. MSK:  -Neck: No deformities. -Upper extremities: No deformities.  Strength 5 out of 5  throughout. -Lower extremities: Right anterior shin with well-healed 10 cm laceration.  No surrounding erythema.  Nontender to palpation.  Bilateral lower extremities with 2+ pitting edema to midshin. -Left foot: Diminished sensation along dorsal aspect of left foot and lateral aspect of left shin.  5 out of 5 plantar flexion.  Unable to dorsiflex left foot. Skin: Warm, dry Neuro: Cranial nerves II through XII intact.  Slight ataxia with finger-nose-finger.  He has some sensory and motor deficits as noted above.  Negative pronator drift test. Psych: Normal affect and thought content  Assessment/Plan:  Paresthesia Differential at this point includes stroke versus peripheral neuropathy.  Check CBC, CMP, TSH, B12.  Check brain MRI to rule out stroke.  Given that symptoms have been persistent for several weeks we do not need to obtain urgent imaging at this time.  Leg edema Possibly related to venous insufficiency.  Normal cardiopulmonary exam.  Check CBC, CMP, TSH.  Continue leg elevation.  Avoid salt.  Left foot drop As noted above, concern for stroke versus peripheral neuropathy.  Given that symptoms have been present for several weeks, we do not need to proceed with emergent work-up at this point.  Check CBC, CMET, TSH, and B12.  Check brain MRI as noted above.  DME order for foot drop brace was placed today.  Depending on results above may need referral to PT, sports medicine, and/or neurology.  Depression, major, single episode, moderate ([place]) Lengthy discussion with patient and his  daughter in law regarding his mental health.  Symptoms seem to have significantly worsened over the last several years he has gotten to the point where he is now self-medicating with alcohol-see below problems.  We will check blood work as noted above to rule out for any organic or metabolic causes for his depressed mood and insomnia.  We will switch from Prozac to Celexa as he does not think the Prozac is helping with him much anymore.  He will follow-up with me in 4 to 6 weeks.  He will also follow-up with our behavioral specialist.  Discussed reasons to return to care and seek emergent care.  Anxiety See depression A/P.  Switch from Prozac to Celexa.  Follow-up with behavioral specialist.  Follow-up with me in 4 to 6 weeks.  Alcohol abuse Patient was asked about his alcohol use today.  See subjective section.  CAGE score 3/4. He was strongly advised to cut back and quit.  It is difficult to determine how much he is actually drinking however seems to be a significant amount given his daughter-in-law reported that he is waking up in the middle of night to drink.  He is receptive to treatment at this point.  Discussed different treatment options with patient including AA versus inpatient rehab.  He does not wish to pursue either of these options at this point.  He will also follow-up with our behavioral specialist as noted above.  We will switch from Prozac to Celexa which will hopefully help some with his depression and anxiety symptoms and decrease his need to self medicate.  As noted above, we will also check for other metabolic causes for his mental health symptoms.  He will follow-up with me in 4 to 6 weeks.  I spent approximately 15 minutes counseling patient on his alcohol abuse.  This time was independent of the time spent on counseling and coordinating care for his other medical issues that were discussed during his office visit today.  Laceration Appears to be healing.  Given  that injury  occurred several days ago, does not need sutures today.  Will update tetanus today.  Preventative Healthcare Flu shot given today. Health Maintenance Due  Topic Date Due  . Hepatitis C Screening  [birth year]  . PNA vac Low Risk Adult (1 of 2 - PCV13) 08/31/2010   Time Spent: I spent >60 minutes face-to-face with the patient, with more than half spent on coordinating care and counseling for treatment plan for his left foot drop, paresthesias, leg edema, depression/anxiety, and gout.  Total time spent with patient >80 minutes.   [name]. [name], MD 05/18/2018 7:47 AM

## 2018-05-13 NOTE — Assessment & Plan Note (Signed)
Possibly related to venous insufficiency.  Normal cardiopulmonary exam.  Check CBC, CMP, TSH.  Continue leg elevation.  Avoid salt.

## 2018-05-13 NOTE — Progress Notes (Signed)
Please inform patient of the following:  His liver numbers are elevated - this is most likely from alcohol use. Would like for him to come back in a week or so to recheck. Please place future CMET order for elevated LFTs.  His blood counts are stable, but still low.  His thyroid level was off as well. This could potentially explain some of his symptoms. Please have him come back in a week or so to recheck. Please place future order for TSH, Free T3, and Free T4 for abnormal TSH.  Please also place a future order for a folate and methylmalonic acid for macrocytic anemia.  [name]. [name], MD 05/13/2018 5:09 PM

## 2018-05-13 NOTE — Patient Instructions (Signed)
It was very nice to see you today!  We will check blood work today. We also need to get an MRI to make sure you didn't have a stroke.  Please start with Celexa.  Stop the Prozac.  Please come back to see Lattie Haw soon to talk about your mood and alcohol use.  Take care, Dr [name]

## 2018-05-13 NOTE — Assessment & Plan Note (Addendum)
Patient was asked about his alcohol use today.  See subjective section.  CAGE score 3/4. He was strongly advised to cut back and quit.  It is difficult to determine how much he is actually drinking however seems to be a significant amount given his daughter-in-law reported that he is waking up in the middle of night to drink.  He is receptive to treatment at this point.  Discussed different treatment options with patient including AA versus inpatient rehab.  He does not wish to pursue either of these options at this point.  He will also follow-up with our behavioral specialist as noted above.  We will switch from Prozac to Celexa which will hopefully help some with his depression and anxiety symptoms and decrease his need to self medicate.  As noted above, we will also check for other metabolic causes for his mental health symptoms.  He will follow-up with me in 4 to 6 weeks.  I spent approximately 15 minutes counseling patient on his alcohol abuse.  This time was independent of the time spent on counseling and coordinating care for his other medical issues that were discussed during his office visit today.

## 2018-05-13 NOTE — Assessment & Plan Note (Signed)
Differential at this point includes stroke versus peripheral neuropathy.  Check CBC, CMP, TSH, B12.  Check brain MRI to rule out stroke.  Given that symptoms have been persistent for several weeks we do not need to obtain urgent imaging at this time.

## 2018-05-14 ENCOUNTER — Other Ambulatory Visit: Payer: Self-pay | Admitting: Family Medicine

## 2018-05-14 DIAGNOSIS — R945 Abnormal results of liver function studies: Secondary | ICD-10-CM

## 2018-05-14 DIAGNOSIS — R202 Paresthesia of skin: Secondary | ICD-10-CM

## 2018-05-14 DIAGNOSIS — R7989 Other specified abnormal findings of blood chemistry: Secondary | ICD-10-CM

## 2018-05-14 DIAGNOSIS — D539 Nutritional anemia, unspecified: Secondary | ICD-10-CM

## 2018-05-14 DIAGNOSIS — M21372 Foot drop, left foot: Secondary | ICD-10-CM

## 2018-05-24 ENCOUNTER — Other Ambulatory Visit (INDEPENDENT_AMBULATORY_CARE_PROVIDER_SITE_OTHER): Payer: Medicare Other

## 2018-05-24 DIAGNOSIS — R7989 Other specified abnormal findings of blood chemistry: Secondary | ICD-10-CM | POA: Diagnosis not present

## 2018-05-24 DIAGNOSIS — R945 Abnormal results of liver function studies: Secondary | ICD-10-CM | POA: Diagnosis not present

## 2018-05-24 DIAGNOSIS — D539 Nutritional anemia, unspecified: Secondary | ICD-10-CM | POA: Diagnosis not present

## 2018-05-24 DIAGNOSIS — M21372 Foot drop, left foot: Secondary | ICD-10-CM

## 2018-05-24 DIAGNOSIS — R202 Paresthesia of skin: Secondary | ICD-10-CM

## 2018-05-24 LAB — T4, FREE: Free T4: 1.17 ng/dL (ref 0.60–1.60)

## 2018-05-24 LAB — COMPREHENSIVE METABOLIC PANEL
ALBUMIN: 3.6 g/dL (ref 3.5–5.2)
ALK PHOS: 79 U/L (ref 39–117)
ALT: 20 U/L (ref 0–53)
AST: 21 U/L (ref 0–37)
BILIRUBIN TOTAL: 0.5 mg/dL (ref 0.2–1.2)
BUN: 14 mg/dL (ref 6–23)
CALCIUM: 9.3 mg/dL (ref 8.4–10.5)
CO2: 31 mEq/L (ref 19–32)
CREATININE: 0.95 mg/dL (ref 0.40–1.50)
Chloride: 105 mEq/L (ref 96–112)
GFR: 82.66 mL/min (ref >60.00)
Glucose, Bld: 98 mg/dL (ref 70–99)
Potassium: 4.9 mEq/L (ref 3.5–5.1)
Sodium: 142 mEq/L (ref 135–145)
TOTAL PROTEIN: 5.7 g/dL — AB (ref 6.0–8.3)

## 2018-05-24 LAB — FOLATE: Folate: 9 ng/mL (ref >5.9)

## 2018-05-24 LAB — TSH: TSH: 0.22 u[IU]/mL — ABNORMAL LOW (ref 0.35–4.50)

## 2018-05-24 LAB — T3, FREE: T3 FREE: 3.8 pg/mL (ref 2.3–4.2)

## 2018-05-26 LAB — METHYLMALONIC ACID, SERUM: METHYLMALONIC ACID, QUANT: 329 nmol/L — AB (ref 87–318)

## 2018-05-27 NOTE — Progress Notes (Signed)
Please inform patient of the following:  He is low in B12. Would like for him to start on B12 protocol. I think this would help with his symptoms.   His thyroid is borderline. Recommend rechecking again in 3 months. Please place future orders for TSH, free T3 and free T4.  [name]. [name], MD 05/27/2018 5:21 PM

## 2018-05-28 ENCOUNTER — Other Ambulatory Visit: Payer: Self-pay

## 2018-05-28 DIAGNOSIS — R7989 Other specified abnormal findings of blood chemistry: Secondary | ICD-10-CM

## 2018-05-28 DIAGNOSIS — D539 Nutritional anemia, unspecified: Secondary | ICD-10-CM

## 2018-05-31 ENCOUNTER — Telehealth: Payer: Self-pay | Admitting: Family Medicine

## 2018-05-31 NOTE — Telephone Encounter (Signed)
Patient appt scheduled

## 2018-05-31 NOTE — Telephone Encounter (Signed)
See note  Copied from [place] [id number]. Topic: Appointment Scheduling - Scheduling Inquiry for Clinic >> May 31, 2018  2:08 PM Sheran Luz wrote: Reason for CRM: Patient states he was advised at last OV that he would need to receive B12 injections. Patient was unsure of how many injections that are needed and when to schedule. Please advise.  Patient states that his phone does not always work well where he is location but will call back later today in the event he cannot be reached.

## 2018-06-01 ENCOUNTER — Ambulatory Visit (INDEPENDENT_AMBULATORY_CARE_PROVIDER_SITE_OTHER): Payer: Medicare Other

## 2018-06-01 DIAGNOSIS — E538 Deficiency of other specified B group vitamins: Secondary | ICD-10-CM | POA: Diagnosis not present

## 2018-06-01 MED ORDER — CYANOCOBALAMIN 1000 MCG/ML IJ SOLN
1000.0000 ug | Freq: Once | INTRAMUSCULAR | Status: AC
  Administered 2018-06-01: 1000 ug via INTRAMUSCULAR

## 2018-06-01 NOTE — Progress Notes (Signed)
I have reviewed the patient's encounter and agree with the documentation.  Tyrone [name]. [name], MD 06/01/2018 12:14 PM

## 2018-06-01 NOTE — Progress Notes (Signed)
Per orders of DrParker, injection of Vitamin B 12 given by Loralyn Freshwater.Given in the right deltoid.Patient tolerated injection well.

## 2018-06-08 ENCOUNTER — Ambulatory Visit (INDEPENDENT_AMBULATORY_CARE_PROVIDER_SITE_OTHER): Payer: Medicare Other

## 2018-06-08 DIAGNOSIS — E538 Deficiency of other specified B group vitamins: Secondary | ICD-10-CM | POA: Diagnosis not present

## 2018-06-08 MED ORDER — CYANOCOBALAMIN 1000 MCG/ML IJ SOLN
1000.0000 ug | Freq: Once | INTRAMUSCULAR | Status: AC
  Administered 2018-06-08: 1000 ug via INTRAMUSCULAR

## 2018-06-08 NOTE — Progress Notes (Signed)
Per orders of Dr. [name], injection of vitamin B12 1000 mcg given in right deltoid by Gertie Exon, CMA.  Patient tolerated injection well.

## 2018-06-15 ENCOUNTER — Ambulatory Visit (INDEPENDENT_AMBULATORY_CARE_PROVIDER_SITE_OTHER): Payer: Medicare Other

## 2018-06-15 DIAGNOSIS — E538 Deficiency of other specified B group vitamins: Secondary | ICD-10-CM | POA: Diagnosis not present

## 2018-06-15 MED ORDER — CYANOCOBALAMIN 1000 MCG/ML IJ SOLN
1000.0000 ug | Freq: Once | INTRAMUSCULAR | Status: AC
  Administered 2018-06-15: 1000 ug via INTRAMUSCULAR

## 2018-06-15 NOTE — Progress Notes (Signed)
I have reviewed the patient's encounter and agree with the documentation.  [name]. [name], MD 06/15/2018 12:29 PM

## 2018-06-15 NOTE — Progress Notes (Signed)
Per orders of Dr.Parker, injection of Vitamin B 12 given by Loralyn Freshwater. Given in left deltoid.Patient tolerated injection well.

## 2018-07-15 NOTE — Progress Notes (Deleted)
Per orders of Dr.Parker , injection of B-12 given by Francella Solian right. Patient tolerated injection well.

## 2018-07-16 ENCOUNTER — Ambulatory Visit (INDEPENDENT_AMBULATORY_CARE_PROVIDER_SITE_OTHER): Payer: Medicare Other

## 2018-07-16 DIAGNOSIS — E538 Deficiency of other specified B group vitamins: Secondary | ICD-10-CM

## 2018-07-16 MED ORDER — CYANOCOBALAMIN 1000 MCG/ML IJ SOLN
1000.0000 ug | Freq: Once | INTRAMUSCULAR | Status: AC
  Administered 2018-07-16: 1000 ug via INTRAMUSCULAR

## 2018-07-16 NOTE — Patient Instructions (Signed)
Health Maintenance Due  Topic Date Due  . Hepatitis C Screening  [birth year]  . PNA vac Low Risk Adult (1 of 2 - PCV13) 08/31/2010  . COLONOSCOPY  06/05/2018    Depression screen PHQ 2/9 05/13/2018  Decreased Interest 3  Down, Depressed, Hopeless 2  PHQ - 2 Score 5  Altered sleeping 3  Tired, decreased energy 3  Change in appetite 3  Feeling bad or failure about yourself  0  Trouble concentrating 0  Moving slowly or fidgety/restless 2  Suicidal thoughts 0  PHQ-9 Score 16

## 2018-07-16 NOTE — Progress Notes (Signed)
Per orders of Dr. [name], injection of B12 given in right deltoid by Franco Collet. Patient tolerated injection well.

## 2018-07-20 ENCOUNTER — Ambulatory Visit (INDEPENDENT_AMBULATORY_CARE_PROVIDER_SITE_OTHER): Payer: Medicare Other | Admitting: Family Medicine

## 2018-07-20 ENCOUNTER — Encounter: Payer: Self-pay | Admitting: Family Medicine

## 2018-07-20 VITALS — BP 126/72 | HR 94 | Temp 98.4°F | Ht 70.0 in | Wt 194.0 lb

## 2018-07-20 DIAGNOSIS — F321 Major depressive disorder, single episode, moderate: Secondary | ICD-10-CM

## 2018-07-20 DIAGNOSIS — F1010 Alcohol abuse, uncomplicated: Secondary | ICD-10-CM

## 2018-07-20 DIAGNOSIS — M21372 Foot drop, left foot: Secondary | ICD-10-CM

## 2018-07-20 DIAGNOSIS — E538 Deficiency of other specified B group vitamins: Secondary | ICD-10-CM

## 2018-07-20 DIAGNOSIS — F419 Anxiety disorder, unspecified: Secondary | ICD-10-CM

## 2018-07-20 DIAGNOSIS — R5383 Other fatigue: Secondary | ICD-10-CM | POA: Insufficient documentation

## 2018-07-20 DIAGNOSIS — R7989 Other specified abnormal findings of blood chemistry: Secondary | ICD-10-CM

## 2018-07-20 NOTE — Progress Notes (Signed)
Chief Complaint:  Tyrone [name] is a 73 y.o. male who presents today with a chief complaint of fatigue.   Assessment/Plan:  B12 deficiency Continue parenteral replacement.  Follow-up in 3 to 6 months to recheck B12 and MMA.   Abnormal TSH Appears to be subclinical hyperthyroidism based on last labs.  He will follow-up in 3 to 6 months.  Recheck TSH, T3, and T4 at that point.  Fatigue Multifactorial in setting of B12 deficiency and alcohol abuse.  Given the symptoms seem to be improving, would not pursue further work-up at this point.  Continue abstinence from alcohol and B12 replacement.  Follow-up in 3 to 6 months.  Depression, major, single episode, moderate ([facility]) PHQ score significantly improved.  Continue Celexa 40 mg daily.  Follow-up with me in 3 to 6 months.  Anxiety GAD score significantly improved.  Continue Celexa 40 mg daily.  Follow-up in 3 to 6 months.  Alcohol abuse Congratulated patient on cutting down alcohol use.  Encouraged continued abstinence.  Left foot drop Improving.  Possibly secondary to B12 deficiency and alcohol abuse.  Given the symptoms seem to be improving, would not pursue further work-up at this point.  Discussed home exercise program including toe lifts.  Regaining strength.  Declined PT referral.    Subjective:  HPI:  Fatigue, chronic problem Seen about 3 months ago for this.  He was found to have B12 deficiency and abnormal thyroid levels.  Started on B12 replacement.  Symptoms have improved over the last several months.  Is also having significant alcohol abuse-see below problem.  He is now drinking only a few beers per week and has noticed significant improvement in his fatigue.  Still has a lot of issues but overall seems to be doing much better.  Depression/Anxiety, established problems, Improving Also seen about 3 months ago for this.  We switched him from Prozac to Celexa 40 mg daily.  He has done well on this.  Overall, feels like his  mood is much better.  No significant side effects.  Depression screen [facility] 2/9 07/20/2018  Decreased Interest 0  Down, Depressed, Hopeless 0  PHQ - 2 Score 0  Altered sleeping 2  Tired, decreased energy 2  Change in appetite 0  Feeling bad or failure about yourself  0  Trouble concentrating 0  Moving slowly or fidgety/restless 1  Suicidal thoughts 0  PHQ-9 Score 5    GAD 7 : Generalized Anxiety Score 07/20/2018  Nervous, Anxious, on Edge 1  Control/stop worrying 0  Worry too much - different things 0  Trouble relaxing 1  Restless 1  Easily annoyed or irritable 0  Afraid - awful might happen 0  Total GAD 7 Score 3  Anxiety Difficulty Not difficult at all   Foot Drop, established problem, improving Patient also seen about 3 months ago for this.  MRI was ordered however patient never had this done.  Symptoms seem to be improving with B12 replacement.  Alcohol Abuse As noted above, patient has significantly decreased his alcohol intake.  Is no longer drinking liquid.  Is on drinking a few beers per week.   ROS: Per HPI  PMH: He reports that he quit smoking about 38 years ago. He quit smokeless tobacco use about 11 years ago.  His smokeless tobacco use included chew. He reports current alcohol use. He reports that he does not use drugs.      Objective:  Physical Exam: BP 126/72 (BP Location: Left Arm, Patient Position:  Sitting, Cuff Size: Normal)   Pulse 94   Temp 98.4 F (36.9 C) (Oral)   Ht 5\' 10"  (1.778 m)   Wt 194 lb (88 kg)   SpO2 98%   BMI 27.84 kg/m   Wt Readings from Last 3 Encounters:  07/20/18 194 lb (88 kg)  05/13/18 178 lb 4 oz (80.9 kg)  10/11/17 198 lb (89.8 kg)  Gen: NAD, resting comfortably CV: Regular rate and rhythm with no murmurs appreciated Pulm: Normal work of breathing, clear to auscultation bilaterally with no crackles, wheezes, or rhonchi Neuro: 4 out of 5 dorsiflexion in left foot.  5 out of 5 plantar flexion.  Sensation to light touch intact  throughout.     [name]. [name], MD 07/20/2018 9:29 AM

## 2018-07-20 NOTE — Assessment & Plan Note (Signed)
Appears to be subclinical hyperthyroidism based on last labs.  He will follow-up in 3 to 6 months.  Recheck TSH, T3, and T4 at that point.

## 2018-07-20 NOTE — Assessment & Plan Note (Signed)
Congratulated patient on cutting down alcohol use.  Encouraged continued abstinence.

## 2018-07-20 NOTE — Assessment & Plan Note (Signed)
Improving.  Possibly secondary to B12 deficiency and alcohol abuse.  Given the symptoms seem to be improving, would not pursue further work-up at this point.  Discussed home exercise program including toe lifts.  Regaining strength.  Declined PT referral.

## 2018-07-20 NOTE — Assessment & Plan Note (Signed)
Multifactorial in setting of B12 deficiency and alcohol abuse.  Given the symptoms seem to be improving, would not pursue further work-up at this point.  Continue abstinence from alcohol and B12 replacement.  Follow-up in 3 to 6 months.

## 2018-07-20 NOTE — Assessment & Plan Note (Signed)
PHQ score significantly improved.  Continue Celexa 40 mg daily.  Follow-up with me in 3 to 6 months.

## 2018-07-20 NOTE — Patient Instructions (Signed)
It was very nice to see you today!  Keep up the good work!  Come back in 3-6 months, or sooner as needed.  Take care, Dr [name]

## 2018-07-20 NOTE — Assessment & Plan Note (Signed)
Continue parenteral replacement.  Follow-up in 3 to 6 months to recheck B12 and MMA.

## 2018-07-20 NOTE — Assessment & Plan Note (Signed)
GAD score significantly improved.  Continue Celexa 40 mg daily.  Follow-up in 3 to 6 months.

## 2018-08-12 ENCOUNTER — Ambulatory Visit: Payer: Medicare Other

## 2018-08-18 DIAGNOSIS — S0990XA Unspecified injury of head, initial encounter: Secondary | ICD-10-CM | POA: Diagnosis not present

## 2018-08-18 DIAGNOSIS — W19XXXA Unspecified fall, initial encounter: Secondary | ICD-10-CM | POA: Diagnosis not present

## 2018-08-18 DIAGNOSIS — R0902 Hypoxemia: Secondary | ICD-10-CM | POA: Diagnosis not present

## 2018-08-23 ENCOUNTER — Other Ambulatory Visit: Payer: Self-pay | Admitting: Family Medicine

## 2018-09-03 ENCOUNTER — Telehealth: Payer: Self-pay | Admitting: Family Medicine

## 2018-09-03 NOTE — Telephone Encounter (Signed)
Spoke with pharmacy notified only Celexa was on his med list.

## 2018-09-03 NOTE — Telephone Encounter (Signed)
Copied from [place] [id number]. Topic: Quick Communication - Rx Refill/Question >> Sep 03, 2018  1:49 PM Nils Flack, Marland Kitchen wrote: Medication: escitalopram - and celexa   Has the patient contacted their pharmacy? Yes pharm called  (Agent: If no, request that the patient contact the pharmacy for the refill.) (Agent: If yes, when and what did the pharmacy advise?)  Preferred Pharmacy (with phone number or street name): [phone] [facility] Pt is taking both meds listed above. Pharm wants to make sure this is ok    Agent: Please be advised that RX refills may take up to 3 business days. We ask that you follow-up with your pharmacy.

## 2018-09-03 NOTE — Telephone Encounter (Signed)
See note

## 2018-09-17 DIAGNOSIS — L2089 Other atopic dermatitis: Secondary | ICD-10-CM | POA: Diagnosis not present

## 2018-09-17 DIAGNOSIS — Z0189 Encounter for other specified special examinations: Secondary | ICD-10-CM | POA: Diagnosis not present

## 2018-09-17 DIAGNOSIS — L011 Impetiginization of other dermatoses: Secondary | ICD-10-CM | POA: Diagnosis not present

## 2018-09-30 ENCOUNTER — Ambulatory Visit (INDEPENDENT_AMBULATORY_CARE_PROVIDER_SITE_OTHER): Payer: Medicare Other | Admitting: Family Medicine

## 2018-09-30 ENCOUNTER — Telehealth: Payer: Self-pay | Admitting: Family Medicine

## 2018-09-30 DIAGNOSIS — R1013 Epigastric pain: Secondary | ICD-10-CM | POA: Diagnosis not present

## 2018-09-30 DIAGNOSIS — M545 Low back pain, unspecified: Secondary | ICD-10-CM

## 2018-09-30 MED ORDER — PANTOPRAZOLE SODIUM 40 MG PO TBEC: 40.0000 mg | DELAYED_RELEASE_TABLET | Freq: Two times a day (BID) | ORAL | 3 refills | Status: DC

## 2018-09-30 MED ORDER — CYCLOBENZAPRINE HCL 10 MG PO TABS: 10.0000 mg | ORAL_TABLET | Freq: Three times a day (TID) | ORAL | 0 refills | Status: DC | PRN

## 2018-09-30 MED ORDER — PREDNISONE 50 MG PO TABS: ORAL_TABLET | ORAL | 0 refills | Status: DC

## 2018-09-30 NOTE — Telephone Encounter (Signed)
Please see below. Thanks!

## 2018-09-30 NOTE — Telephone Encounter (Signed)
Patient called in saying that he was having stomach and back pain patient wanted to see if he could come into the office since he doesn't have a smart phone or computer with a camera. Patient would like to come in today 09/30/18.

## 2018-09-30 NOTE — Progress Notes (Signed)
   Chief Complaint:  Tyrone [name] is a 73 y.o. male who presents for a telephone visit with a chief complaint of abdominal discomfort.    Assessment/Plan:  Abdominal Discomfort No redflags.  No signs of peritonitis.  Possibly gastritis/peptic ulcer.  Will start course of Protonix.  Advised patient to follow-up in our clinic within the next 1 to 2 weeks for blood work including CBC and C met.  Discussed reasons to return to care and seek emergent care.  May need referral to GI if no improvement with above.  Back pain No red flags.  Will avoid NSAIDs for the time being given the concern for possible PUD/gastritis.  Start course of prednisone and Flexeril.  Discussed reasons to return to care or seek emergent care.    Subjective:  HPI:  Abdominal Discomfort Started about 4 days ago. Located in upper abdomen. Worse after eating.  No specific treatments tried.  No obvious precipitating events.  He has had gastric ulcers in the past and this feels similar to this.  He has had noticed a little darker stools lately.  No hematochezia.  He has had some intermittent fevers and chills.  No dysuria or hematuria.  Low back pain Started 2 to 3 months ago.  Located in lower back and radiates occasionally into his legs.  He has not tried anything for the pain.  No weakness or numbness.  No reported bowel or bladder incontinence.  No reported urinary retention.  ROS: Per HPI  PMH: He reports that he quit smoking about 38 years ago. He quit smokeless tobacco use about 11 years ago.  His smokeless tobacco use included chew. He reports current alcohol use. He reports that he does not use drugs.      Objective/Observations   NAD, speaking in full sentences  Telephone Visit   I connected with [name] on 09/30/18 at  9:20 AM EDT via telephone and verified that I am speaking with the correct person using two identifiers. I discussed the limitations of evaluation and management by telemedicine  and the availability of in person appointments. The patient expressed understanding and agreed to proceed.   Patient location: Home Provider location: [place] participating in the virtual visit: Myself and Patient  A total of 13 minutes were spent on medical discussion.      [name]. [name], MD 09/30/2018 10:44 AM

## 2018-09-30 NOTE — Telephone Encounter (Signed)
[place] for office visit if no fever, cough, etc.

## 2018-09-30 NOTE — Telephone Encounter (Signed)
Please advise 

## 2018-12-01 ENCOUNTER — Encounter: Payer: Medicare Other | Admitting: Family Medicine

## 2018-12-03 ENCOUNTER — Ambulatory Visit (INDEPENDENT_AMBULATORY_CARE_PROVIDER_SITE_OTHER): Payer: Medicare Other | Admitting: Family Medicine

## 2018-12-03 ENCOUNTER — Other Ambulatory Visit: Payer: Self-pay

## 2018-12-03 ENCOUNTER — Encounter: Payer: Self-pay | Admitting: Family Medicine

## 2018-12-03 VITALS — BP 128/82 | HR 108 | Temp 98.4°F | Ht 70.0 in | Wt 196.2 lb

## 2018-12-03 DIAGNOSIS — E0590 Thyrotoxicosis, unspecified without thyrotoxic crisis or storm: Secondary | ICD-10-CM | POA: Diagnosis not present

## 2018-12-03 DIAGNOSIS — F419 Anxiety disorder, unspecified: Secondary | ICD-10-CM

## 2018-12-03 DIAGNOSIS — M545 Low back pain, unspecified: Secondary | ICD-10-CM

## 2018-12-03 DIAGNOSIS — G8929 Other chronic pain: Secondary | ICD-10-CM | POA: Diagnosis not present

## 2018-12-03 DIAGNOSIS — F321 Major depressive disorder, single episode, moderate: Secondary | ICD-10-CM | POA: Diagnosis not present

## 2018-12-03 DIAGNOSIS — M25562 Pain in left knee: Secondary | ICD-10-CM | POA: Diagnosis not present

## 2018-12-03 DIAGNOSIS — E538 Deficiency of other specified B group vitamins: Secondary | ICD-10-CM | POA: Diagnosis not present

## 2018-12-03 DIAGNOSIS — Z13220 Encounter for screening for lipoid disorders: Secondary | ICD-10-CM

## 2018-12-03 DIAGNOSIS — R7989 Other specified abnormal findings of blood chemistry: Secondary | ICD-10-CM | POA: Diagnosis not present

## 2018-12-03 DIAGNOSIS — Z125 Encounter for screening for malignant neoplasm of prostate: Secondary | ICD-10-CM

## 2018-12-03 DIAGNOSIS — M109 Gout, unspecified: Secondary | ICD-10-CM

## 2018-12-03 DIAGNOSIS — Z1159 Encounter for screening for other viral diseases: Secondary | ICD-10-CM | POA: Diagnosis not present

## 2018-12-03 DIAGNOSIS — Z0000 Encounter for general adult medical examination without abnormal findings: Secondary | ICD-10-CM | POA: Diagnosis not present

## 2018-12-03 DIAGNOSIS — R739 Hyperglycemia, unspecified: Secondary | ICD-10-CM | POA: Diagnosis not present

## 2018-12-03 DIAGNOSIS — Z1211 Encounter for screening for malignant neoplasm of colon: Secondary | ICD-10-CM

## 2018-12-03 DIAGNOSIS — D692 Other nonthrombocytopenic purpura: Secondary | ICD-10-CM | POA: Insufficient documentation

## 2018-12-03 LAB — LIPID PANEL
Cholesterol: 178 mg/dL (ref 0–200)
HDL: 100.7 mg/dL (ref >39.00)
LDL Cholesterol: 67 mg/dL (ref 0–99)
NonHDL: 77.07
Total CHOL/HDL Ratio: 2
Triglycerides: 51 mg/dL (ref 0.0–149.0)
VLDL: 10.2 mg/dL (ref 0.0–40.0)

## 2018-12-03 LAB — COMPREHENSIVE METABOLIC PANEL
ALT: 12 U/L (ref 0–53)
AST: 17 U/L (ref 0–37)
Albumin: 4.2 g/dL (ref 3.5–5.2)
Alkaline Phosphatase: 75 U/L (ref 39–117)
BUN: 18 mg/dL (ref 6–23)
CO2: 30 mEq/L (ref 19–32)
Calcium: 9.3 mg/dL (ref 8.4–10.5)
Chloride: 103 mEq/L (ref 96–112)
Creatinine, Ser: 1 mg/dL (ref 0.40–1.50)
GFR: 73.19 mL/min (ref >60.00)
Glucose, Bld: 97 mg/dL (ref 70–99)
Potassium: 4.7 mEq/L (ref 3.5–5.1)
Sodium: 140 mEq/L (ref 135–145)
Total Bilirubin: 1.1 mg/dL (ref 0.2–1.2)
Total Protein: 6.5 g/dL (ref 6.0–8.3)

## 2018-12-03 LAB — T4, FREE: Free T4: 0.85 ng/dL (ref 0.60–1.60)

## 2018-12-03 LAB — CBC
HCT: 37.1 % — ABNORMAL LOW (ref 39.0–52.0)
Hemoglobin: 11.9 g/dL — ABNORMAL LOW (ref 13.0–17.0)
MCHC: 31.9 g/dL (ref 30.0–36.0)
MCV: 98.2 fl (ref 78.0–100.0)
Platelets: 245 10*3/uL (ref 150.0–400.0)
RBC: 3.78 Mil/uL — ABNORMAL LOW (ref 4.22–5.81)
RDW: 15.2 % (ref 11.5–15.5)
WBC: 6 10*3/uL (ref 4.0–10.5)

## 2018-12-03 LAB — T3, FREE: T3, Free: 3.1 pg/mL (ref 2.3–4.2)

## 2018-12-03 LAB — URIC ACID: Uric Acid, Serum: 5.3 mg/dL (ref 4.0–7.8)

## 2018-12-03 LAB — VITAMIN B12: Vitamin B-12: 874 pg/mL (ref 211–911)

## 2018-12-03 LAB — HEMOGLOBIN A1C: Hgb A1c MFr Bld: 5.4 % (ref 4.6–6.5)

## 2018-12-03 LAB — PSA, MEDICARE: PSA: 0.3 ng/ml (ref 0.10–4.00)

## 2018-12-03 LAB — TSH: TSH: 0.13 u[IU]/mL — ABNORMAL LOW (ref 0.35–4.50)

## 2018-12-03 NOTE — Progress Notes (Signed)
Chief Complaint:  Tyrone [name] is a 73 y.o. male who presents today for a subsequent Medicare Annual Wellness Visit and to discuss management of his chronic medical problems.  Assessment/Plan:  Left knee pain Likely has underlying osteoarthritis.  He also has low back pain and could be having some radicular pain as well.  He has very slight weakness with extension.  He will continue taking over-the-counter Aleve and Tylenol.  Please referral to sports medicine for further evaluation.  Senile purpura (HCC) Due to NSAIDs.  Reassured patient.  Gout Stable.  Continue allopurinol 300 mg daily.  Check uric acid level.  Abnormal TSH Check TSH, free T4, and free T3.  B12 deficiency Check CBC, C met, B12, and MMA.  Anxiety Stable off medications.   Depression, major, single episode, moderate (HCC) Stable off medications.   Body mass index is 28.15 kg/m. / Overweight BMI Metric Follow Up - 12/03/18 1037      BMI Metric Follow Up-Please document annually   BMI Metric Follow Up  Education provided        Preventative Healthcare Due for colonoscopy this year - he will follow up with GI Due for flu vaccine later this year Records not MR showed he is due for pneumonia vaccine however patient is adamant that he had this done.  He will obtain records from previous PCP.  Will readdress at future office visit.  We will check hep C antibody and PSA today.  Health Maintenance Due  Topic Date Due  . Hepatitis C Screening  1945-11-16  . PNA vac Low Risk Adult (1 of 2 - PCV13) 08/31/2010  . COLONOSCOPY  06/05/2018    During the course of the visit the patient was educated and counseled about appropriate screening and preventive services including:        Fall prevention   Nutrition Physical Activity Weight Management Cognition    Subjective:  HPI:  Health Risk Assessment: Patient considers his overall health to be good. He has no difficulty performing the following: .  Preparing food and eating . Bathing  . Getting dressed . Using the toilet . Shopping . Managing Finances . Moving around from place to place  He has had a couple of falls over the past year due to his knee and left leg giving out.  Had minor injuries.  No loss of consciousness or head trauma.  Depression screen [facility] 2/9 12/03/2018  Decreased Interest 0  Down, Depressed, Hopeless 0  PHQ - 2 Score 0  Altered sleeping 1  Tired, decreased energy 1  Change in appetite 0  Feeling bad or failure about yourself  0  Trouble concentrating 0  Moving slowly or fidgety/restless 0  Suicidal thoughts 0  PHQ-9 Score 2  Difficult doing work/chores Not difficult at all    Lifestyle Factors: Diet: Balanced. Tries to eat plenty of fruits and vegetables.  Exercise: Active at work.   Patient Care Team: Vivi Barrack, MD as PCP - General (Family Medicine)   He has no acute complaints today.   He has had some issues with intermittent low back pain and left knee pain for the past several years.  Symptoms seem to be improving however still has occasional pain.  Occasionally his left knee will buckle and give out on him.  Also occasionally has popping and crackling to the area as well.  No specific treatments tried other than occasional Aleve or Tylenol.  These medications seem to help with his symptoms.  His chronic medical conditions are outlined below:  # Gout - On allopurinol 33m daily and tolerating well - No recently flares  # GERD - Takes protonix 485mtwice daily and tolerating well  # B12 Deficiency - Was on injections, but is now taking 100023mdaily and tolerating well  # Depression / Anxiety -Doing well off medications.  ROS: Per HPI  PMH:  The following were reviewed and entered/updated in epic: Past Medical History:  Diagnosis Date  . Anemia   . Arthritis    hands  . Blood transfusion without reported diagnosis    for stomach ulcers  . Depression   . Hypertension    . Kidney stone   . Multiple duodenal ulcers    Past Surgical History:  Procedure Laterality Date  . ABDOMINAL SURGERY     ulcers  . COLONOSCOPY    . LITHOTRIPSY     Family History  Problem Relation Age of Onset  . Colon cancer Neg Hx     Medications- reviewed and updated Current Outpatient Medications  Medication Sig Dispense Refill  . allopurinol (ZYLOPRIM) 300 MG tablet Take 1 tablet by mouth daily.  1  . cyclobenzaprine (FLEXERIL) 10 MG tablet Take 1 tablet (10 mg total) by mouth 3 (three) times daily as needed for muscle spasms. 30 tablet 0  . pantoprazole (PROTONIX) 40 MG tablet Take 1 tablet (40 mg total) by mouth 2 (two) times daily. 60 tablet 3   No current facility-administered medications for this visit.     Allergies-reviewed and updated No Known Allergies  Social History   Socioeconomic History  . Marital status: Married    Spouse name: Not on file  . Number of children: Not on file  . Years of education: Not on file  . Highest education level: Not on file  Occupational History  . Not on file  Social Needs  . Financial resource strain: Not on file  . Food insecurity    Worry: Not on file    Inability: Not on file  . Transportation needs    Medical: Not on file    Non-medical: Not on file  Tobacco Use  . Smoking status: Former Smoker    Quit date: 05/30/1980    Years since quitting: 38.5  . Smokeless tobacco: Former UseSystems developer Types: [employer]: 05/30/2007  Substance and Sexual Activity  . Alcohol use: Yes    Alcohol/week: 0.0 standard drinks    Comment: rarely per pt/heavy per wife  . Drug use: No  . Sexual activity: Not on file  Lifestyle  . Physical activity    Days per week: Not on file    Minutes per session: Not on file  . Stress: Not on file  Relationships  . Social conHerbalist phone: Not on file    Gets together: Not on file    Attends religious service: Not on file    Active member of club or organization: Not on  file    Attends meetings of clubs or organizations: Not on file    Relationship status: Not on file  Other Topics Concern  . Not on file  Social History Narrative  . Not on file         Objective/Observations  Physical Exam: BP 128/82 (BP Location: Left Arm, Patient Position: Sitting, Cuff Size: Normal)   Pulse (!) 108   Temp 98.4 F (36.9 C) (Oral)   Ht '5\' 10"'  (1.778  m)   Wt 196 lb 3.2 oz (89 kg)   SpO2 98%   BMI 28.15 kg/m  Gen: NAD, resting comfortably HEENT: TMs normal bilaterally. OP clear. No thyromegaly noted.  CV: RRR with no murmurs appreciated Pulm: NWOB, CTAB with no crackles, wheezes, or rhonchi GI: Normal bowel sounds present. Soft, Nontender, Nondistended. MSK: no edema, cyanosis, or clubbing noted.  Bilateral knees with slight hypertrophy and mild crepitus with active range of motion.  Strength 5/5 with bilateral knee extension, however left seems to be slightly weaker than right.  Neurovascularly intact distally. Skin: warm, dry.  Scattered purpura across upper extremities. Neuro: CN2-12 grossly intact. Strength 5/5 in upper and lower extremities. Reflexes symmetric and intact bilaterally. No apparent cognitive deficits.  Psych: Normal affect and thought content      Caleb M. [name], MD 12/03/2018 10:39 AM

## 2018-12-03 NOTE — Assessment & Plan Note (Signed)
Check CBC, C met, B12, and MMA.

## 2018-12-03 NOTE — Assessment & Plan Note (Signed)
Likely has underlying osteoarthritis.  He also has low back pain and could be having some radicular pain as well.  He has very slight weakness with extension.  He will continue taking over-the-counter Aleve and Tylenol.  Please referral to sports medicine for further evaluation.

## 2018-12-03 NOTE — Assessment & Plan Note (Signed)
Stable.  Continue allopurinol 300 mg daily.  Check uric acid level.

## 2018-12-03 NOTE — Assessment & Plan Note (Signed)
Stable off medications.  

## 2018-12-03 NOTE — Assessment & Plan Note (Signed)
Due to NSAIDs.  Reassured patient.

## 2018-12-03 NOTE — Patient Instructions (Addendum)
It was very nice to see you today!  I will place a referral for you to see Dr. Tamala Julian.  I would like to get your records from most recent pneumonia vaccine.  You should be getting your colonoscopy later this year.  We will check blood work today.  Come back to see me 6 months, or sooner as needed.  Take care, Dr [name]  Please try these tips to maintain a healthy lifestyle:   Eat at least 3 REAL meals and 1-2 snacks per day.  Aim for no more than 5 hours between eating.  If you eat breakfast, please do so within one hour of getting up.    Obtain twice as many fruits/vegetables as protein or carbohydrate foods for both lunch and dinner. (Half of each meal should be fruits/vegetables, one quarter protein, and one quarter starchy carbs)   Cut down on sweet beverages. This includes juice, soda, and sweet tea.    Exercise at least 150 minutes every week.    Preventive Care 36 Years and Older, Male Preventive care refers to lifestyle choices and visits with your health care provider that can promote health and wellness. This includes:  A yearly physical exam. This is also called an annual well check.  Regular dental and eye exams.  Immunizations.  Screening for certain conditions.  Healthy lifestyle choices, such as diet and exercise. What can I expect for my preventive care visit? Physical exam Your health care provider will check:  Height and weight. These may be used to calculate body mass index (BMI), which is a measurement that tells if you are at a healthy weight.  Heart rate and blood pressure.  Your skin for abnormal spots. Counseling Your health care provider may ask you questions about:  Alcohol, tobacco, and drug use.  Emotional well-being.  Home and relationship well-being.  Sexual activity.  Eating habits.  History of falls.  Memory and ability to understand (cognition).  Work and work Statistician. What immunizations do I need?  Influenza  (flu) vaccine  This is recommended every year. Tetanus, diphtheria, and pertussis (Tdap) vaccine  You may need a Td booster every 10 years. Varicella (chickenpox) vaccine  You may need this vaccine if you have not already been vaccinated. Zoster (shingles) vaccine  You may need this after age 35. Pneumococcal conjugate (PCV13) vaccine  One dose is recommended after age 31. Pneumococcal polysaccharide (PPSV23) vaccine  One dose is recommended after age 53. Measles, mumps, and rubella (MMR) vaccine  You may need at least one dose of MMR if you were born in 1957 or later. You may also need a second dose. Meningococcal conjugate (MenACWY) vaccine  You may need this if you have certain conditions. Hepatitis A vaccine  You may need this if you have certain conditions or if you travel or work in places where you may be exposed to hepatitis A. Hepatitis B vaccine  You may need this if you have certain conditions or if you travel or work in places where you may be exposed to hepatitis B. Haemophilus influenzae type b (Hib) vaccine  You may need this if you have certain conditions. You may receive vaccines as individual doses or as more than one vaccine together in one shot (combination vaccines). Talk with your health care provider about the risks and benefits of combination vaccines. What tests do I need? Blood tests  Lipid and cholesterol levels. These may be checked every 5 years, or more frequently depending on your overall  health.  Hepatitis C test.  Hepatitis B test. Screening  Lung cancer screening. You may have this screening every year starting at age 76 if you have a 30-pack-year history of smoking and currently smoke or have quit within the past 15 years.  Colorectal cancer screening. All adults should have this screening starting at age 65 and continuing until age 46. Your health care provider may recommend screening at age 30 if you are at increased risk. You will  have tests every 1-10 years, depending on your results and the type of screening test.  Prostate cancer screening. Recommendations will vary depending on your family history and other risks.  Diabetes screening. This is done by checking your blood sugar (glucose) after you have not eaten for a while (fasting). You may have this done every 1-3 years.  Abdominal aortic aneurysm (AAA) screening. You may need this if you are a current or former smoker.  Sexually transmitted disease (STD) testing. Follow these instructions at home: Eating and drinking  Eat a diet that includes fresh fruits and vegetables, whole grains, lean protein, and low-fat dairy products. Limit your intake of foods with high amounts of sugar, saturated fats, and salt.  Take vitamin and mineral supplements as recommended by your health care provider.  Do not drink alcohol if your health care provider tells you not to drink.  If you drink alcohol: ? Limit how much you have to 0-2 drinks a day. ? Be aware of how much alcohol is in your drink. In the [place] one drink equals one 12 oz bottle of beer (355 mL), one 5 oz glass of wine (148 mL), or one 1 oz glass of hard liquor (44 mL). Lifestyle  Take daily care of your teeth and gums.  Stay active. Exercise for at least 30 minutes on 5 or more days each week.  Do not use any products that contain nicotine or tobacco, such as cigarettes, e-cigarettes, and chewing tobacco. If you need help quitting, ask your health care provider.  If you are sexually active, practice safe sex. Use a condom or other form of protection to prevent STIs (sexually transmitted infections).  Talk with your health care provider about taking a low-dose aspirin or statin. What's next?  Visit your health care provider once a year for a well check visit.  Ask your health care provider how often you should have your eyes and teeth checked.  Stay up to date on all vaccines. This information is not  intended to replace advice given to you by your health care provider. Make sure you discuss any questions you have with your health care provider. Document Released: 06/01/2015 Document Revised: 04/29/2018 Document Reviewed: 04/29/2018 Elsevier Patient Education  2020 [employer].

## 2018-12-03 NOTE — Assessment & Plan Note (Signed)
Check TSH, free T4, and free T3.

## 2018-12-08 LAB — METHYLMALONIC ACID, SERUM: Methylmalonic Acid, Quant: 114 nmol/L (ref 87–318)

## 2018-12-08 LAB — HEPATITIS C ANTIBODY
Hepatitis C Ab: NONREACTIVE
SIGNAL TO CUT-OFF: 0.01 (ref <1.00)

## 2018-12-09 NOTE — Progress Notes (Signed)
Please inform patient of the following:  One of his thyroid markers is just slightly low but the rest of his thyroid tests are NORMAL. He is slightly anemic, but the rest of his blood work is NORMAL. He should continue taking the B12 and folate - I think this will help with his anemia.  Would like for him to keep up the good work and we can recheck his labs again in a year.  Tyrone [name]. [name], MD 12/09/2018 8:09 PM

## 2018-12-27 ENCOUNTER — Other Ambulatory Visit: Payer: Self-pay | Admitting: Family Medicine

## 2019-01-09 NOTE — Progress Notes (Signed)
[facility] [address], [place] [address] Phone: [phone] Subjective:   I [name] am serving as a Education administrator for Dr. Hulan Saas.  I'm seeing this patient by the request  of:  Vivi Barrack, MD   CC: Left knee pain  [id number]  Tyrone [name] is a 73 y.o. male coming in with complaint of left knee pain. Knee is not stable. States his knee gives way often and he falls. Numbness in his left toes. Wearing a compression sleeve.   Onset- 2-3 months  Location -left knee Character-dull ache sharp pain with certain movements more the instability Aggravating factors- walking, stairs  Therapies tried-compression sleeve Severity-8 out of 10 due to the instability but not truly pain     Past Medical History:  Diagnosis Date  . Anemia   . Arthritis    hands  . Blood transfusion without reported diagnosis    for stomach ulcers  . Depression   . Hypertension   . Kidney stone   . Multiple duodenal ulcers    Past Surgical History:  Procedure Laterality Date  . ABDOMINAL SURGERY     ulcers  . COLONOSCOPY    . LITHOTRIPSY     Social History   Socioeconomic History  . Marital status: Married    Spouse name: Not on file  . Number of children: Not on file  . Years of education: Not on file  . Highest education level: Not on file  Occupational History  . Not on file  Social Needs  . Financial resource strain: Not on file  . Food insecurity    Worry: Not on file    Inability: Not on file  . Transportation needs    Medical: Not on file    Non-medical: Not on file  Tobacco Use  . Smoking status: Former Smoker    Quit date: 05/30/1980    Years since quitting: 38.6  . Smokeless tobacco: Former Systems developer    Types: [place] date: 05/30/2007  Substance and Sexual Activity  . Alcohol use: Yes    Alcohol/week: 0.0 standard drinks    Comment: rarely per pt/heavy per wife  . Drug use: No  . Sexual activity: Not on file   Lifestyle  . Physical activity    Days per week: Not on file    Minutes per session: Not on file  . Stress: Not on file  Relationships  . Social Herbalist on phone: Not on file    Gets together: Not on file    Attends religious service: Not on file    Active member of club or organization: Not on file    Attends meetings of clubs or organizations: Not on file    Relationship status: Not on file  Other Topics Concern  . Not on file  Social History Narrative  . Not on file   No Known Allergies Family History  Problem Relation Age of Onset  . Colon cancer Neg Hx        Current Outpatient Medications (Analgesics):  .  allopurinol (ZYLOPRIM) 300 MG tablet, Take 1 tablet by mouth daily.   Current Outpatient Medications (Other):  .  cyclobenzaprine (FLEXERIL) 10 MG tablet, Take 1 tablet (10 mg total) by mouth 3 (three) times daily as needed for muscle spasms. .  pantoprazole (PROTONIX) 40 MG tablet, Take 1 tablet (40 mg total) by mouth 2 (two) times daily. Marland Kitchen  gabapentin (  NEURONTIN) 100 MG capsule, Take 2 capsules (200 mg total) by mouth at bedtime.    Past medical history, social, surgical and family history all reviewed in electronic medical record.  No pertanent information unless stated regarding to the chief complaint.   Review of Systems:  No headache, visual changes, nausea, vomiting, diarrhea, constipation, dizziness, abdominal pain, skin rash, fevers, chills, night sweats, weight loss, swollen lymph nodes, body aches, joint swelling, muscle aches, chest pain, shortness of breath, mood changes.   Objective  Blood pressure (!) 150/90, pulse (!) 115, height 5\' 10"  (1.778 m), weight 198 lb (89.8 kg), SpO2 91 %.    General: No apparent distress alert and oriented x3 mood and affect normal, dressed appropriately.  HEENT: Pupils equal, extraocular movements intact  Respiratory: Patient's speak in full sentences and does not appear short of breath   Cardiovascular: No lower extremity edema, non tender, no erythema  Skin: Warm dry intact with no signs of infection or rash on extremities or on axial skeleton.  Abdomen: Soft nontender  Neuro: Cranial nerves II through XII are intact, neurovascularly intact in all extremities with  2+ pulses.  Lymph: No lymphadenopathy of posterior or anterior cervical chain or axillae bilaterally.  Gait antalgic MSK: Trace tender with mild limited range of motion and good stability and symmetric strength and tone of shoulders, elbows, wrist, hipbilaterally.  Knee: Left Inspection fairly unremarkable.  Tender to palpation over medial and PF joint line.  ROM full in flexion and extension and lower leg rotation. instability with valgus force.  painful patellar compression. Patellar glide with moderate crepitus. Patellar and quadriceps tendons unremarkable. Hamstring and quadriceps strength is normal.  Patient does have some weakness with plantar flexion on the left side of the right.  3 out of 5 strength.  Deep tendon reflex 1+ of the Achilles on the left   Impression and Recommendations:     This case required medical decision making of moderate complexity. The above documentation has been reviewed and is accurate and complete Lyndal Pulley, DO       Note: This dictation was prepared with Dragon dictation along with smaller phrase technology. Any transcriptional errors that result from this process are unintentional.

## 2019-01-10 ENCOUNTER — Ambulatory Visit (INDEPENDENT_AMBULATORY_CARE_PROVIDER_SITE_OTHER): Payer: Medicare Other | Admitting: Family Medicine

## 2019-01-10 ENCOUNTER — Ambulatory Visit (INDEPENDENT_AMBULATORY_CARE_PROVIDER_SITE_OTHER)
Admission: RE | Admit: 2019-01-10 | Discharge: 2019-01-10 | Disposition: A | Discharge status: Home / Self Care | Payer: Medicare Other | Source: Ambulatory Visit | Attending: Family Medicine | Admitting: Family Medicine

## 2019-01-10 ENCOUNTER — Encounter: Payer: Self-pay | Admitting: Family Medicine

## 2019-01-10 ENCOUNTER — Ambulatory Visit: Payer: Self-pay

## 2019-01-10 ENCOUNTER — Other Ambulatory Visit: Payer: Self-pay

## 2019-01-10 VITALS — BP 150/90 | HR 115 | Ht 70.0 in | Wt 198.0 lb

## 2019-01-10 DIAGNOSIS — M25562 Pain in left knee: Secondary | ICD-10-CM

## 2019-01-10 DIAGNOSIS — G8929 Other chronic pain: Secondary | ICD-10-CM | POA: Diagnosis not present

## 2019-01-10 DIAGNOSIS — M545 Low back pain, unspecified: Secondary | ICD-10-CM

## 2019-01-10 DIAGNOSIS — M5416 Radiculopathy, lumbar region: Secondary | ICD-10-CM | POA: Diagnosis not present

## 2019-01-10 DIAGNOSIS — M21372 Foot drop, left foot: Secondary | ICD-10-CM

## 2019-01-10 IMAGING — DX LUMBAR SPINE - COMPLETE 4+ VIEW
5 series · 5 of 5 positions shown · non-contrast
Comparison: MRI lumbar spine [DATE].  Lumbar spine [DATE].

CLINICAL DATA: Low back pain.  No known injury.

EXAM:
LUMBAR SPINE - COMPLETE 4+ VIEW

[l-spine ap]
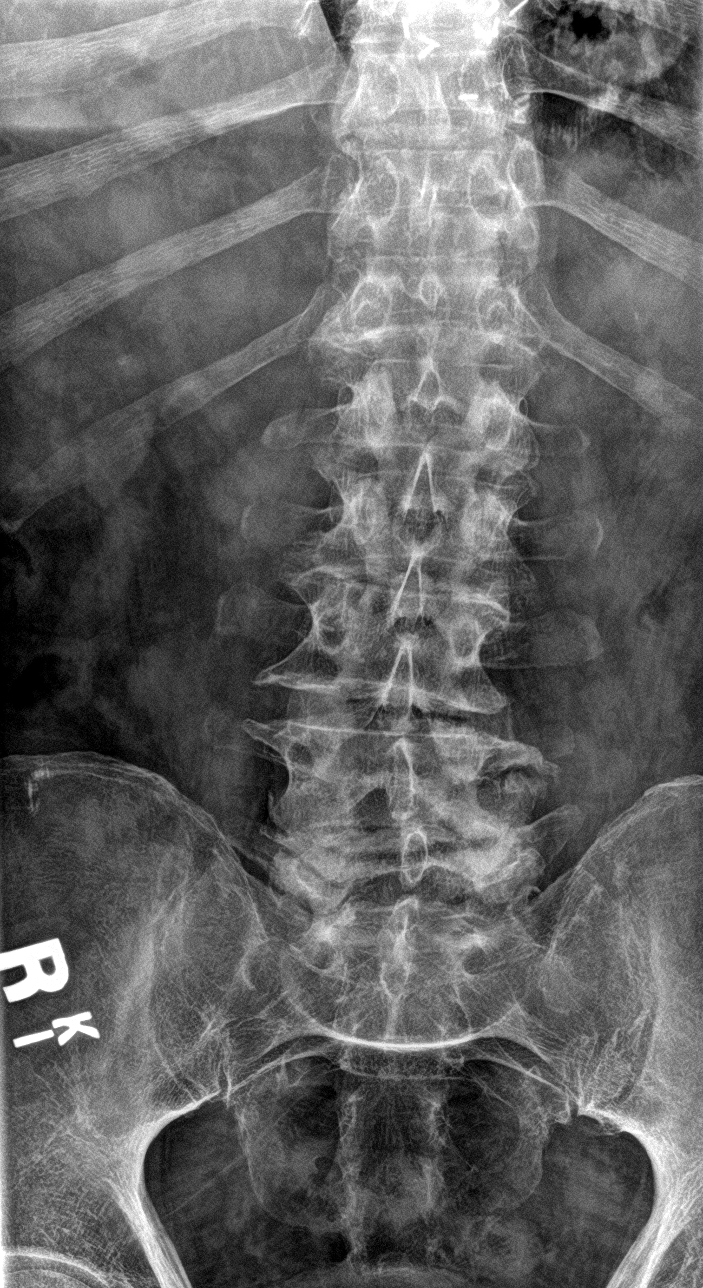

[l-spine obl (1 of 2)]
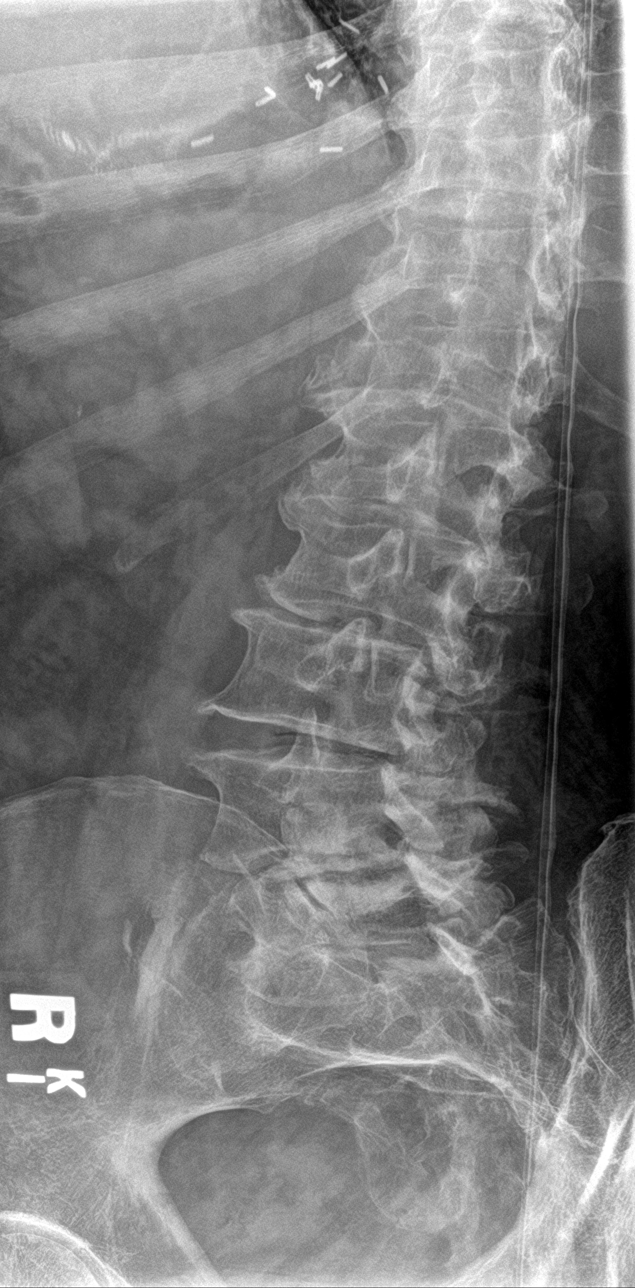

[l-spine obl (2 of 2)]
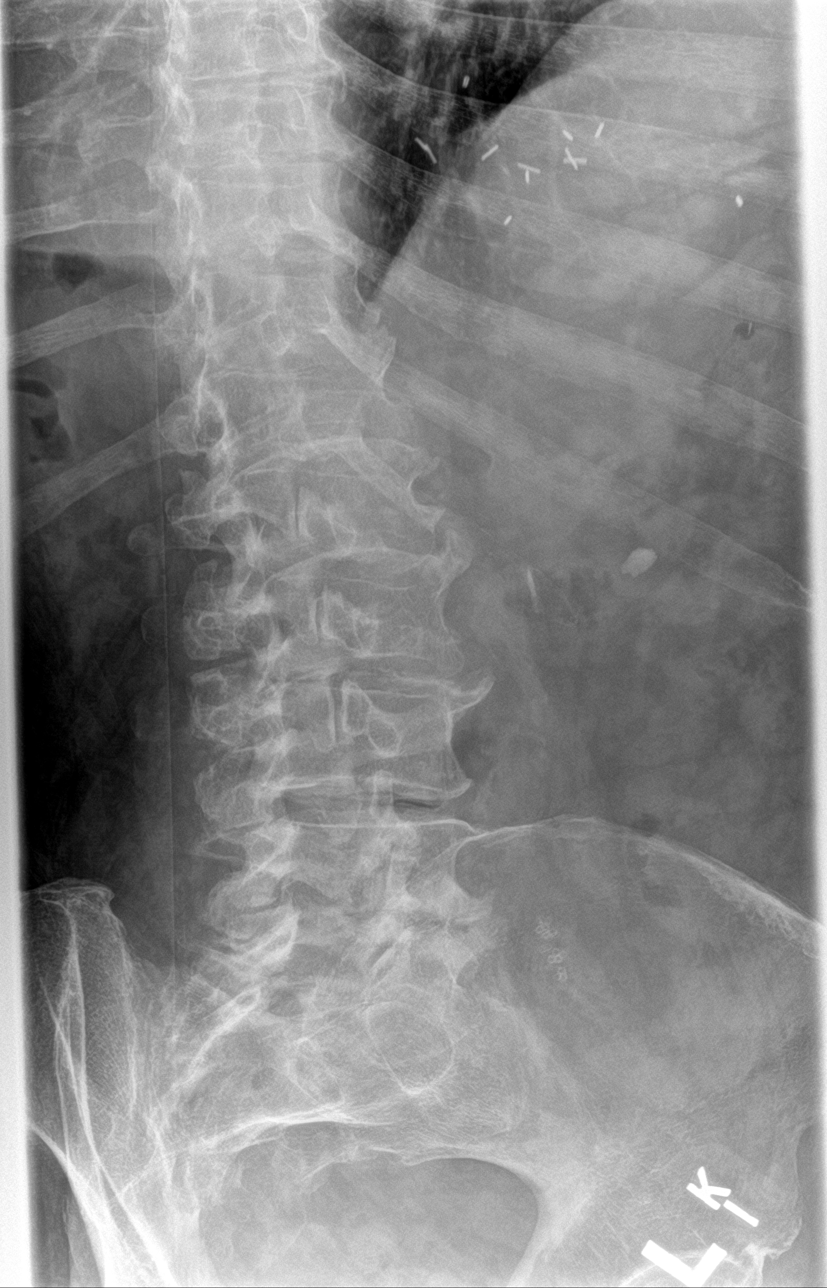

[l-spine lat]
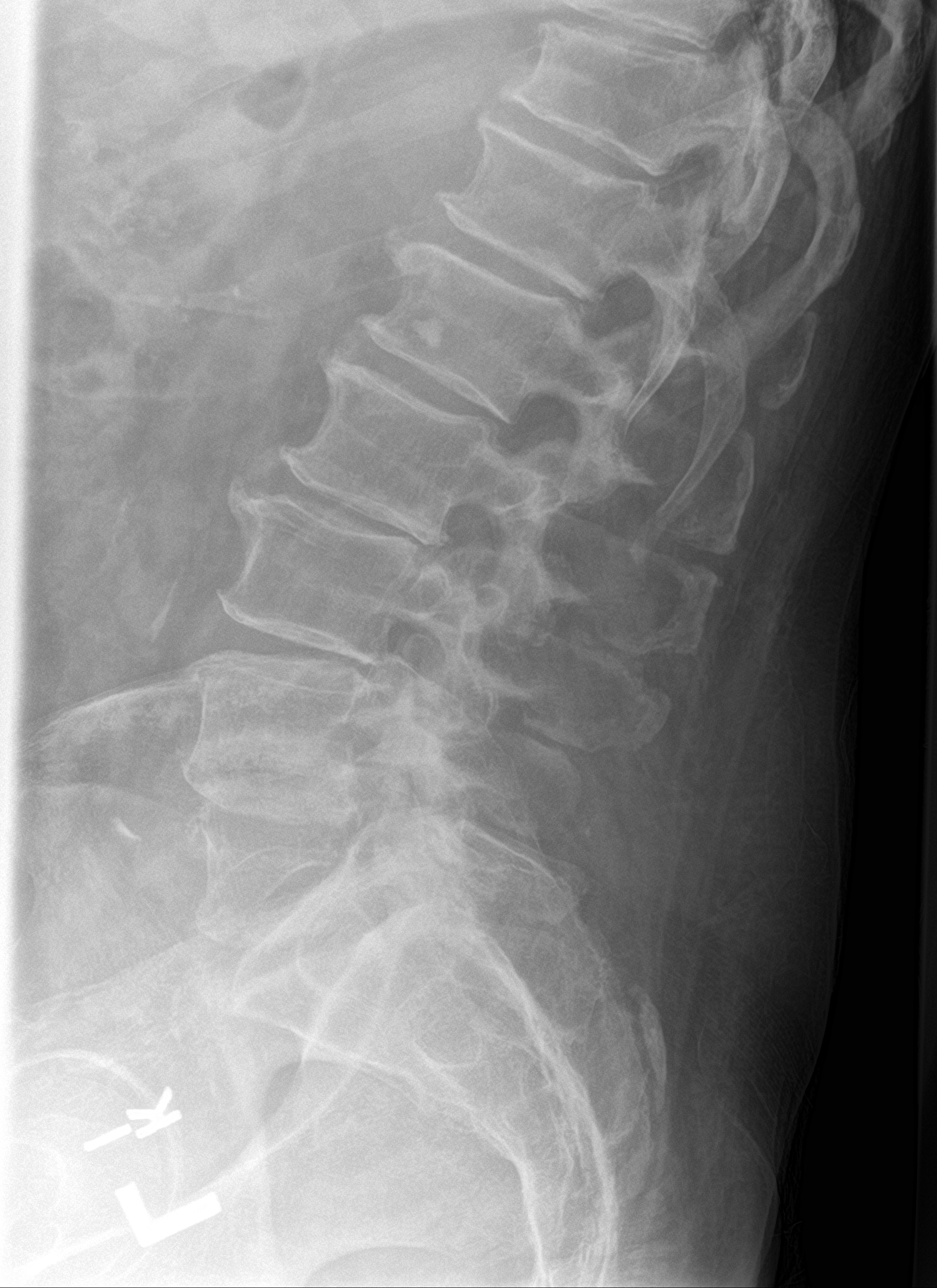

[l-spine spot]
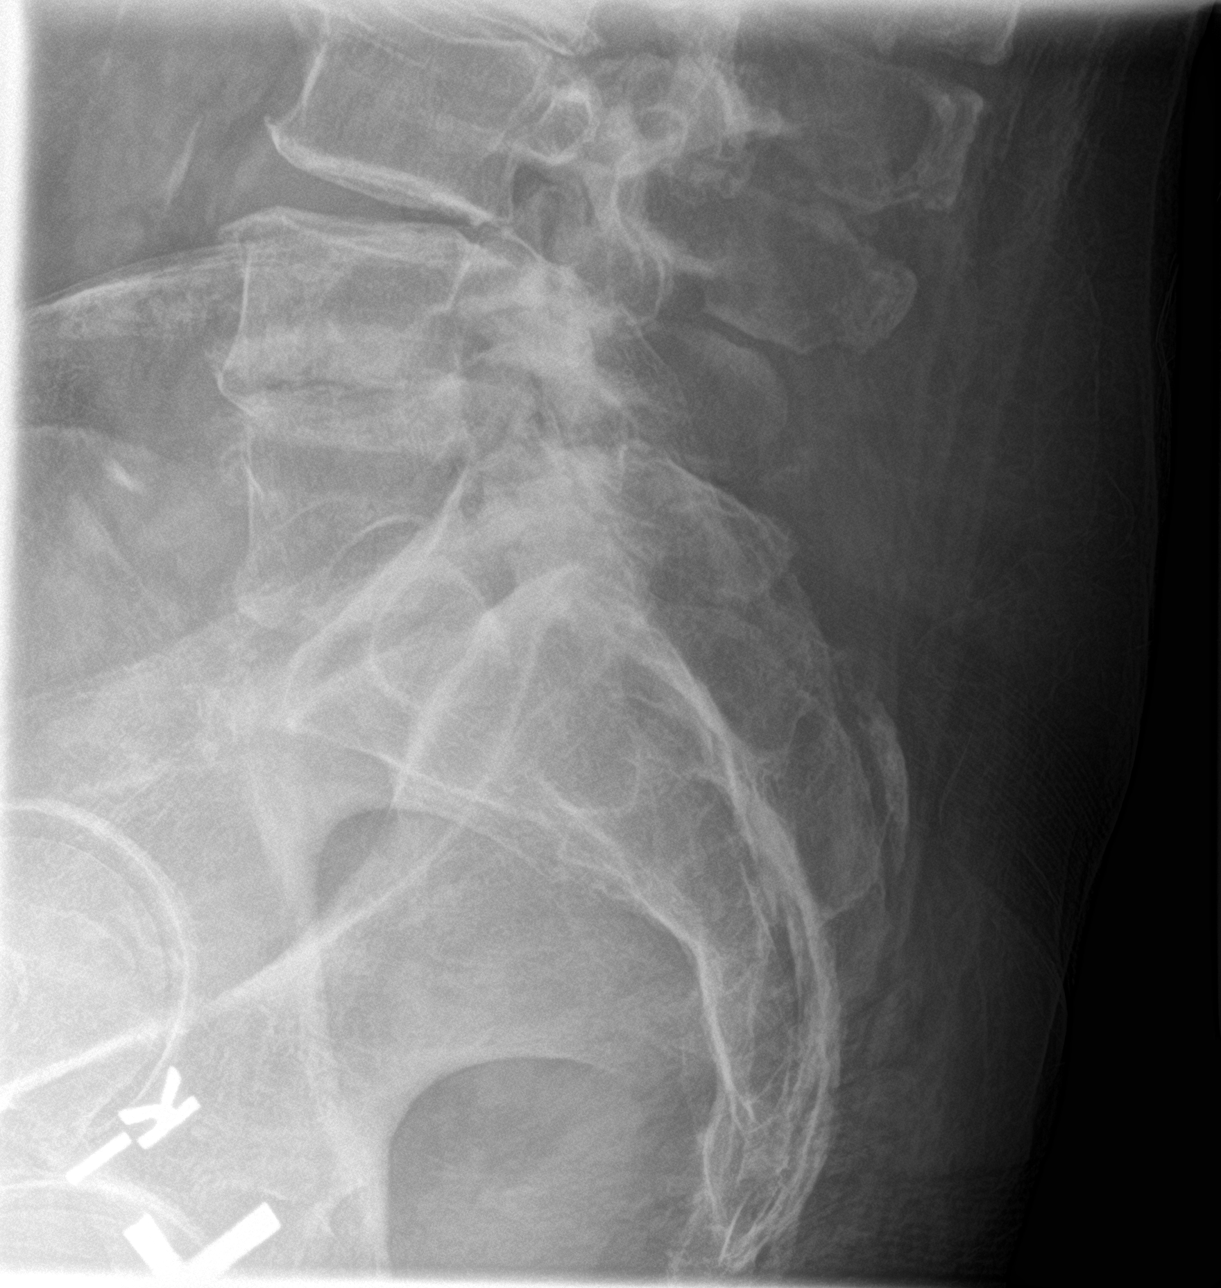

[5 of 5 positions shown; findings below may reference images not displayed]

FINDINGS: Lumbar spine numbered as per prior MRI. Mild thoracolumbar spine
scoliosis. Diffuse multilevel severe degenerative change. Stable
mild anterolisthesis L4 on L5. Stable mild T11 compression fracture.
No acute bony abnormality. Prominent calcific density noted over the
left kidney consistent nephrolithiasis. Aortoiliac atherosclerotic
vascular calcification.
IMPRESSION: 1. Mild thoracolumbar spine scoliosis. Diffuse multilevel severe
degenerative change. Stable mild anterolisthesis L4 on L5. Stable
mild T11 compression fracture. No acute bony abnormality.

2. Prominent calcific density noted over the left kidney consistent
with nephrolithiasis.

3.  Aortoiliac atherosclerotic vascular disease.

## 2019-01-10 MED ORDER — GABAPENTIN 100 MG PO CAPS: 200.0000 mg | ORAL_CAPSULE | Freq: Every day | ORAL | 0 refills | Status: DC

## 2019-01-10 NOTE — Patient Instructions (Signed)
Xrays downstairs Gabapentin 200mg  at night Thurmond Butts will call you for custom Good to see you.  Ice 20 minutes 2 times daily. Usually after activity and before bed. Exercises 3 times a week. Turmeric 500mg  daily  Tart cherry extract 1200mg  at night Vitamin D 2000 IU daily  See me again in 4-5 weeks

## 2019-01-10 NOTE — Assessment & Plan Note (Signed)
Left knee does have some arthritis.  Abnormal thigh to calf ratio with medial compartment arthritis.  Patient will need a medial unloader brace and due to the abnormality of the thigh to calf ratio and instability patient will need a custom brace.  Discussed icing regimen, over-the-counter topical anti-inflammatories, x-rays ordered to further evaluate.  Follow-up with me again in 4 to 8 weeks

## 2019-01-10 NOTE — Assessment & Plan Note (Signed)
Patient does have some weakness of the lower extremity on the left side.  Patient does have left foot drop.  Seems to be worsening.  Discussed with patient about gabapentin and given a low dose.  Warned of potential side effects.  Discussed icing regimen again.  Follow-up again with me in 4 to 8 weeks and consider changing be warranted.

## 2019-02-07 ENCOUNTER — Other Ambulatory Visit: Payer: Self-pay

## 2019-02-07 ENCOUNTER — Ambulatory Visit (INDEPENDENT_AMBULATORY_CARE_PROVIDER_SITE_OTHER): Payer: Medicare Other | Admitting: Family Medicine

## 2019-02-07 ENCOUNTER — Encounter: Payer: Self-pay | Admitting: Family Medicine

## 2019-02-07 VITALS — BP 138/90 | HR 119 | Ht 70.0 in | Wt 194.0 lb

## 2019-02-07 DIAGNOSIS — M25562 Pain in left knee: Secondary | ICD-10-CM | POA: Diagnosis not present

## 2019-02-07 DIAGNOSIS — M5416 Radiculopathy, lumbar region: Secondary | ICD-10-CM

## 2019-02-07 DIAGNOSIS — G8929 Other chronic pain: Secondary | ICD-10-CM | POA: Diagnosis not present

## 2019-02-07 DIAGNOSIS — M21372 Foot drop, left foot: Secondary | ICD-10-CM | POA: Diagnosis not present

## 2019-02-07 NOTE — Progress Notes (Signed)
[name] [place], [place] [address] Phone: [phone] Subjective:   [name], am serving as a scribe for Dr. Hulan Saas.    CC: Knee and back pain  [id number]   01/10/2019 Patient does have some weakness of the lower extremity on the left side.  Patient does have left foot drop.  Seems to be worsening.  Discussed with patient about gabapentin and given a low dose.  Warned of potential side effects.  Discussed icing regimen again.  Follow-up again with me in 4 to 8 weeks and consider changing be warranted.  Left knee does have some arthritis.  Abnormal thigh to calf ratio with medial compartment arthritis.  Patient will need a medial unloader brace and due to the abnormality of the thigh to calf ratio and instability patient will need a custom brace.  Discussed icing regimen, over-the-counter topical anti-inflammatories, x-rays ordered to further evaluate.  Follow-up with me again in 4 to 8 weeks  Update 02/07/2019 Tyrone [name] is a 73 y.o. male coming in with complaint of back and knee pain. Patient states that he has been using the hinged knee brace. Is using gabapentin at night. Pain is less in the left knee. Back pain continues on left side. Denies any radiating symptoms.  Patient still has some instability of the knee though noted.     Past Medical History:  Diagnosis Date  . Anemia   . Arthritis    hands  . Blood transfusion without reported diagnosis    for stomach ulcers  . Depression   . Hypertension   . Kidney stone   . Multiple duodenal ulcers    Past Surgical History:  Procedure Laterality Date  . ABDOMINAL SURGERY     ulcers  . COLONOSCOPY    . LITHOTRIPSY     Social History   Socioeconomic History  . Marital status: Married    Spouse name: Not on file  . Number of children: Not on file  . Years of education: Not on file  . Highest education level: Not on file  Occupational History  . Not on  file  Social Needs  . Financial resource strain: Not on file  . Food insecurity    Worry: Not on file    Inability: Not on file  . Transportation needs    Medical: Not on file    Non-medical: Not on file  Tobacco Use  . Smoking status: Former Smoker    Quit date: 05/30/1980    Years since quitting: 38.7  . Smokeless tobacco: Former Systems developer    Types: [place] date: 05/30/2007  Substance and Sexual Activity  . Alcohol use: Yes    Alcohol/week: 0.0 standard drinks    Comment: rarely per pt/heavy per wife  . Drug use: No  . Sexual activity: Not on file  Lifestyle  . Physical activity    Days per week: Not on file    Minutes per session: Not on file  . Stress: Not on file  Relationships  . Social Herbalist on phone: Not on file    Gets together: Not on file    Attends religious service: Not on file    Active member of club or organization: Not on file    Attends meetings of clubs or organizations: Not on file    Relationship status: Not on file  Other Topics Concern  . Not on file  Social  History Narrative  . Not on file   No Known Allergies Family History  Problem Relation Age of Onset  . Colon cancer Neg Hx        Current Outpatient Medications (Analgesics):  .  allopurinol (ZYLOPRIM) 300 MG tablet, Take 1 tablet by mouth daily.   Current Outpatient Medications (Other):  .  cyclobenzaprine (FLEXERIL) 10 MG tablet, Take 1 tablet (10 mg total) by mouth 3 (three) times daily as needed for muscle spasms. Marland Kitchen  gabapentin (NEURONTIN) 100 MG capsule, Take 2 capsules (200 mg total) by mouth at bedtime. .  pantoprazole (PROTONIX) 40 MG tablet, Take 1 tablet (40 mg total) by mouth 2 (two) times daily.    Past medical history, social, surgical and family history all reviewed in electronic medical record.  No pertanent information unless stated regarding to the chief complaint.   Review of Systems:  No headache, visual changes, nausea, vomiting, diarrhea,  constipation, dizziness, abdominal pain, skin rash, fevers, chills, night sweats, weight loss, swollen lymph nodes, body aches, joint swelling, chest pain, shortness of breath, mood changes.  Positive muscle aches  Objective  Blood pressure 138/90, pulse (!) 119, height 5\' 10"  (1.778 m), weight 194 lb (88 kg), SpO2 97 %.    General: No apparent distress alert and oriented x3 mood and affect normal, dressed appropriately.  HEENT: Pupils equal, extraocular movements intact  Respiratory: Patient's speak in full sentences and does not appear short of breath  Cardiovascular: No lower extremity edema, non tender, no erythema  Skin: Warm dry intact with no signs of infection or rash on extremities or on axial skeleton.  Abdomen: Soft nontender  Neuro: Cranial nerves II through XII are intact, neurovascularly intact in all extremities with 2+ DTRs and 2+ pulses.  Lymph: No lymphadenopathy of posterior or anterior cervical chain or axillae bilaterally.  Gait antalgic MSK:  tender with full range of motion and good stability and symmetric strength and tone of shoulders, elbows, wrist, hip and ankles bilaterally.  Left knee pain still noted.  More pain over the medial and patellofemoral joint still.  Patient does have some instability with valgus force.  Abnormal thigh to calf ratio. Foot exam does show that patient has had some mild improvements with 4 out of 5 strength now with dorsiflexion which is improved from previous.   After informed written and verbal consent, patient was seated on exam table. Left knee was prepped with alcohol swab and utilizing anterolateral approach, patient's left knee space was injected with 4:1  marcaine 0.5%: Kenalog 40mg /dL. Patient tolerated the procedure well without immediate complications.   Impression and Recommendations:     This case required medical decision making of moderate complexity. The above documentation has been reviewed and is accurate and complete  Lyndal Pulley, DO       Note: This dictation was prepared with Dragon dictation along with smaller phrase technology. Any transcriptional errors that result from this process are unintentional.

## 2019-02-07 NOTE — Assessment & Plan Note (Signed)
Mild weakness noted in stomach.  I do believe that there is a chronic left lumbar radiculopathy with some weakness that could be contributing.  Discussed with patient in great length, discussed home exercises, patient wants to continue with conservative therapy otherwise.  Follow-up with me again 4 to 6 weeks

## 2019-02-07 NOTE — Patient Instructions (Signed)
See me again in 5-6 weeks 

## 2019-02-07 NOTE — Assessment & Plan Note (Signed)
Attempted injection into the left pain.  I do believe that there is a chronic left lumbar radiculopathy with left foot drop likely contributing to some of the weakness.  No side effects to the gabapentin patient did not want to increase.  We discussed icing regimen.  Topical anti-inflammatories.  History of gout as well.  See how patient responds to the injection and consider viscosupplementation.  Follow-up again in 4 to 6 weeks

## 2019-02-14 ENCOUNTER — Other Ambulatory Visit: Payer: Self-pay | Admitting: Family Medicine

## 2019-02-16 ENCOUNTER — Other Ambulatory Visit: Payer: Self-pay | Admitting: Family Medicine

## 2019-03-12 NOTE — Progress Notes (Signed)
[facility] [place], [place] [address] Phone: [phone] Subjective:   I Tyrone [name] am serving as a Education administrator for Dr. Hulan Saas.  I'm seeing this patient by the request  of:    CC: Low back and knee pain follow-up  [id number]   02/07/2019 Attempted injection into the left pain.  I do believe that there is a chronic left lumbar radiculopathy with left foot drop likely contributing to some of the weakness.  No side effects to the gabapentin patient did not want to increase.  We discussed icing regimen.  Topical anti-inflammatories.  History of gout as well.  See how patient responds to the injection and consider viscosupplementation.  Follow-up again in 4 to 6 weeks  03/14/2019 Tyrone [name] is a 73 y.o. male coming in with complaint of left knee pain. Patient state he feels he is getting better. Last week he fell twice. Patient has scabs around the left eye.  Patient states that he has been continuing to have some instability of the leg.  Patient denies any numbness or tingling.  States that the weakness in the foot seems to be improving      Past Medical History:  Diagnosis Date  . Anemia   . Arthritis    hands  . Blood transfusion without reported diagnosis    for stomach ulcers  . Depression   . Hypertension   . Kidney stone   . Multiple duodenal ulcers    Past Surgical History:  Procedure Laterality Date  . ABDOMINAL SURGERY     ulcers  . COLONOSCOPY    . LITHOTRIPSY     Social History   Socioeconomic History  . Marital status: Married    Spouse name: Not on file  . Number of children: Not on file  . Years of education: Not on file  . Highest education level: Not on file  Occupational History  . Not on file  Social Needs  . Financial resource strain: Not on file  . Food insecurity    Worry: Not on file    Inability: Not on file  . Transportation needs    Medical: Not on file    Non-medical: Not on file   Tobacco Use  . Smoking status: Former Smoker    Quit date: 05/30/1980    Years since quitting: 38.8  . Smokeless tobacco: Former Systems developer    Types: [place] date: 05/30/2007  Substance and Sexual Activity  . Alcohol use: Yes    Alcohol/week: 0.0 standard drinks    Comment: rarely per pt/heavy per wife  . Drug use: No  . Sexual activity: Not on file  Lifestyle  . Physical activity    Days per week: Not on file    Minutes per session: Not on file  . Stress: Not on file  Relationships  . Social Herbalist on phone: Not on file    Gets together: Not on file    Attends religious service: Not on file    Active member of club or organization: Not on file    Attends meetings of clubs or organizations: Not on file    Relationship status: Not on file  Other Topics Concern  . Not on file  Social History Narrative  . Not on file   No Known Allergies Family History  Problem Relation Age of Onset  . Colon cancer Neg Hx  Current Outpatient Medications (Analgesics):  .  allopurinol (ZYLOPRIM) 300 MG tablet, Take 1 tablet by mouth daily.   Current Outpatient Medications (Other):  .  cyclobenzaprine (FLEXERIL) 10 MG tablet, Take 1 tablet (10 mg total) by mouth 3 (three) times daily as needed for muscle spasms. Marland Kitchen  gabapentin (NEURONTIN) 100 MG capsule, Take 2 capsules (200 mg total) by mouth at bedtime. .  pantoprazole (PROTONIX) 40 MG tablet, TAKE 1 TABLET BY MOUTH TWICE DAILY    Past medical history, social, surgical and family history all reviewed in electronic medical record.  No pertanent information unless stated regarding to the chief complaint.   Review of Systems:  No headache, visual changes, nausea, vomiting, diarrhea, constipation, dizziness, abdominal pain, skin rash, fevers, chills, night sweats, weight loss, swollen lymph nodes, body aches, joint swelling, muscle aches, chest pain, shortness of breath, mood changes.   Objective  There were no vitals  taken for this visit. Systems examined below as of    General: No apparent distress alert and oriented x3 mood and affect normal, dressed appropriately.  Patient does have scarring around the left arm from scabs HEENT: Pupils equal, extraocular movements intact as stated above Respiratory: Patient's speak in full sentences and does not appear short of breath  Cardiovascular: No lower extremity edema, non tender, no erythema  Skin: Warm dry intact with no signs of infection or rash on extremities or on axial skeleton.  Abdomen: Soft nontender  Neuro: Cranial nerves II through XII are intact, neurovascularly intact in all extremities with 2+ DTRs and 2+ pulses.  Lymph: No lymphadenopathy of posterior or anterior cervical chain or axillae bilaterally.  Gait normal with good balance and coordination.  MSK:  Non tender with full range of motion and good stability and symmetric strength and tone of shoulders, elbows, wrist, hip and ankles bilaterally.  Still some weakness with foot drop noted Knee: Left valgus deformity noted.  Abnormal thigh to calf ratio.  Tender to palpation over medial and PF joint line.  ROM full in flexion and extension and lower leg rotation. instability with valgus force.  painful patellar compression. Patellar glide with moderate crepitus. Patellar and quadriceps tendons unremarkable. Hamstring and quadriceps strength is normal. Contralateral knee has arthritic changes but no tenderness and no significant instability noted at the moment.  Significant arthritic changes are noted  After informed written and verbal consent, patient was seated on exam table. Left knee was prepped with alcohol swab and utilizing anterolateral approach, patient's left knee space was injected with 22 mg/mL of Monovisc (sodium hyaluronate) in a prefilled syringe was injected easily into the knee through a 22-gauge needle..Patient tolerated the procedure well without immediate complications.     Impression and Recommendations:     This case required medical decision making of moderate complexity. The above documentation has been reviewed and is accurate and complete Lyndal Pulley, DO       Note: This dictation was prepared with Dragon dictation along with smaller phrase technology. Any transcriptional errors that result from this process are unintentional.

## 2019-03-14 ENCOUNTER — Ambulatory Visit (INDEPENDENT_AMBULATORY_CARE_PROVIDER_SITE_OTHER): Payer: Medicare Other | Admitting: Family Medicine

## 2019-03-14 ENCOUNTER — Encounter: Payer: Self-pay | Admitting: Family Medicine

## 2019-03-14 ENCOUNTER — Other Ambulatory Visit: Payer: Self-pay

## 2019-03-14 DIAGNOSIS — M25562 Pain in left knee: Secondary | ICD-10-CM | POA: Diagnosis not present

## 2019-03-14 DIAGNOSIS — G8929 Other chronic pain: Secondary | ICD-10-CM | POA: Diagnosis not present

## 2019-03-14 NOTE — Assessment & Plan Note (Signed)
Arthritic changes and does have some swelling.  Does have a history of gout.  Moderate arthritic changes.  Patient does have instability with thigh to calf ratio discussed which activities to avoid.  Patient will follow-up again in 6 weeks

## 2019-03-14 NOTE — Patient Instructions (Signed)
Xray downstairs  Injection today Follow up with me in 6 weeks

## 2019-04-20 ENCOUNTER — Encounter: Payer: Self-pay | Admitting: Internal Medicine

## 2019-04-26 ENCOUNTER — Ambulatory Visit (INDEPENDENT_AMBULATORY_CARE_PROVIDER_SITE_OTHER): Payer: Medicare Other | Admitting: Family Medicine

## 2019-04-26 ENCOUNTER — Encounter: Payer: Self-pay | Admitting: Family Medicine

## 2019-04-26 ENCOUNTER — Other Ambulatory Visit: Payer: Self-pay

## 2019-04-26 DIAGNOSIS — M25562 Pain in left knee: Secondary | ICD-10-CM

## 2019-04-26 DIAGNOSIS — G8929 Other chronic pain: Secondary | ICD-10-CM

## 2019-04-26 NOTE — Progress Notes (Signed)
[name] [place], [place] [address] Phone: [phone] Subjective:   [name], am serving as a scribe for Dr. Hulan Saas.  This visit occurred during the SARS-CoV-2 public health emergency.  Safety protocols were in place, including screening questions prior to the visit, additional usage of staff PPE, and extensive cleaning of exam room while observing appropriate contact time as indicated for disinfecting solutions. .  I'm seeing this patient by the request  of:    This visit occurred during the SARS-CoV-2 public health emergency.  Safety protocols were in place, including screening questions prior to the visit, additional usage of staff PPE, and extensive cleaning of exam room while observing appropriate contact time as indicated for disinfecting solutions.     CC: Knee pain follow-up  [id number]   03/14/2019 Arthritic changes and does have some swelling.  Does have a history of gout.  Moderate arthritic changes.  Patient does have instability with thigh to calf ratio discussed which activities to avoid.  Patient will follow-up again in 6 weeks  Update 04/26/2019 [name] is a 73 y.o. male coming in with complaint of left knee pain. Pain is better but increases with deep knee bending. Does note some swelling that began 2 days ago. Does wear a custom knee brace for increased stability.  Patient states feeling relatively well overall.  Not any significant amount of pain.  Swelling has been very intermittent.  Brace has been significantly helpful      Past Medical History:  Diagnosis Date   Anemia    Arthritis    hands   Blood transfusion without reported diagnosis    for stomach ulcers   Depression    Hypertension    Kidney stone    Multiple duodenal ulcers    Past Surgical History:  Procedure Laterality Date   ABDOMINAL SURGERY     ulcers   COLONOSCOPY     LITHOTRIPSY     Social History    Socioeconomic History   Marital status: Married    Spouse name: Not on file   Number of children: Not on file   Years of education: Not on file   Highest education level: Not on file  Occupational History   Not on file  Social Needs   Financial resource strain: Not on file   Food insecurity    Worry: Not on file    Inability: Not on file   Transportation needs    Medical: Not on file    Non-medical: Not on file  Tobacco Use   Smoking status: Former Smoker    Quit date: 05/30/1980    Years since quitting: 38.9   Smokeless tobacco: Former Systems developer    Types: Chew    Quit date: 05/30/2007  Substance and Sexual Activity   Alcohol use: Yes    Alcohol/week: 0.0 standard drinks    Comment: rarely per pt/heavy per wife   Drug use: No   Sexual activity: Not on file  Lifestyle   Physical activity    Days per week: Not on file    Minutes per session: Not on file   Stress: Not on file  Relationships   Social connections    Talks on phone: Not on file    Gets together: Not on file    Attends religious service: Not on file    Active member of club or organization: Not on file    Attends meetings of clubs or  organizations: Not on file    Relationship status: Not on file  Other Topics Concern   Not on file  Social History Narrative   Not on file   No Known Allergies Family History  Problem Relation Age of Onset   Colon cancer Neg Hx        Current Outpatient Medications (Analgesics):    allopurinol (ZYLOPRIM) 300 MG tablet, Take 1 tablet by mouth daily.   Current Outpatient Medications (Other):    cyclobenzaprine (FLEXERIL) 10 MG tablet, Take 1 tablet (10 mg total) by mouth 3 (three) times daily as needed for muscle spasms.   gabapentin (NEURONTIN) 100 MG capsule, Take 2 capsules (200 mg total) by mouth at bedtime.   pantoprazole (PROTONIX) 40 MG tablet, TAKE 1 TABLET BY MOUTH TWICE DAILY    Past medical history, social, surgical and family  history all reviewed in electronic medical record.  No pertanent information unless stated regarding to the chief complaint.   Review of Systems:  No headache, visual changes, nausea, vomiting, diarrhea, constipation, dizziness, abdominal pain, skin rash, fevers, chills, night sweats, weight loss, swollen lymph nodes, body aches, joint swelling, s, chest pain, shortness of breath, mood changes.  Positive muscle aches  Objective  Blood pressure (!) 150/110, pulse (!) 111, height 5\' 10"  (1.778 m), weight 191 lb (86.6 kg), SpO2 98 %.    General: No apparent distress alert and oriented x3 mood and affect normal, dressed appropriately.  HEENT: Pupils equal, extraocular movements intact  Respiratory: Patient's speak in full sentences and does not appear short of breath  Cardiovascular: No lower extremity edema, non tender, no erythema  Skin: Warm dry intact with no signs of infection or rash on extremities or on axial skeleton.  Abdomen: Soft nontender  Neuro: Cranial nerves II through XII are intact, neurovascularly intact in all extremities with 2+ DTRs and 2+ pulses.  Lymph: No lymphadenopathy of posterior or anterior cervical chain or axillae bilaterally.  Gait mild antalgic MSK:  tender with full range of motion and good stability and symmetric strength and tone of shoulders, elbows, wrist, hip, and ankles bilaterally.  Left knee exam does have varus deformity noted.  Trace effusion noted of the patellofemoral joint.  Patient does have some instability with valgus and varus force.  Abnormal thigh to calf ratio noted.  Crepitus noted with range of motion.    Impression and Recommendations:     y. The above documentation has been reviewed and is accurate and complete Lyndal Pulley, DO       Note: This dictation was prepared with Dragon dictation along with smaller phrase technology. Any transcriptional errors that result from this process are unintentional.

## 2019-04-26 NOTE — Assessment & Plan Note (Signed)
Patient is doing significantly better at this time.  We discussed the possibility of viscosupplementation.  Patient wants to hold on any type of injection at the moment.  Discussed icing regimen, topical anti-inflammatories, follow-up with me again in 4 to 6 weeks if doing well patient can follow-up as needed

## 2019-04-26 NOTE — Patient Instructions (Addendum)
  [address], [address] [place], [place] [address] Phone [phone]  Keep it up See me again in 5 weeks

## 2019-06-03 ENCOUNTER — Other Ambulatory Visit: Payer: Self-pay

## 2019-06-06 ENCOUNTER — Encounter: Payer: Self-pay | Admitting: Family Medicine

## 2019-06-06 ENCOUNTER — Ambulatory Visit (INDEPENDENT_AMBULATORY_CARE_PROVIDER_SITE_OTHER): Payer: Medicare Other | Admitting: Family Medicine

## 2019-06-06 ENCOUNTER — Other Ambulatory Visit: Payer: Self-pay

## 2019-06-06 VITALS — BP 173/86 | HR 94 | Temp 97.6°F | Ht 70.0 in | Wt 188.2 lb

## 2019-06-06 DIAGNOSIS — M109 Gout, unspecified: Secondary | ICD-10-CM | POA: Diagnosis not present

## 2019-06-06 DIAGNOSIS — R7989 Other specified abnormal findings of blood chemistry: Secondary | ICD-10-CM | POA: Diagnosis not present

## 2019-06-06 DIAGNOSIS — E0590 Thyrotoxicosis, unspecified without thyrotoxic crisis or storm: Secondary | ICD-10-CM | POA: Diagnosis not present

## 2019-06-06 DIAGNOSIS — K219 Gastro-esophageal reflux disease without esophagitis: Secondary | ICD-10-CM | POA: Diagnosis not present

## 2019-06-06 DIAGNOSIS — R5383 Other fatigue: Secondary | ICD-10-CM | POA: Diagnosis not present

## 2019-06-06 DIAGNOSIS — L989 Disorder of the skin and subcutaneous tissue, unspecified: Secondary | ICD-10-CM | POA: Diagnosis not present

## 2019-06-06 DIAGNOSIS — E441 Mild protein-calorie malnutrition: Secondary | ICD-10-CM | POA: Diagnosis not present

## 2019-06-06 DIAGNOSIS — Z23 Encounter for immunization: Secondary | ICD-10-CM

## 2019-06-06 DIAGNOSIS — E538 Deficiency of other specified B group vitamins: Secondary | ICD-10-CM

## 2019-06-06 LAB — COMPREHENSIVE METABOLIC PANEL
ALT: 111 U/L — ABNORMAL HIGH (ref 0–53)
AST: 141 U/L — ABNORMAL HIGH (ref 0–37)
Albumin: 4.2 g/dL (ref 3.5–5.2)
Alkaline Phosphatase: 86 U/L (ref 39–117)
BUN: 18 mg/dL (ref 6–23)
CO2: 27 mEq/L (ref 19–32)
Calcium: 9.3 mg/dL (ref 8.4–10.5)
Chloride: 95 mEq/L — ABNORMAL LOW (ref 96–112)
Creatinine, Ser: 1.15 mg/dL (ref 0.40–1.50)
GFR: 62.2 mL/min (ref >60.00)
Glucose, Bld: 132 mg/dL — ABNORMAL HIGH (ref 70–99)
Potassium: 3.8 mEq/L (ref 3.5–5.1)
Sodium: 138 mEq/L (ref 135–145)
Total Bilirubin: 1.5 mg/dL — ABNORMAL HIGH (ref 0.2–1.2)
Total Protein: 7 g/dL (ref 6.0–8.3)

## 2019-06-06 LAB — T4, FREE: Free T4: 1.46 ng/dL (ref 0.60–1.60)

## 2019-06-06 LAB — VITAMIN B12: Vitamin B-12: >1500 pg/mL — ABNORMAL HIGH (ref 211–911)

## 2019-06-06 LAB — CBC
HCT: 38.7 % — ABNORMAL LOW (ref 39.0–52.0)
Hemoglobin: 12.5 g/dL — ABNORMAL LOW (ref 13.0–17.0)
MCHC: 32.4 g/dL (ref 30.0–36.0)
MCV: 100.7 fl — ABNORMAL HIGH (ref 78.0–100.0)
Platelets: 165 10*3/uL (ref 150.0–400.0)
RBC: 3.85 Mil/uL — ABNORMAL LOW (ref 4.22–5.81)
RDW: 15.1 % (ref 11.5–15.5)
WBC: 7.2 10*3/uL (ref 4.0–10.5)

## 2019-06-06 LAB — T3, FREE: T3, Free: 3.1 pg/mL (ref 2.3–4.2)

## 2019-06-06 LAB — TSH: TSH: 0.44 u[IU]/mL (ref 0.35–4.50)

## 2019-06-06 NOTE — Progress Notes (Signed)
   Tyrone [name] is a 74 y.o. male who presents today for an office visit.  Assessment/Plan:  New/Acute Problems: Elevated BP Reading Previously well controlled.  No red flags.  Advised him to continue home monitoring with goal 140/90 or lower.  Skin Lesion Punch biopsy performed today.  Tolerated well.  See below procedure note.  Chronic Problems Addressed Today: GERD (gastroesophageal reflux disease) Continue Protonix 40 mg twice daily.  Gout Stable.  Continue allopurinol 300 mg daily.  Fatigue Likely multifactorial.  Will check labs including CBC, C met, TSH, and B12.  If no abnormalities would consider treating underlying insomnia to see if this improves.   Abnormal TSH Check TSH, free T4, and free T3.  B12 deficiency Check B12 level.  Preventative Healthcare Flu vaccine given today.     Subjective:  HPI:  He has had worsening fatigue over the last 3 months.   Has had trouble falling asleep as well. Appetite has been poor. Lost 5-6 pounds over the past few months. Some night sweats as well. Tried OTC sleep meds which have not helped.   He also has an irritated spot on his right neck that has been there for last couple of weeks.  Has tried using over-the-counter ointment with no improvement.       Objective:  Physical Exam: BP (!) 173/86   Pulse 94   Temp 97.6 F (36.4 C)   Ht '5\' 10"'$  (1.778 m)   Wt 188 lb 4 oz (85.4 kg)   SpO2 98%   BMI 27.01 kg/m   Wt Readings from Last 3 Encounters:  06/06/19 188 lb 4 oz (85.4 kg)  04/26/19 191 lb (86.6 kg)  03/14/19 189 lb (85.7 kg)  Gen: No acute distress, resting comfortably CV: Regular rate and rhythm with no murmurs appreciated Pulm: Normal work of breathing, clear to auscultation bilaterally with no crackles, wheezes, or rhonchi Skin: Approximately 1 cm macerated area on right posterior neck. Neuro: Grossly normal, moves all extremities Psych: Normal affect and thought content  Procedure Note: After  informed verbal consent was obtained, using Betadine for cleansing and 1% Lidocaine with epinephrine for anesthetic, with sterile technique a 3 mm punch biopsy was used to obtain a biopsy specimen of the lesion. Hemostasis was obtained by pressure and wound was not sutured. Antibiotic dressing is applied, and wound care instructions provided. Be alert for any signs of cutaneous infection. The specimen is labeled and sent to pathology for evaluation. The procedure was well tolerated without complications.      Tyrone [name]. [name], MD 06/06/2019 8:47 AM

## 2019-06-06 NOTE — Assessment & Plan Note (Signed)
Likely multifactorial.  Will check labs including CBC, C met, TSH, and B12.  If no abnormalities would consider treating underlying insomnia to see if this improves.

## 2019-06-06 NOTE — Assessment & Plan Note (Signed)
Check B12 level. 

## 2019-06-06 NOTE — Assessment & Plan Note (Signed)
Check TSH, free T4, and free T3.

## 2019-06-06 NOTE — Assessment & Plan Note (Signed)
Continue Protonix 40 mg twice daily. ?

## 2019-06-06 NOTE — Assessment & Plan Note (Signed)
Stable.  Continue allopurinol 300 mg daily.

## 2019-06-06 NOTE — Patient Instructions (Signed)
It was very nice to see you today!  Please keep an eye on your blood pressure and let me know if persistently 140/90 or higher.  We will check blood work today.  We also performed a biopsy today.  This should come back later this week.  We will call you with results once they are available.  Take care, Dr [name]  Please try these tips to maintain a healthy lifestyle:   Eat at least 3 REAL meals and 1-2 snacks per day.  Aim for no more than 5 hours between eating.  If you eat breakfast, please do so within one hour of getting up.    Each meal should contain half fruits/vegetables, one quarter protein, and one quarter carbs (no bigger than a computer mouse)   Cut down on sweet beverages. This includes juice, soda, and sweet tea.     Drink at least 1 glass of water with each meal and aim for at least 8 glasses per day   Exercise at least 150 minutes every week.

## 2019-06-08 ENCOUNTER — Telehealth: Payer: Self-pay | Admitting: Family Medicine

## 2019-06-08 NOTE — Telephone Encounter (Signed)
Notified Tyrone [name] of area of patient lower neck right side

## 2019-06-08 NOTE — Telephone Encounter (Signed)
Skin biopsy was removed from patient but the pathologist is needing to know where it was removed from and would like a call.

## 2019-06-13 ENCOUNTER — Other Ambulatory Visit: Payer: Self-pay

## 2019-06-13 DIAGNOSIS — R748 Abnormal levels of other serum enzymes: Secondary | ICD-10-CM

## 2019-06-13 NOTE — Progress Notes (Signed)
Please inform patient of the following:  His liber numbers are significantly elevated - recommend that he come back to recheck. Please place future order for CMET. Blood counts and the rest of his blood work is stable. His biopsy report is benign - do not need to do anything further with this.   Would like for him to make sure he is getting good sleep and let us know if his fatigue is not improving and if he would like to discuss medications to help him with sleep.

## 2019-06-14 ENCOUNTER — Other Ambulatory Visit: Payer: Self-pay

## 2019-06-14 MED ORDER — AMLODIPINE BESYLATE 10 MG PO TABS: 10.0000 mg | ORAL_TABLET | Freq: Every day | ORAL | 2 refills | Status: DC

## 2019-07-26 ENCOUNTER — Ambulatory Visit (INDEPENDENT_AMBULATORY_CARE_PROVIDER_SITE_OTHER): Payer: Medicare Other | Admitting: Family Medicine

## 2019-07-26 ENCOUNTER — Other Ambulatory Visit: Payer: Self-pay

## 2019-07-26 VITALS — BP 148/72 | HR 103 | Temp 98.7°F | Ht 70.0 in | Wt 187.2 lb

## 2019-07-26 DIAGNOSIS — R410 Disorientation, unspecified: Secondary | ICD-10-CM | POA: Diagnosis not present

## 2019-07-26 DIAGNOSIS — I10 Essential (primary) hypertension: Secondary | ICD-10-CM | POA: Insufficient documentation

## 2019-07-26 DIAGNOSIS — D539 Nutritional anemia, unspecified: Secondary | ICD-10-CM | POA: Diagnosis not present

## 2019-07-26 DIAGNOSIS — K219 Gastro-esophageal reflux disease without esophagitis: Secondary | ICD-10-CM

## 2019-07-26 DIAGNOSIS — F1010 Alcohol abuse, uncomplicated: Secondary | ICD-10-CM | POA: Diagnosis not present

## 2019-07-26 LAB — COMPREHENSIVE METABOLIC PANEL
ALT: 43 U/L (ref 0–53)
AST: 50 U/L — ABNORMAL HIGH (ref 0–37)
Albumin: 3.9 g/dL (ref 3.5–5.2)
Alkaline Phosphatase: 77 U/L (ref 39–117)
BUN: 13 mg/dL (ref 6–23)
CO2: 26 mEq/L (ref 19–32)
Calcium: 9 mg/dL (ref 8.4–10.5)
Chloride: 102 mEq/L (ref 96–112)
Creatinine, Ser: 1.02 mg/dL (ref 0.40–1.50)
GFR: 71.41 mL/min (ref >60.00)
Glucose, Bld: 70 mg/dL (ref 70–99)
Potassium: 4.6 mEq/L (ref 3.5–5.1)
Sodium: 141 mEq/L (ref 135–145)
Total Bilirubin: 0.6 mg/dL (ref 0.2–1.2)
Total Protein: 6.3 g/dL (ref 6.0–8.3)

## 2019-07-26 LAB — CBC
HCT: 31.1 % — ABNORMAL LOW (ref 39.0–52.0)
Hemoglobin: 10.3 g/dL — ABNORMAL LOW (ref 13.0–17.0)
MCHC: 33.1 g/dL (ref 30.0–36.0)
MCV: 104.2 fl — ABNORMAL HIGH (ref 78.0–100.0)
Platelets: 315 10*3/uL (ref 150.0–400.0)
RBC: 2.98 Mil/uL — ABNORMAL LOW (ref 4.22–5.81)
RDW: 16.9 % — ABNORMAL HIGH (ref 11.5–15.5)
WBC: 5.4 10*3/uL (ref 4.0–10.5)

## 2019-07-26 LAB — AMMONIA: Ammonia: 40 umol/L — ABNORMAL HIGH (ref 11–35)

## 2019-07-26 LAB — TSH: TSH: 0.22 u[IU]/mL — ABNORMAL LOW (ref 0.35–4.50)

## 2019-07-26 LAB — FOLATE: Folate: 4.2 ng/mL — ABNORMAL LOW (ref >5.9)

## 2019-07-26 MED ORDER — AMLODIPINE BESYLATE 10 MG PO TABS: 10.0000 mg | ORAL_TABLET | Freq: Every day | ORAL | 2 refills | Status: DC

## 2019-07-26 NOTE — Assessment & Plan Note (Signed)
Encourage cessation. °

## 2019-07-26 NOTE — Progress Notes (Signed)
   Tyrone [name] is a 74 y.o. male who presents today for an office visit.  Assessment/Plan:  New/Acute Problems: Confusion/weakness Nonfocal exam today though concern for possible metabolic encephalopathy given his asterixis.  He has a very high risk for cirrhosis and hepatic encephalopathy due to extensive alcohol use history.  Will check labs today including CBC, C met, TSH, folate, and ammonia level.  Had elevated LFTs about a month ago.  If blood work is normal will consider MRI versus referral to neurology for memory loss and ataxia.  Loss of Appetite Likely related to the above.  We will continue Protonix 40 mg daily.  May need referral to GI depending on above work-up.  Chronic Problems Addressed Today: Alcohol abuse Encourage cessation.  Essential hypertension Above goal today.  Advised patient to restart his amlodipine 10 mg daily.  We will continue ramipril 10 mg twice daily.     Subjective:  HPI:  Patient last seen about 6 weeks ago for fatigue.  He is here today with his son.  Reports that his symptoms are worsening since our last visit.  Son is very concerned about his memory issues and confusion.  Has also sleeping quite a bit more than normal.  He has also had a few falls and some balance issues as well.  Falls are due to loss of balance.  Denies any syncope or presyncopal episodes.  He has generalized weakness.  He has had no appetite and has not been eating very much.  No dysphagia.  No abdominal pain.  No reported constipation or diarrhea.  No vision changes.  No fevers or chills.  No focal weakness.  No numbness.  No recent medication changes.  Per patient and his son he has been drinking quite a bit of alcohol recently though has been trying to cut back within the last week or so.  Patient currently lives by himself at home and his son thinks that he may be depressed.       Objective:  Physical Exam: BP (!) 148/72   Pulse (!) 103   Temp 98.7 F (37.1 C)    Ht _0  (1.778 m)   Wt 187 lb 3.2 oz (84.9 kg)   SpO2 96%   BMI 26.86 kg/m   Wt Readings from Last 3 Encounters:  07/26/19 187 lb 3.2 oz (84.9 kg)  06/06/19 188 lb 4 oz (85.4 kg)  04/26/19 191 lb (86.6 kg)  Gen: No acute distress, resting comfortably CV: Regular rate and rhythm with no murmurs appreciated Pulm: Normal work of breathing, clear to auscultation bilaterally with no crackles, wheezes, or rhonchi Neuro: Cranial nerves II through XII intact.  Finger-Nose-finger testing intact bilaterally.  Strength 5 out of 5 in upper and lower extremities.  Asterixis noted in bilateral upper extremities with wrist extension.  2 out of 3 delayed word recall. Psych: Normal affect and thought content      Caleb M. [name], MD 07/26/2019 2:28 PM

## 2019-07-26 NOTE — Patient Instructions (Signed)
It was very nice to see you today!  Please cut back on drinking and stop completely if you are able to.  Please start the amlodipine I think as well for your blood pressure.  We will check blood work today.  We may need to get an MRI depending on your blood work.  Take care, Dr [name]  Please try these tips to maintain a healthy lifestyle:   Eat at least 3 REAL meals and 1-2 snacks per day.  Aim for no more than 5 hours between eating.  If you eat breakfast, please do so within one hour of getting up.    Each meal should contain half fruits/vegetables, one quarter protein, and one quarter carbs (no bigger than a computer mouse)   Cut down on sweet beverages. This includes juice, soda, and sweet tea.     Drink at least 1 glass of water with each meal and aim for at least 8 glasses per day   Exercise at least 150 minutes every week.

## 2019-07-26 NOTE — Assessment & Plan Note (Signed)
Above goal today.  Advised patient to restart his amlodipine 10 mg daily.  We will continue ramipril 10 mg twice daily.

## 2019-07-27 ENCOUNTER — Other Ambulatory Visit: Payer: Self-pay | Admitting: Family Medicine

## 2019-07-27 ENCOUNTER — Telehealth: Payer: Self-pay

## 2019-07-27 NOTE — Telephone Encounter (Signed)
[name]( patient daughter in law) calling to follow up on lab results. Best contact number [phone]

## 2019-07-27 NOTE — Progress Notes (Signed)
Please inform patient of the following:  Ammonia level is high - this indicates that his liver is not functioning at full capacity. Recommend we check a liver ultrasound to look for any signs of damage. Please place order for RUQ ultrasound.  His folate is low and he is anemic. I think this could explain a lot of his symptoms. Recommend he start folate supplement of at least 5mg  daily. We can recheck in a few months.  His thyroid level is off - this could explain his symptoms also. Recommend referral to endocrinology.   Would like for him to follow up with me in 4-6 weeks but we will contact him once we get results back on his ultrasound. Would like for him to let us know if symptoms are worsening before his follow up visit.

## 2019-07-28 ENCOUNTER — Other Ambulatory Visit: Payer: Self-pay

## 2019-07-28 ENCOUNTER — Telehealth: Payer: Self-pay | Admitting: Family Medicine

## 2019-07-28 DIAGNOSIS — R7989 Other specified abnormal findings of blood chemistry: Secondary | ICD-10-CM

## 2019-07-28 DIAGNOSIS — R748 Abnormal levels of other serum enzymes: Secondary | ICD-10-CM

## 2019-07-28 NOTE — Telephone Encounter (Signed)
Results Given see lab notes

## 2019-07-28 NOTE — Telephone Encounter (Signed)
If his son Theresia Lo, or daughter in law Toast calls, please transfer to [place].

## 2019-07-31 LAB — METHYLMALONIC ACID, SERUM: Methylmalonic Acid, Quant: 95 nmol/L (ref 87–318)

## 2019-08-01 ENCOUNTER — Other Ambulatory Visit: Payer: Self-pay

## 2019-08-01 ENCOUNTER — Ambulatory Visit (HOSPITAL_COMMUNITY)
Admission: RE | Admit: 2019-08-01 | Discharge: 2019-08-01 | Disposition: A | Discharge status: Home / Self Care | Payer: Medicare Other | Source: Ambulatory Visit | Attending: Family Medicine | Admitting: Family Medicine

## 2019-08-01 DIAGNOSIS — R748 Abnormal levels of other serum enzymes: Secondary | ICD-10-CM | POA: Insufficient documentation

## 2019-08-01 DIAGNOSIS — K7689 Other specified diseases of liver: Secondary | ICD-10-CM | POA: Diagnosis not present

## 2019-08-01 IMAGING — US US ABDOMEN LIMITED
2 series · 14 of 25 positions shown · non-contrast
Comparison: None.

CLINICAL DATA: Elevated liver enzymes.  High ammonia

EXAM:
ULTRASOUND ABDOMEN LIMITED RIGHT UPPER QUADRANT

[Series 1: us abdomen limited · 13 of 41 slices shown (1 of 2)]
[im 1/41]
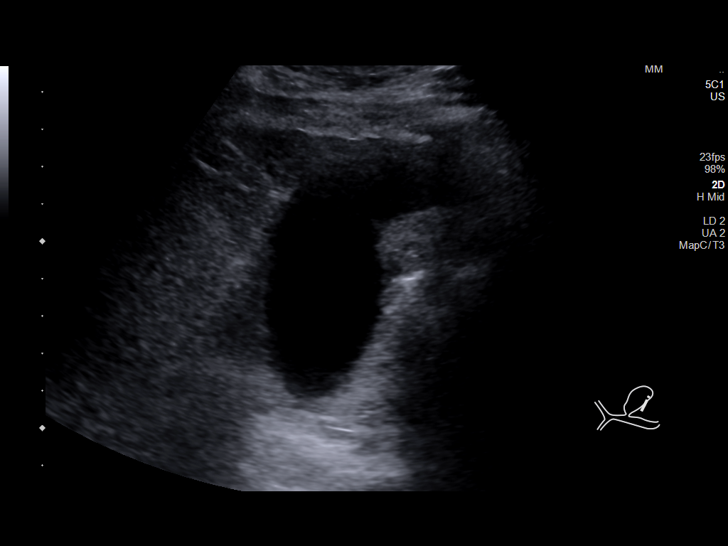
[im 4/41]
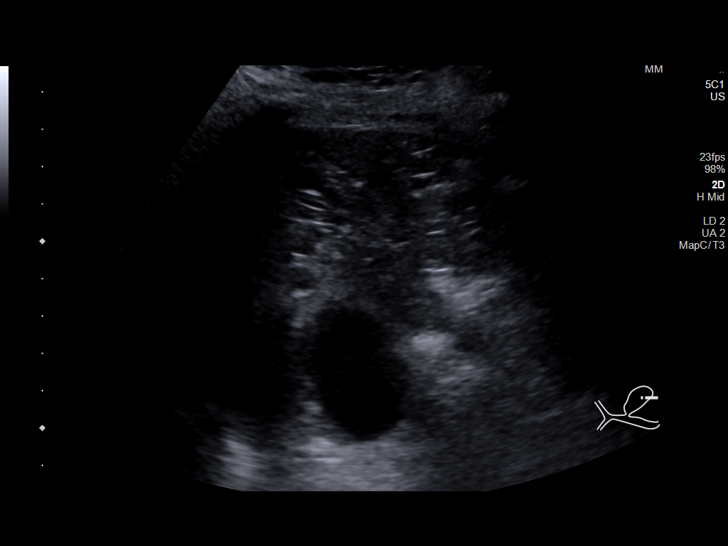
[im 7/41]
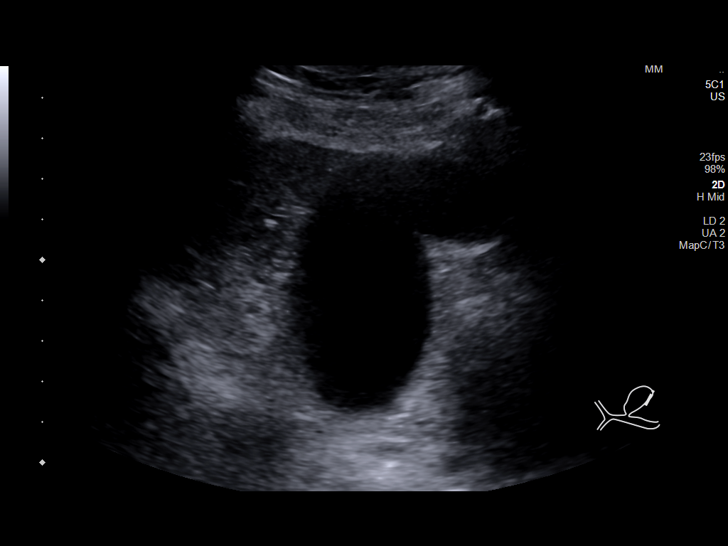
[im 11/41]
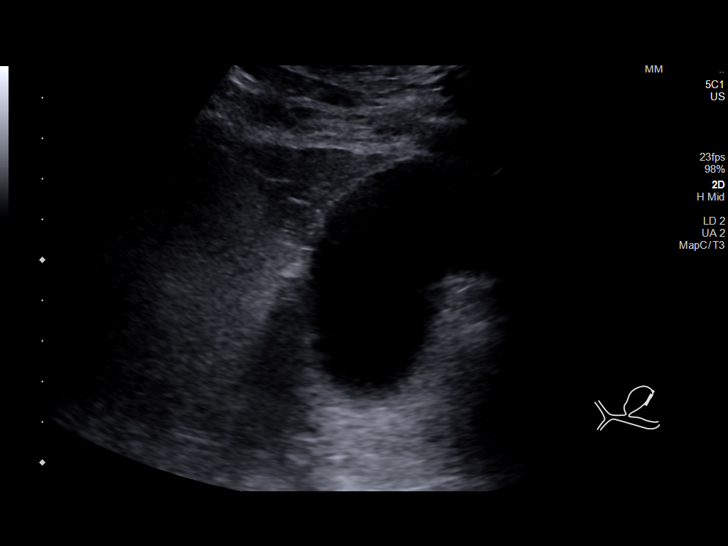
[im 14/41]
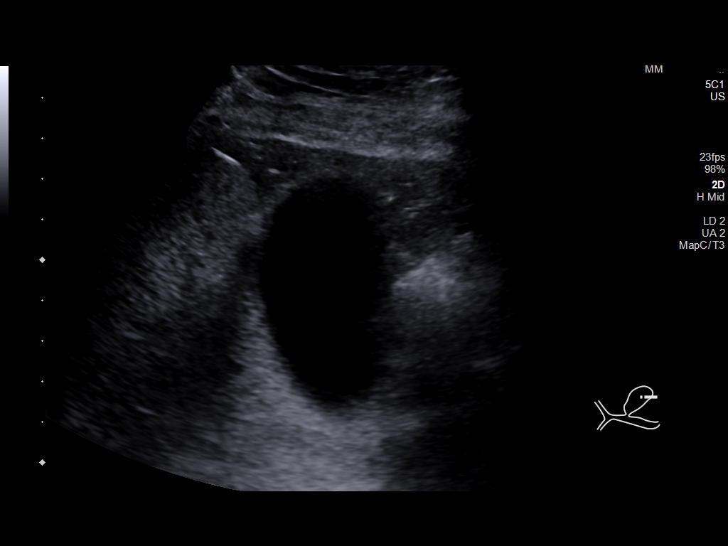
[im 16/41]
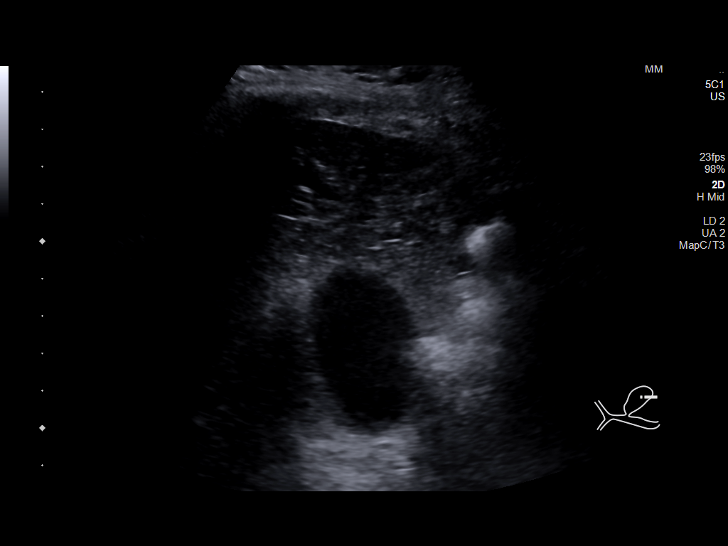
[im 20/41]
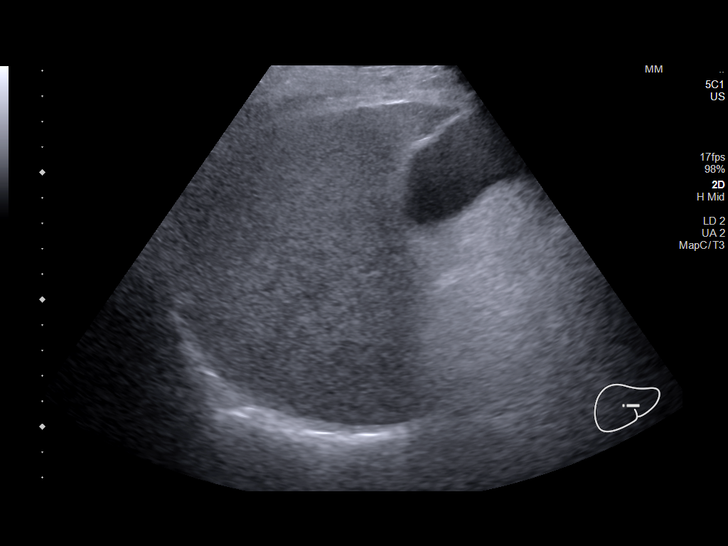
[im 23/41]
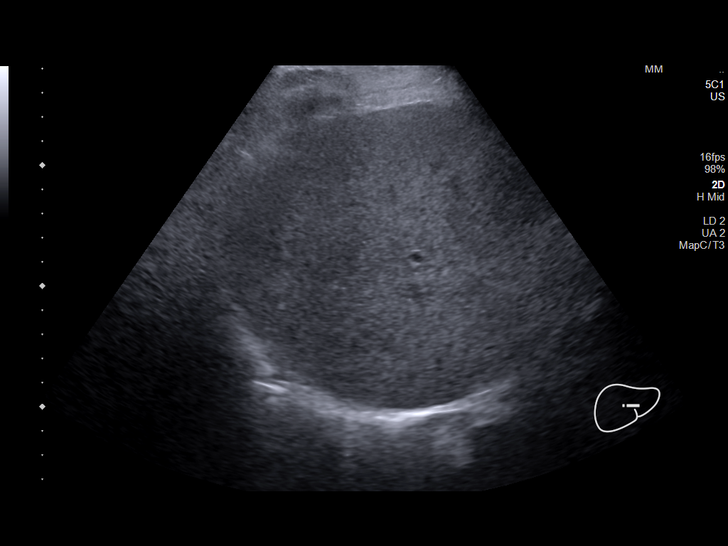
[im 27/41]
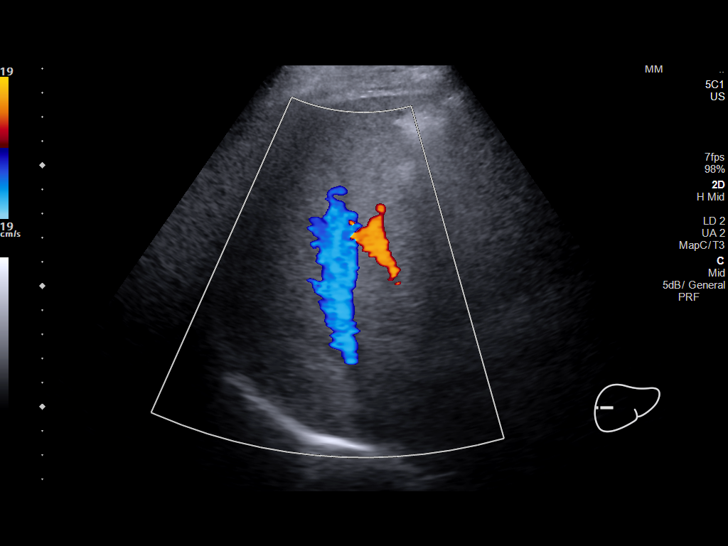
[im 28/41]
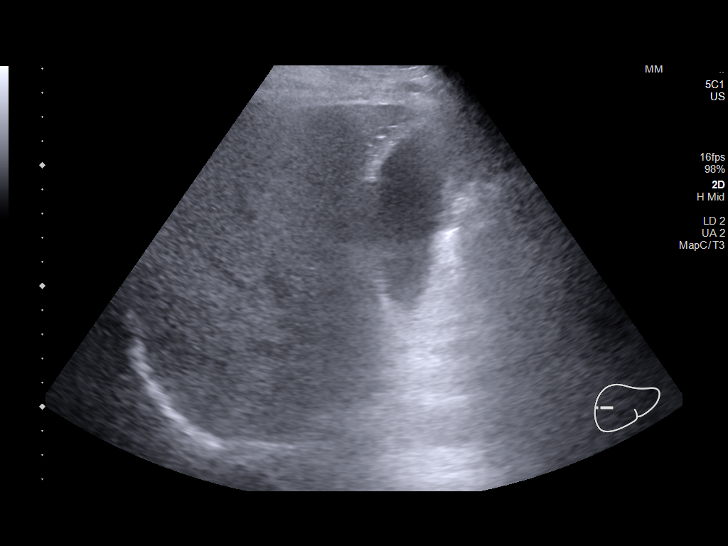
[im 32/41]
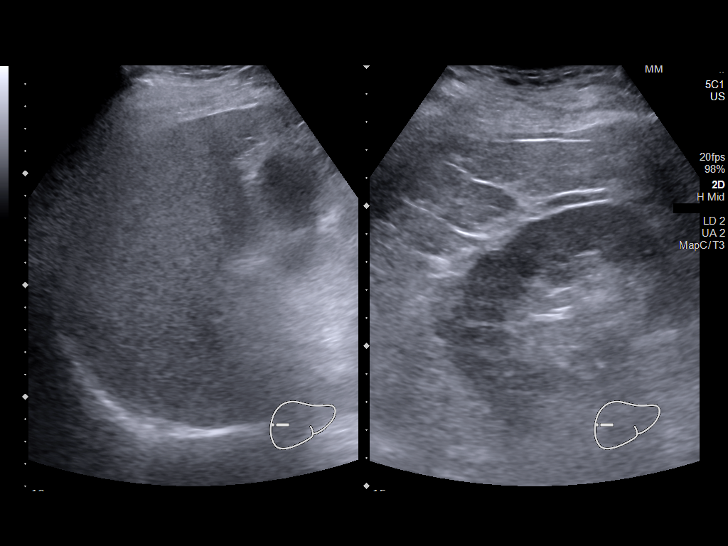
[im 35/41]
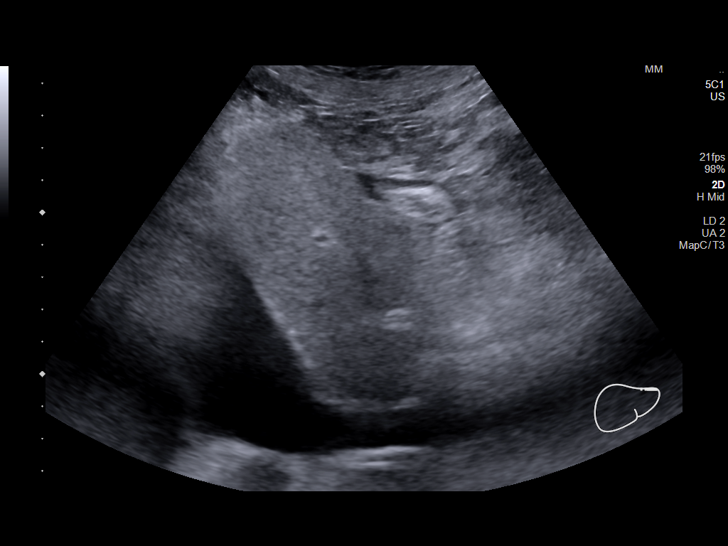
[im 39/41]
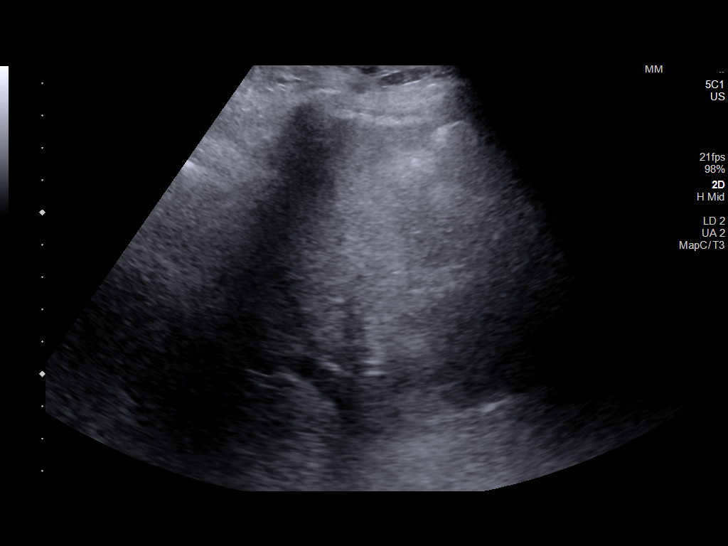

[Series 3: us abdomen limited · 1 of 1 slices shown (2 of 2)]
[im 1/1]
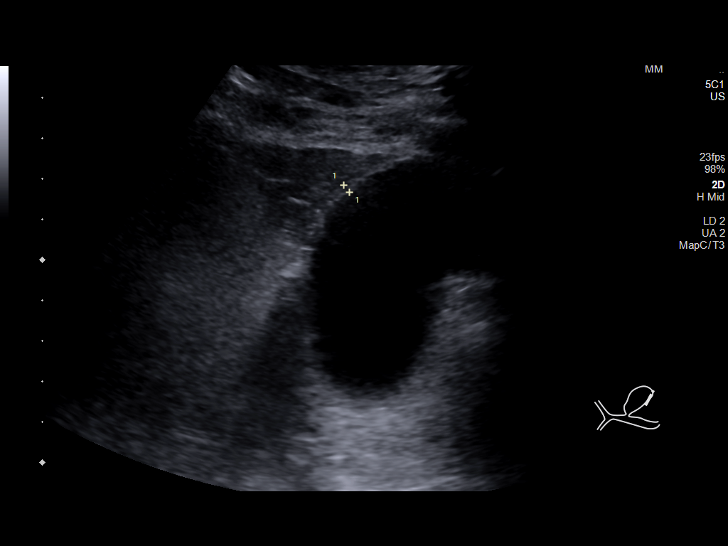

[14 of 25 positions shown; findings below may reference images not displayed]

FINDINGS: Gallbladder:

No gallstones or wall thickening visualized. No sonographic Murphy
sign noted by sonographer.

Common bile duct:

Diameter: Normal at 2 mm.

Liver:

Liver is uniformly increased in echogenicity. No focal lesion.
Ascites. Portal vein is patent on color Doppler imaging with normal
direction of blood flow towards the liver.

Other: None.
IMPRESSION: 1. Uniform increase in hepatic echogenicity suggests hepatic
steatosis versus hepatocellular disease (e.g. Cirrhosis).
2. No biliary duct dilatation.
3. Normal gallbladder.

## 2019-08-01 NOTE — Progress Notes (Signed)
Please inform patient of the following:  Liver shows possible cirrhosis - recommend referral to GI for further evaluation.  Tyrone [name]. [name], MD 08/01/2019 1:03 PM

## 2019-08-02 ENCOUNTER — Other Ambulatory Visit: Payer: Self-pay

## 2019-08-02 DIAGNOSIS — R748 Abnormal levels of other serum enzymes: Secondary | ICD-10-CM

## 2019-08-02 NOTE — Telephone Encounter (Signed)
Spoke with patient son informed him of referrals

## 2019-08-02 NOTE — Telephone Encounter (Signed)
I called them 5 days ago and ended up speaking with Megan.  [facility], did you call them today?

## 2019-08-02 NOTE — Telephone Encounter (Signed)
Patient is returning call and asked to call Megan's number [phone]

## 2019-08-11 DIAGNOSIS — H40033 Anatomical narrow angle, bilateral: Secondary | ICD-10-CM | POA: Diagnosis not present

## 2019-08-11 DIAGNOSIS — H2513 Age-related nuclear cataract, bilateral: Secondary | ICD-10-CM | POA: Diagnosis not present

## 2019-09-09 ENCOUNTER — Other Ambulatory Visit: Payer: Self-pay

## 2019-09-13 ENCOUNTER — Other Ambulatory Visit: Payer: Self-pay

## 2019-09-13 ENCOUNTER — Ambulatory Visit (INDEPENDENT_AMBULATORY_CARE_PROVIDER_SITE_OTHER): Payer: Medicare Other | Admitting: Internal Medicine

## 2019-09-13 ENCOUNTER — Encounter: Payer: Self-pay | Admitting: Internal Medicine

## 2019-09-13 ENCOUNTER — Ambulatory Visit: Payer: Medicare Other | Admitting: Internal Medicine

## 2019-09-13 VITALS — BP 162/88 | HR 95 | Temp 98.1°F | Ht 70.0 in | Wt 195.2 lb

## 2019-09-13 DIAGNOSIS — E0590 Thyrotoxicosis, unspecified without thyrotoxic crisis or storm: Secondary | ICD-10-CM | POA: Diagnosis not present

## 2019-09-13 LAB — T4, FREE: Free T4: 0.86 ng/dL (ref 0.60–1.60)

## 2019-09-13 LAB — TSH: TSH: 0.6 u[IU]/mL (ref 0.35–4.50)

## 2019-09-13 NOTE — Patient Instructions (Addendum)
-   Please have blood work today  - Based on the results ( which may take close to 2 weeks for the graves' disease test ) we may or may not proceed with uptake and scan      Thyroid Uptake and Scan works like this: We would first check a thyroid "scan" (a special, but easy and painless type of thyroid x ray).  you go to the x-ray department of the hospital to swallow a pill, which contains a miniscule amount of radiation.  You will not notice any symptoms from this.  You will go back to the x-ray department the next day, to lie down in front of a camera.  The results of this will be sent to me.

## 2019-09-13 NOTE — Progress Notes (Signed)
Name: Tyrone [name]  MRN/ DOB: [id number], 08-27-1945    Age/ Sex: 74 y.o., male    PCP: Vivi Barrack, MD   Reason for Endocrinology Evaluation: Subclinical hyperthyroidism     Date of Initial Endocrinology Evaluation: 09/14/2019     HPI: Mr. Tyrone [name] is a 74 y.o. male with a past medical history of HTN and GERD. The patient presented for initial endocrinology clinic visit on 09/14/2019 for consultative assistance with his Subclinical hyperthyroidism.   Pt has been noted to have a low TSH since 2019 with a nador if 0.08 uIU/mL in 04/2018 with normal FT4.    He is accompanied today by his daughter in-law Museum/gallery curator)    He did have weight loss last year but has recently gained weight   Denies palpitations, diarrhea , or anxiety   Denies local neck symptoms    Does not take Biotin  No prior exposure   No prior dx of osteoporosis or arrhythmia   No FH of thyroid disease    HISTORY:  Past Medical History:  Past Medical History:  Diagnosis Date  . Anemia   . Arthritis    hands  . Blood transfusion without reported diagnosis    for stomach ulcers  . Depression   . Hypertension   . Kidney stone   . Multiple duodenal ulcers    Past Surgical History:  Past Surgical History:  Procedure Laterality Date  . ABDOMINAL SURGERY     ulcers  . COLONOSCOPY    . LITHOTRIPSY        Social History:  reports that he quit smoking about 39 years ago. He quit smokeless tobacco use about 12 years ago.  His smokeless tobacco use included chew. He reports current alcohol use. He reports that he does not use drugs.  Family History: family history is not on file.   HOME MEDICATIONS: Allergies as of 09/13/2019   No Known Allergies     Medication List       Accurate as of September 13, 2019 11:59 PM. If you have any questions, ask your nurse or doctor.        allopurinol 300 MG tablet Commonly known as: ZYLOPRIM Take 1 tablet by mouth daily.   amLODipine 10 MG  tablet Commonly known as: NORVASC Take 1 tablet (10 mg total) by mouth daily.   B-12 5000 MCG Caps Take by mouth 2 (two) times daily.   citalopram 40 MG tablet Commonly known as: CELEXA Take 40 mg by mouth daily.   pantoprazole 40 MG tablet Commonly known as: PROTONIX TAKE 1 TABLET BY MOUTH TWICE DAILY   ramipril 10 MG capsule Commonly known as: ALTACE Take 10 mg by mouth 2 (two) times daily.         REVIEW OF SYSTEMS: A comprehensive ROS was conducted with the patient and is negative except as per HPI and below:  ROS     OBJECTIVE:  VS: BP (!) 162/88 (BP Location: Left Arm, Patient Position: Sitting, Cuff Size: Normal)   Pulse 95   Temp 98.1 F (36.7 C)   Ht 5\' 10"  (1.778 m)   Wt 195 lb 3.2 oz (88.5 kg)   SpO2 98%   BMI 28.01 kg/m    Wt Readings from Last 3 Encounters:  09/13/19 195 lb 3.2 oz (88.5 kg)  07/26/19 187 lb 3.2 oz (84.9 kg)  06/06/19 188 lb 4 oz (85.4 kg)     EXAM: General: Pt appears well  and is in NAD  Eyes: External eye exam normal without stare, lid lag or exophthalmos.  EOM intact.   Neck: General: Supple without adenopathy. Thyroid: Thyroid size normal.  No goiter or nodules appreciated. No thyroid bruit.  Lungs: Clear with good BS bilat with no rales, rhonchi, or wheezes  Heart: Auscultation: RRR.  Abdomen: Normoactive bowel sounds, soft, nontender, without masses or organomegaly palpable  Extremities: BL LE: No pretibial edema normal ROM and strength.  Skin: Hair: Texture and amount normal with gender appropriate distribution Skin Inspection: No rashes Skin Palpation: Skin temperature, texture, and thickness normal to palpation  Neuro: Cranial nerves: II - XII grossly intact  Motor: Normal strength throughout DTRs: 2+ and symmetric in UE without delay in relaxation phase  Mental Status: Judgment, insight: Intact Orientation: Oriented to time, place, and person Mood and affect: No depression, anxiety, or agitation     DATA  REVIEWED: Results for AMIIR, WOODING (MRN [id number]) as of 09/14/2019 07:56  Ref. Range 09/13/2019 14:03  TSH Latest Ref Range: 0.35 - 4.50 uIU/mL 0.60  Triiodothyronine (T3) Latest Ref Range: 76 - 181 ng/dL 120  T4,Free(Direct) Latest Ref Range: 0.60 - 1.60 ng/dL 0.86     ASSESSMENT/PLAN/RECOMMENDATIONS:   1. Subclinical hyperthyroidism:   -Patient is clinically euthyroid -No local neck symptoms -We discussed D/D of autonomous thyroid nodules, Graves' disease, thyroiditis versus assay interference. -We did discuss the high risk of osteoporosis as well as cardiac arrhythmias and and treated subclinical hyper thyroidism in patients over the age of 48. -We also discussed proceeding with thyroid uptake and scan if his TSH continues to be low, but repeat TFTs today are normal so we will hold off on uptake and scan - TRAb levels pending   F/U in 4 months    Addendum: discussed normal TFT results with Megan on 09/14/2019 @ 9 AM  Signed electronically by: Mack Guise, MD  [facility] Endocrinology  [facility] [address] [place], [place] [address] Phone: [phone] FAX: [phone]   CC: Vivi Barrack, MD [address] [place] [address] Phone: [phone] Fax: [phone]   Return to Endocrinology clinic as below: Future Appointments  Date Time Provider [place]  09/14/2019  9:20 AM Irene Shipper, MD [facility]  01/13/2020  8:30 AM Cailen Mihalik, Melanie Crazier, MD [facility] None

## 2019-09-14 ENCOUNTER — Ambulatory Visit (INDEPENDENT_AMBULATORY_CARE_PROVIDER_SITE_OTHER): Payer: Medicare Other | Admitting: Internal Medicine

## 2019-09-14 ENCOUNTER — Encounter: Payer: Self-pay | Admitting: Internal Medicine

## 2019-09-14 ENCOUNTER — Other Ambulatory Visit (INDEPENDENT_AMBULATORY_CARE_PROVIDER_SITE_OTHER): Payer: Medicare Other

## 2019-09-14 VITALS — BP 116/62 | HR 76 | Temp 97.5°F | Ht 70.0 in | Wt 193.4 lb

## 2019-09-14 DIAGNOSIS — K709 Alcoholic liver disease, unspecified: Secondary | ICD-10-CM

## 2019-09-14 DIAGNOSIS — E0590 Thyrotoxicosis, unspecified without thyrotoxic crisis or storm: Secondary | ICD-10-CM | POA: Insufficient documentation

## 2019-09-14 DIAGNOSIS — R945 Abnormal results of liver function studies: Secondary | ICD-10-CM

## 2019-09-14 DIAGNOSIS — Z86010 Personal history of colonic polyps: Secondary | ICD-10-CM | POA: Diagnosis not present

## 2019-09-14 DIAGNOSIS — Z01818 Encounter for other preprocedural examination: Secondary | ICD-10-CM | POA: Diagnosis not present

## 2019-09-14 DIAGNOSIS — R7989 Other specified abnormal findings of blood chemistry: Secondary | ICD-10-CM

## 2019-09-14 LAB — CBC WITH DIFFERENTIAL/PLATELET
Basophils Absolute: 0.1 10*3/uL (ref 0.0–0.1)
Basophils Relative: 1 % (ref 0.0–3.0)
Eosinophils Absolute: 0.5 10*3/uL (ref 0.0–0.7)
Eosinophils Relative: 6.2 % — ABNORMAL HIGH (ref 0.0–5.0)
HCT: 35.2 % — ABNORMAL LOW (ref 39.0–52.0)
Hemoglobin: 11.7 g/dL — ABNORMAL LOW (ref 13.0–17.0)
Lymphocytes Relative: 16.5 % (ref 12.0–46.0)
Lymphs Abs: 1.4 10*3/uL (ref 0.7–4.0)
MCHC: 33.2 g/dL (ref 30.0–36.0)
MCV: 98.6 fl (ref 78.0–100.0)
Monocytes Absolute: 0.6 10*3/uL (ref 0.1–1.0)
Monocytes Relative: 7.1 % (ref 3.0–12.0)
Neutro Abs: 5.9 10*3/uL (ref 1.4–7.7)
Neutrophils Relative %: 69.2 % (ref 43.0–77.0)
Platelets: 290 10*3/uL (ref 150.0–400.0)
RBC: 3.57 Mil/uL — ABNORMAL LOW (ref 4.22–5.81)
RDW: 13.2 % (ref 11.5–15.5)
WBC: 8.5 10*3/uL (ref 4.0–10.5)

## 2019-09-14 LAB — BASIC METABOLIC PANEL
BUN: 12 mg/dL (ref 6–23)
CO2: 30 mEq/L (ref 19–32)
Calcium: 9.4 mg/dL (ref 8.4–10.5)
Chloride: 103 mEq/L (ref 96–112)
Creatinine, Ser: 0.92 mg/dL (ref 0.40–1.50)
GFR: 80.41 mL/min (ref >60.00)
Glucose, Bld: 116 mg/dL — ABNORMAL HIGH (ref 70–99)
Potassium: 3.9 mEq/L (ref 3.5–5.1)
Sodium: 139 mEq/L (ref 135–145)

## 2019-09-14 LAB — FOLATE: Folate: >23.7 ng/mL (ref >5.9)

## 2019-09-14 LAB — TSH: TSH: 0.6 u[IU]/mL (ref 0.35–4.50)

## 2019-09-14 LAB — HEPATIC FUNCTION PANEL
ALT: 12 U/L (ref 0–53)
AST: 14 U/L (ref 0–37)
Albumin: 4.2 g/dL (ref 3.5–5.2)
Alkaline Phosphatase: 85 U/L (ref 39–117)
Bilirubin, Direct: 0.1 mg/dL (ref 0.0–0.3)
Total Bilirubin: 0.8 mg/dL (ref 0.2–1.2)
Total Protein: 7.1 g/dL (ref 6.0–8.3)

## 2019-09-14 LAB — IBC PANEL
Iron: 62 ug/dL (ref 42–165)
Saturation Ratios: 13.7 % — ABNORMAL LOW (ref 20.0–50.0)
Transferrin: 323 mg/dL (ref 212.0–360.0)

## 2019-09-14 LAB — FERRITIN: Ferritin: 12.1 ng/mL — ABNORMAL LOW (ref 22.0–322.0)

## 2019-09-14 LAB — IRON: Iron: 62 ug/dL (ref 42–165)

## 2019-09-14 LAB — PROTIME-INR
INR: 1 ratio (ref 0.8–1.0)
Prothrombin Time: 11.6 s (ref 9.6–13.1)

## 2019-09-14 MED ORDER — SUTAB 1479-225-188 MG PO TABS: 1.0000 | ORAL_TABLET | Freq: Once | ORAL | 0 refills | Status: AC

## 2019-09-14 NOTE — Progress Notes (Signed)
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 male, retired Wellsite geologist, who was sent today by his primary care provider regarding abnormal liver test.  The patient is accompanied by his son.  Patient has been seen in this facility previously regarding colon cancer screening and polyp surveillance.  Previous colonoscopy examinations 2004, 2007, 2011, and 2017.  Last examination with 7 tubular adenomas.  Follow-up in 3 years recommended.  He did receive a recall letter.  Patient was feeling poorly the first of this year for which he saw his primary care provider.  Complaints included fatigue, memory problems, and lower extremity edema.  Blood work revealed abnormal liver tests with AST 141, ALT 111, alkaline phosphatase 86, and total bilirubin 1.5.  Previous liver test July 2020 were entirely normal.  Additional blood work revealed anemia with hemoglobin 12.5 and MCV 100.7.  Normal platelets.  Elevated B12.  Depressed folate.  Folate supplement recommended.  At the time of his presentation the patient had been consuming large quantities of alcohol.  They report 1-1/2 gallons of bourbon per week.  He has been abstinent from alcohol for 2 months.  He is feeling better.  He denies a personal or family history of liver disease.  Most recent liver test from July 26, 2019 show AST 50, ALT 43, alkaline phosphatase 77, total bilirubin 0.6.  And abdominal ultrasound was performed August 01, 2019.  Limited to the right upper quadrant.  The liver revealed increased echogenicity but was otherwise normal including Doppler flow.  He has not been vaccinated against Covid.  REVIEW OF SYSTEMS:  All non-GI ROS negative unless otherwise stated in the HPI except for arthritis, itching, visual change, skin rash  Past Medical History:  Diagnosis Date  . Anemia   . Arthritis    hands  . Blood transfusion without reported diagnosis    for stomach ulcers  . Depression   . Hypertension   .  Kidney stone   . Multiple duodenal ulcers     Past Surgical History:  Procedure Laterality Date  . ABDOMINAL SURGERY     ulcers  . COLONOSCOPY    . LITHOTRIPSY      Social History Tyrone [name]  reports that he quit smoking about 39 years ago. He quit smokeless tobacco use about 12 years ago.  His smokeless tobacco use included chew. He reports current alcohol use. He reports that he does not use drugs.  family history is not on file.  No Known Allergies     PHYSICAL EXAMINATION: Vital signs: BP 116/62   Pulse 76   Temp (!) 97.5 F (36.4 C)   Ht 5\' 10"  (1.778 m)   Wt 193 lb 6.4 oz (87.7 kg)   BMI 27.75 kg/m   Constitutional: generally well-appearing, no acute distress Psychiatric: alert and oriented x3, cooperative Eyes: extraocular movements intact, anicteric, conjunctiva pink Mouth: oral pharynx moist, no lesions Neck: supple no lymphadenopathy Cardiovascular: heart regular rate and rhythm, no murmur Lungs: clear to auscultation bilaterally Abdomen: soft, nontender, nondistended, no obvious ascites, no peritoneal signs, normal bowel sounds, no organomegaly Rectal: Deferred to colonoscopy Extremities: no clubbing, cyanosis, or lower extremity edema bilaterally Skin: no lesions on visible extremities except for solar damage on the exposed surfaces Neuro: No focal deficits. No asterixis.    ASSESSMENT:  1.  Alcoholic liver disease.  Previously with alcoholic hepatitis.  Has improved clinically and biochemically with 2 months of abstinence from alcohol.  Not certain  if he has underlying fibrosis. 2.  History of multiple adenomatous colon polyps.  Overdue for surveillance.  Last examination January 2017   PLAN:  1.  Continue abstinence from all alcohol.  Stressed.  He agrees. 2.  Repeat laboratories today including comprehensive metabolic panel, CBC, PT/INR, and folate 3.  Obtain chronic hepatitis studies to assess for the presence of active disease or immunity  (or lack thereof) 4.  Iron studies to rule out concurrent hemochromatosis 5.  Surveillance colonoscopy.The nature of the procedure, as well as the risks, benefits, and alternatives were carefully and thoroughly reviewed with the patient. Ample time for discussion and questions allowed. The patient understood, was satisfied, and agreed to proceed. 6.  Office follow-up after the above 7.  May need vaccinated against hepatitis A and B 8.  Encouraged to obtain the Covid vaccination series 9.  Ongoing general medical care with Dr. [name] A total time of 60 minutes was spent preparing to see the patient, reviewing multiple laboratory test, reviewing x-ray studies, obtaining comprehensive history, performing comprehensive physical examination, counseling the patient and his son regarding his liver disease, ordering advanced laboratory testing and surveillance colonoscopy, and documenting all clinical information in the health record

## 2019-09-14 NOTE — Patient Instructions (Signed)
Your provider has requested that you go to the basement level for lab work before leaving today. Press "B" on the elevator. The lab is located at the first door on the left as you exit the elevator.   You have been scheduled for a colonoscopy. Please follow written instructions given to you at your visit today.  Please pick up your prep supplies at the pharmacy within the next 1-3 days. If you use inhalers (even only as needed), please bring them with you on the day of your procedure.   

## 2019-09-15 LAB — HEPATITIS C ANTIBODY
Hepatitis C Ab: NONREACTIVE
SIGNAL TO CUT-OFF: 0.01 (ref <1.00)

## 2019-09-15 LAB — HEPATITIS B SURFACE ANTIGEN: Hepatitis B Surface Ag: NONREACTIVE

## 2019-09-15 LAB — HEPATITIS B SURFACE ANTIBODY,QUALITATIVE: Hep B S Ab: NONREACTIVE

## 2019-09-19 LAB — TRAB (TSH RECEPTOR BINDING ANTIBODY): TRAB: <1 IU/L (ref <=2.00)

## 2019-09-19 LAB — T3: T3, Total: 120 ng/dL (ref 76–181)

## 2019-09-26 ENCOUNTER — Other Ambulatory Visit: Payer: Self-pay | Admitting: Internal Medicine

## 2019-09-26 ENCOUNTER — Ambulatory Visit (INDEPENDENT_AMBULATORY_CARE_PROVIDER_SITE_OTHER): Payer: Medicare Other

## 2019-09-26 DIAGNOSIS — Z1159 Encounter for screening for other viral diseases: Secondary | ICD-10-CM

## 2019-09-26 DIAGNOSIS — Z1152 Encounter for screening for COVID-19: Secondary | ICD-10-CM | POA: Diagnosis not present

## 2019-09-26 LAB — SARS CORONAVIRUS 2 (TAT 6-24 HRS): SARS Coronavirus 2: NEGATIVE

## 2019-09-28 ENCOUNTER — Encounter: Payer: Self-pay | Admitting: Internal Medicine

## 2019-09-28 ENCOUNTER — Ambulatory Visit (AMBULATORY_SURGERY_CENTER): Payer: Medicare Other | Admitting: Internal Medicine

## 2019-09-28 ENCOUNTER — Other Ambulatory Visit: Payer: Self-pay

## 2019-09-28 VITALS — BP 151/71 | HR 69 | Temp 97.5°F | Resp 14 | Ht 70.0 in | Wt 195.0 lb

## 2019-09-28 DIAGNOSIS — I10 Essential (primary) hypertension: Secondary | ICD-10-CM | POA: Diagnosis not present

## 2019-09-28 DIAGNOSIS — D124 Benign neoplasm of descending colon: Secondary | ICD-10-CM | POA: Diagnosis not present

## 2019-09-28 DIAGNOSIS — K219 Gastro-esophageal reflux disease without esophagitis: Secondary | ICD-10-CM | POA: Diagnosis not present

## 2019-09-28 DIAGNOSIS — Z86010 Personal history of colonic polyps: Secondary | ICD-10-CM | POA: Diagnosis not present

## 2019-09-28 MED ORDER — SODIUM CHLORIDE 0.9 % IV SOLN
500.0000 mL | INTRAVENOUS | Status: DC
Start: 2019-09-28 — End: 2019-09-28

## 2019-09-28 NOTE — Patient Instructions (Signed)
Please read handouts provided. Continue present medications. Await pathology results. Please follow up with Dr. Henrene Pastor in 3 months regarding checkup on liver disease.     YOU HAD AN ENDOSCOPIC PROCEDURE TODAY AT [facility]:   Refer to the procedure report that was given to you for any specific questions about what was found during the examination.  If the procedure report does not answer your questions, please call your gastroenterologist to clarify.  If you requested that your care partner not be given the details of your procedure findings, then the procedure report has been included in a sealed envelope for you to review at your convenience later.  YOU SHOULD EXPECT: Some feelings of bloating in the abdomen. Passage of more gas than usual.  Walking can help get rid of the air that was put into your GI tract during the procedure and reduce the bloating. If you had a lower endoscopy (such as a colonoscopy or flexible sigmoidoscopy) you may notice spotting of blood in your stool or on the toilet paper. If you underwent a bowel prep for your procedure, you may not have a normal bowel movement for a few days.  Please Note:  You might notice some irritation and congestion in your nose or some drainage.  This is from the oxygen used during your procedure.  There is no need for concern and it should clear up in a day or so.  SYMPTOMS TO REPORT IMMEDIATELY:   Following lower endoscopy (colonoscopy or flexible sigmoidoscopy):  Excessive amounts of blood in the stool  Significant tenderness or worsening of abdominal pains  Swelling of the abdomen that is new, acute  Fever of 100F or higher   For urgent or emergent issues, a gastroenterologist can be reached at any hour by calling [phone]. Do not use MyChart messaging for urgent concerns.    DIET:  We do recommend a small meal at first, but then you may proceed to your regular diet.  Drink plenty of fluids but you should  avoid alcoholic beverages for 24 hours.  ACTIVITY:  You should plan to take it easy for the rest of today and you should NOT DRIVE or use heavy machinery until tomorrow (because of the sedation medicines used during the test).    FOLLOW UP: Our staff will call the number listed on your records 48-72 hours following your procedure to check on you and address any questions or concerns that you may have regarding the information given to you following your procedure. If we do not reach you, we will leave a message.  We will attempt to reach you two times.  During this call, we will ask if you have developed any symptoms of COVID 19. If you develop any symptoms (ie: fever, flu-like symptoms, shortness of breath, cough etc.) before then, please call [phone].  If you test positive for Covid 19 in the 2 weeks post procedure, please call and report this information to [place].    If any biopsies were taken you will be contacted by phone or by letter within the next 1-3 weeks.  Please call us at [phone] if you have not heard about the biopsies in 3 weeks.    SIGNATURES/CONFIDENTIALITY: You and/or your care partner have signed paperwork which will be entered into your electronic medical record.  These signatures attest to the fact that that the information above on your After Visit Summary has been reviewed and is understood.  Full responsibility of the confidentiality  of this discharge information lies with you and/or your care-partner.

## 2019-09-28 NOTE — Progress Notes (Signed)
Report to PACU, RN, vss, BBS= Clear.  

## 2019-09-28 NOTE — Progress Notes (Signed)
Called to room to assist during endoscopic procedure.  Patient ID and intended procedure confirmed with present staff. Received instructions for my participation in the procedure from the performing physician.  

## 2019-09-28 NOTE — Op Note (Addendum)
[facility] Patient Name: Tyrone [name] Procedure Date: 09/28/2019 12:18 PM MRN: [id number] Endoscopist: Docia Chuck. Henrene Pastor , MD Age: 74 Referring MD:  Date of Birth: 03-03-1946 Gender: Male Account #: [id number] Procedure:                Colonoscopy with cold snare polypectomy x 1 Indications:              High risk colon cancer surveillance: Personal                            history of multiple (3 or more) adenomas. Previous                            examinations 2004, 2007, 2011, 2017 Medicines:                Monitored Anesthesia Care Procedure:                Pre-Anesthesia Assessment:                           - Prior to the procedure, a History and Physical                            was performed, and patient medications and                            allergies were reviewed. The patient's tolerance of                            previous anesthesia was also reviewed. The risks                            and benefits of the procedure and the sedation                            options and risks were discussed with the patient.                            All questions were answered, and informed consent                            was obtained. Prior Anticoagulants: The patient has                            taken no previous anticoagulant or antiplatelet                            agents. ASA Grade Assessment: II - A patient with                            mild systemic disease. After reviewing the risks                            and benefits, the patient was deemed in  satisfactory condition to undergo the procedure.                           After obtaining informed consent, the colonoscope                            was passed under direct vision. Throughout the                            procedure, the patient's blood pressure, pulse, and                            oxygen saturations were monitored continuously. The       Colonoscope was introduced through the anus and                            advanced to the the cecum, identified by                            appendiceal orifice and ileocecal valve. The                            ileocecal valve, appendiceal orifice, and rectum                            were photographed. The quality of the bowel                            preparation was excellent. The colonoscopy was                            performed without difficulty. The patient tolerated                            the procedure well. The bowel preparation used was                            SUPREP via split dose instruction. Scope In: 12:30:11 PM Scope Out: 12:45:21 PM Scope Withdrawal Time: 0 hours 11 minutes 59 seconds  Total Procedure Duration: 0 hours 15 minutes 10 seconds  Findings:                 A 2 mm polyp was found in the descending colon. The                            polyp was removed with a cold snare. Resection and                            retrieval were complete.                           The exam was otherwise without abnormality on  direct and retroflexion views. Complications:            No immediate complications. Estimated blood loss:                            None. Estimated Blood Loss:     Estimated blood loss: none. Impression:               - One 2 mm polyp in the descending colon, removed                            with a cold snare. Resected and retrieved.                           - The examination was otherwise normal on direct                            and retroflexion views. Recommendation:           - Repeat colonoscopy in 5 years for surveillance.                           - Patient has a contact number available for                            emergencies. The signs and symptoms of potential                            delayed complications were discussed with the                            patient. Return to normal activities  tomorrow.                            Written discharge instructions were provided to the                            patient.                           - Resume previous diet.                           - Continue present medications.                           - Await pathology results.                           - OFFICE FOLLOW UP WITH DR. Henrene Pastor IN 3 MONTHS                            regarding checkup on liver disease Docia Chuck. Henrene Pastor, MD 09/28/2019 12:53:13 PM This report has been signed electronically.

## 2019-09-30 ENCOUNTER — Telehealth: Payer: Self-pay | Admitting: *Deleted

## 2019-09-30 ENCOUNTER — Encounter: Payer: Self-pay | Admitting: Internal Medicine

## 2019-09-30 NOTE — Telephone Encounter (Signed)
  Follow up Call-  Call back number 09/28/2019  Post procedure Call Back phone  # [phone]  Permission to leave phone message Yes  Some recent data might be hidden     Patient questions:  Do you have a fever, pain , or abdominal swelling? No. Pain Score  0 *  Have you tolerated food without any problems? Yes.    Have you been able to return to your normal activities? Yes.    Do you have any questions about your discharge instructions: Diet   No. Medications  No. Follow up visit  No.  Do you have questions or concerns about your Care? No.  Actions: * If pain score is 4 or above: No action needed, pain <4.  1. Have you developed a fever since your procedure? no  2.   Have you had an respiratory symptoms (SOB or cough) since your procedure? no  3.   Have you tested positive for COVID 19 since your procedure no  4.   Have you had any family members/close contacts diagnosed with the COVID 19 since your procedure?  no   If yes to any of these questions please route to Joylene John, RN and Erenest Rasher, RN

## 2019-10-04 ENCOUNTER — Ambulatory Visit (INDEPENDENT_AMBULATORY_CARE_PROVIDER_SITE_OTHER): Payer: Medicare Other | Admitting: Family Medicine

## 2019-10-04 ENCOUNTER — Encounter: Payer: Self-pay | Admitting: Family Medicine

## 2019-10-04 ENCOUNTER — Other Ambulatory Visit: Payer: Self-pay

## 2019-10-04 VITALS — BP 120/80 | HR 67 | Temp 98.0°F | Ht 70.0 in | Wt 196.0 lb

## 2019-10-04 DIAGNOSIS — M5416 Radiculopathy, lumbar region: Secondary | ICD-10-CM | POA: Diagnosis not present

## 2019-10-04 DIAGNOSIS — I10 Essential (primary) hypertension: Secondary | ICD-10-CM | POA: Diagnosis not present

## 2019-10-04 DIAGNOSIS — E0590 Thyrotoxicosis, unspecified without thyrotoxic crisis or storm: Secondary | ICD-10-CM | POA: Diagnosis not present

## 2019-10-04 DIAGNOSIS — K7581 Nonalcoholic steatohepatitis (NASH): Secondary | ICD-10-CM | POA: Insufficient documentation

## 2019-10-04 DIAGNOSIS — Z23 Encounter for immunization: Secondary | ICD-10-CM

## 2019-10-04 MED ORDER — CYCLOBENZAPRINE HCL 10 MG PO TABS
10.0000 mg | ORAL_TABLET | Freq: Three times a day (TID) | ORAL | 0 refills | Status: DC | PRN
Start: 2019-10-04 — End: 2020-11-20

## 2019-10-04 MED ORDER — GABAPENTIN 100 MG PO CAPS
200.0000 mg | ORAL_CAPSULE | Freq: Every day | ORAL | 3 refills | Status: DC
Start: 2019-10-04 — End: 2020-01-24

## 2019-10-04 NOTE — Assessment & Plan Note (Signed)
Encouraged continued cessation from alcohol.  He will continue following with GI.  Will give first dose of Twinrix today.  He will come back in a month for second dose.

## 2019-10-04 NOTE — Assessment & Plan Note (Signed)
Continue management per endocrinology. 

## 2019-10-04 NOTE — Assessment & Plan Note (Signed)
Worsened.  Will restart gabapentin 200 mg nightly and Flexeril as needed.  Advised him to follow-up with sports medicine.

## 2019-10-04 NOTE — Progress Notes (Signed)
   Tyrone [name] is a 74 y.o. male who presents today for an office visit.  Assessment/Plan:  Chronic Problems Addressed Today: Steatohepatitis Encouraged continued cessation from alcohol.  He will continue following with GI.  Will give first dose of Twinrix today.  He will come back in a month for second dose.  Essential hypertension At goal.  Continue amlodipine 10 mg daily and ramipril 10 mg twice daily.  Chronic left lumbar radiculopathy Worsened.  Will restart gabapentin 200 mg nightly and Flexeril as needed.  Advised him to follow-up with sports medicine.  Subclinical hyperthyroidism Continue management per endocrinology.    Subjective:  HPI:  Patient seen 2 months ago with concerns for confusion.  We checked labs at that time and he was found to have elevated ammonia, low folic acid, and low TSH.  He had an ultrasound which showed possible cirrhosis and was referred to GI.  Had labs rechecked which showed significant improvement after abstaining from alcohol.  He did not have any antibodies to hepatitis B or C and was recommended to get vaccinated.  GI also performed colonoscopy and was found to have benign adenomas.  He was recommended to have follow-up colonoscopy in 5 years.  He saw endocrinology for low TSH and was diagnosed with subclinical hyperthyroidism.  He will be following up with them in 3 months.   He is doing very well today.  He has not had any alcohol for about 3 months.   He still has ongoing issues with chronic low back pain.  Symptoms of flareup recently.  Worse with walking and worse with motion.  Seen sports medicine in the past but has not seen them for about 6 months.  Was previously on gabapentin and Flexeril.       Objective:  Physical Exam: BP 120/80 (BP Location: Left Arm, Patient Position: Sitting, Cuff Size: Normal)   Pulse 67   Temp 98 F (36.7 C) (Temporal)   Ht 5\' 10"  (1.778 m)   Wt 196 lb (88.9 kg)   SpO2 100%   BMI 28.12 kg/m     Gen: No acute distress, resting comfortably CV: Regular rate and rhythm with no murmurs appreciated Pulm: Normal work of breathing, clear to auscultation bilaterally with no crackles, wheezes, or rhonchi Neuro: Grossly normal, moves all extremities Psych: Normal affect and thought content      Breslyn Abdo M. [name], MD 10/04/2019 9:37 AM

## 2019-10-04 NOTE — Assessment & Plan Note (Signed)
At goal.  Continue amlodipine 10 mg daily and ramipril 10 mg twice daily.

## 2019-10-04 NOTE — Progress Notes (Signed)
4 x

## 2019-10-04 NOTE — Patient Instructions (Signed)
It was very nice to see you today!  We will give you your first dose of vaccine today.  Please come back in a month to get your second dose.  We will restart your gabapentin Flexeril.  Please schedule an appointment with Dr. Tamala Julian soon.  Take care, Dr [name]  Please try these tips to maintain a healthy lifestyle:   Eat at least 3 REAL meals and 1-2 snacks per day.  Aim for no more than 5 hours between eating.  If you eat breakfast, please do so within one hour of getting up.    Each meal should contain half fruits/vegetables, one quarter protein, and one quarter carbs (no bigger than a computer mouse)   Cut down on sweet beverages. This includes juice, soda, and sweet tea.     Drink at least 1 glass of water with each meal and aim for at least 8 glasses per day   Exercise at least 150 minutes every week.

## 2019-10-13 ENCOUNTER — Telehealth: Payer: Self-pay | Admitting: Family Medicine

## 2019-10-13 NOTE — Telephone Encounter (Signed)
Patient Daughter in Law calling to get info on some medications for patient. She is not on his [employer] form to be able to talk to, so she said she would just DC the meds. I advised her to call Pharmacy to make sure he was taken off safely. Im putting this In  his documentation in case of medical problems from [place] meds without auth from Dr Yong Channel. Please Advise.

## 2019-10-13 NOTE — Telephone Encounter (Signed)
The medications are Flexeril,Neurontin.jk

## 2019-10-14 NOTE — Telephone Encounter (Signed)
Please call patient and get more information - he should not abruptly stop his medications.  Tyrone [name]. [name], MD 10/14/2019 3:09 PM

## 2019-10-18 NOTE — Telephone Encounter (Signed)
LMOVM for Tyrone [name] to call back to discuss medications

## 2019-10-19 ENCOUNTER — Encounter: Payer: Self-pay | Admitting: Family Medicine

## 2019-10-19 ENCOUNTER — Ambulatory Visit (INDEPENDENT_AMBULATORY_CARE_PROVIDER_SITE_OTHER): Payer: Medicare Other | Admitting: Family Medicine

## 2019-10-19 ENCOUNTER — Other Ambulatory Visit: Payer: Self-pay

## 2019-10-19 VITALS — BP 144/98 | HR 113 | Ht 70.0 in | Wt 196.0 lb

## 2019-10-19 DIAGNOSIS — M130 Polyarthritis, unspecified: Secondary | ICD-10-CM

## 2019-10-19 DIAGNOSIS — M5416 Radiculopathy, lumbar region: Secondary | ICD-10-CM | POA: Diagnosis not present

## 2019-10-19 MED ORDER — PREDNISONE 20 MG PO TABS
20.0000 mg | ORAL_TABLET | Freq: Every day | ORAL | 0 refills | Status: DC
Start: 2019-10-19 — End: 2019-11-09

## 2019-10-19 MED ORDER — KETOROLAC TROMETHAMINE 30 MG/ML IJ SOLN
30.0000 mg | Freq: Once | INTRAMUSCULAR | Status: AC
  Administered 2019-10-19: 30 mg via INTRAMUSCULAR

## 2019-10-19 MED ORDER — METHYLPREDNISOLONE ACETATE 40 MG/ML IJ SUSP
40.0000 mg | Freq: Once | INTRAMUSCULAR | Status: AC
  Administered 2019-10-19: 40 mg via INTRAMUSCULAR

## 2019-10-19 NOTE — Telephone Encounter (Signed)
Call Patient at [phone] LVM to return call  Daughter in law stated gabapentin make him had bad dreams and dizziness. He been taking TID. And stopped medication

## 2019-10-19 NOTE — Telephone Encounter (Signed)
Megan call back to discuss patients medication

## 2019-10-19 NOTE — Progress Notes (Signed)
[place] [facility] Phone: [phone] Subjective:   Tyrone [name], am serving as a scribe for Dr. Hulan Saas. This visit occurred during the SARS-CoV-2 public health emergency.  Safety protocols were in place, including screening questions prior to the visit, additional usage of staff PPE, and extensive cleaning of exam room while observing appropriate contact time as indicated for disinfecting solutions.  I'm seeing this patient by the request  of:  Vivi Barrack, MD  CC: Back pain, left-sided  [id number]   04/26/2019 Patient is doing significantly better at this time.  We discussed the possibility of viscosupplementation.  Patient wants to hold on any type of injection at the moment.  Discussed icing regimen, topical anti-inflammatories, follow-up with me again in 4 to 6 weeks if doing well patient can follow-up as needed  Update 10/19/2019 Tyrone [name] is a 74 y.o. male coming in with complaint of low back pain. Patient states that his pain is worse with walking. Intermittent radiation in left leg. Using Aleve for pain. Is using cyclobenzaprine and gabapentin. Does feel he has some relief with medications. Feels like he is in a daze on these medications.  Patient states that he does not know if he can tolerate this long-term.   Xray August 2020 IMPRESSION: 1. Mild thoracolumbar spine scoliosis. Diffuse multilevel severe degenerative change. Stable mild anterolisthesis L4 on L5. Stable mild T11 compression fracture. [name] acute bony abnormality.  2. Prominent calcific density noted over the left kidney consistent with nephrolithiasis.  3.  Aortoiliac atherosclerotic vascular disease.     Past Medical History:  Diagnosis Date  . Anemia   . Arthritis    hands  . Blood transfusion without reported diagnosis    for stomach ulcers  . Depression   . Hypertension   . Kidney stone   . Multiple duodenal  ulcers    Past Surgical History:  Procedure Laterality Date  . ABDOMINAL SURGERY     ulcers  . COLONOSCOPY    . LITHOTRIPSY     Social History   Socioeconomic History  . Marital status: Married    Spouse name: Not on file  . Number of children: Not on file  . Years of education: Not on file  . Highest education level: Not on file  Occupational History  . Not on file  Tobacco Use  . Smoking status: Former Smoker    Quit date: 05/30/1980    Years since quitting: 39.4  . Smokeless tobacco: Former Systems developer    Types: [place] date: 05/30/2007  Substance and Sexual Activity  . Alcohol use: Not Currently    Alcohol/week: 0.0 standard drinks    Comment: sober 2.5 months  . Drug use: [name]  . Sexual activity: Not on file  Other Topics Concern  . Not on file  Social History Narrative  . Not on file   Social Determinants of Health   Financial Resource Strain:   . Difficulty of Paying Living Expenses:   Food Insecurity:   . Worried About Charity fundraiser in the Last Year:   . Arboriculturist in the Last Year:   Transportation Needs:   . Film/video editor (Medical):   Marland Kitchen Lack of Transportation (Non-Medical):   Physical Activity:   . Days of Exercise per Week:   . Minutes of Exercise per Session:   Stress:   . Feeling of Stress :  Social Connections:   . Frequency of Communication with Friends and Family:   . Frequency of Social Gatherings with Friends and Family:   . Attends Religious Services:   . Active Member of Clubs or Organizations:   . Attends Archivist Meetings:   Marland Kitchen Marital Status:    [name] Known Allergies Family History  Problem Relation Age of Onset  . Colon cancer Neg Hx   . Esophageal cancer Neg Hx   . Rectal cancer Neg Hx   . Stomach cancer Neg Hx     Current Outpatient Medications (Endocrine & Metabolic):  .  predniSONE (DELTASONE) 20 MG tablet, Take 1 tablet (20 mg total) by mouth daily with breakfast.  Current Outpatient  Medications (Cardiovascular):  .  amLODipine (NORVASC) 10 MG tablet, Take 1 tablet (10 mg total) by mouth daily. .  ramipril (ALTACE) 10 MG capsule, Take 10 mg by mouth 2 (two) times daily.   Current Outpatient Medications (Analgesics):  .  allopurinol (ZYLOPRIM) 300 MG tablet, Take 1 tablet by mouth daily.  Current Outpatient Medications (Hematological):  Marland Kitchen  Cyanocobalamin (B-12) 5000 MCG CAPS, Take by mouth 2 (two) times daily. .  folic acid (FOLVITE) 1 MG tablet, Take 1 mg by mouth daily.  Current Outpatient Medications (Other):  .  citalopram (CELEXA) 40 MG tablet, Take 40 mg by mouth daily. .  cyclobenzaprine (FLEXERIL) 10 MG tablet, Take 1 tablet (10 mg total) by mouth 3 (three) times daily as needed for muscle spasms. Marland Kitchen  gabapentin (NEURONTIN) 100 MG capsule, Take 2 capsules (200 mg total) by mouth at bedtime. .  pantoprazole (PROTONIX) 40 MG tablet, TAKE 1 TABLET BY MOUTH TWICE DAILY   Reviewed prior external information including notes and imaging from  primary care provider As well as notes that were available from care everywhere and other healthcare systems.  Past medical history, social, surgical and family history all reviewed in electronic medical record.  [name] pertanent information unless stated regarding to the chief complaint.   Review of Systems:  [name] headache, visual changes, nausea, vomiting, diarrhea, constipation, dizziness, abdominal pain, skin rash, fevers, chills, night sweats, weight loss, swollen lymph nodes, body aches, joint swelling, chest pain, shortness of breath, mood changes. POSITIVE muscle aches  Objective  Blood pressure (!) 144/98, pulse (!) 113, height 5\' 10"  (1.778 m), weight 196 lb (88.9 kg), SpO2 99 %.   General: [name] apparent distress alert and oriented x3 mood and affect normal, dressed appropriately.  HEENT: Pupils equal, extraocular movements intact  Respiratory: Patient's speak in full sentences and does not appear short of breath    Cardiovascular: [name] lower extremity edema, non tender, [name] erythema  Neuro: Cranial nerves II through XII are intact, neurovascularly intact in all extremities with 2+ DTRs and 2+ pulses.  Gait antalgic with foot drop noted MSK: Arthritic changes of multiple joints Back exam still has loss of lordosis with degenerative scoliosis noted.  Severe tenderness to palpation in the paraspinal musculature of the lumbar spine left greater than right.  Patient does have some mild worsening pain with straight leg test but [name] significant increase in weakness other than patient's baseline of the foot drop.   Impression and Recommendations:     The above documentation has been reviewed and is accurate and complete Lyndal Pulley, DO       Note: This dictation was prepared with Dragon dictation along with smaller phrase technology. Any transcriptional errors that result from this process are unintentional.

## 2019-10-19 NOTE — Assessment & Plan Note (Signed)
Chronic left-sided lumbar radiculopathy.  Patient does have left foot drop that is contributing.  Decrease gabapentin no secondary to side effects to 100 mg as well as the Flexeril to 5 mg up to 3 times a day but warned about daytime dosing.  Prednisone given and warned of potential side effects but hopefully patient will do very well.  Patient given Toradol and Depo-Medrol today and home exercises again.  Follow-up with me again in 2 to 4 weeks.  Worsening pain advanced imaging and epidurals could be beneficial

## 2019-10-19 NOTE — Patient Instructions (Signed)
1/2 gabapentin 1/2 flexeril Prednisone 20mg  daily for 5 days- start tomorrow Exercises See me again in 2-4 weeks

## 2019-11-01 ENCOUNTER — Other Ambulatory Visit: Payer: Self-pay

## 2019-11-01 ENCOUNTER — Ambulatory Visit (INDEPENDENT_AMBULATORY_CARE_PROVIDER_SITE_OTHER): Payer: Medicare Other | Admitting: *Deleted

## 2019-11-01 DIAGNOSIS — Z23 Encounter for immunization: Secondary | ICD-10-CM | POA: Diagnosis not present

## 2019-11-01 NOTE — Progress Notes (Signed)
Pt present for #2 Hep A-B vaccination  Given on Lt deltoid, Pt tolerated well  Advice to schedule appointment on Apr 05, 2020 for #3

## 2019-11-08 ENCOUNTER — Ambulatory Visit: Payer: Medicare Other

## 2019-11-09 ENCOUNTER — Other Ambulatory Visit: Payer: Self-pay

## 2019-11-09 ENCOUNTER — Encounter: Payer: Self-pay | Admitting: Family Medicine

## 2019-11-09 ENCOUNTER — Ambulatory Visit (INDEPENDENT_AMBULATORY_CARE_PROVIDER_SITE_OTHER): Payer: Medicare Other | Admitting: Family Medicine

## 2019-11-09 DIAGNOSIS — M5416 Radiculopathy, lumbar region: Secondary | ICD-10-CM | POA: Diagnosis not present

## 2019-11-09 MED ORDER — KETOROLAC TROMETHAMINE 60 MG/2ML IM SOLN
60.0000 mg | Freq: Once | INTRAMUSCULAR | Status: AC
  Administered 2019-11-09: 60 mg via INTRAMUSCULAR

## 2019-11-09 MED ORDER — METHYLPREDNISOLONE ACETATE 80 MG/ML IJ SUSP
80.0000 mg | Freq: Once | INTRAMUSCULAR | Status: AC
  Administered 2019-11-09: 80 mg via INTRAMUSCULAR

## 2019-11-09 NOTE — Progress Notes (Signed)
Tyrone [name] Phone: [phone] Subjective:   Tyrone [name], am serving as a scribe for Dr. Hulan [name]. This visit occurred during the SARS-CoV-2 public health emergency.  Safety protocols were in place, including screening questions prior to the visit, additional usage of staff PPE, and extensive cleaning of exam room while observing appropriate contact time as indicated for disinfecting solutions.   I'm seeing this patient by the request  of:  Tyrone Barrack, MD  CC: Left leg and back pain follow-up  [id number]   10/19/2019 Chronic left-sided lumbar radiculopathy.  Patient does have left foot drop that is contributing.  Decrease gabapentin [name] secondary to side effects to 100 mg as well as the Flexeril to 5 mg up to 3 times a day but warned about daytime dosing.  Prednisone given and warned of potential side effects but hopefully patient will do very well.  Patient given Toradol and Depo-Medrol today and home exercises again.  Follow-up with me again in 2 to 4 weeks.  Worsening pain advanced imaging and epidurals could be beneficial  Update 11/09/2019 Tyrone [name] is a 74 y.o. male coming in with complaint of lumbar radiculopathy. Finished prednsione which helped pain as well as cocktail injection. States that after waling 10 yards pain increases. Pain localized to lower back. Denies any radiating symptoms  Patient states that maybe the foot is not quite as severe as what it was previously.      Past Medical History:  Diagnosis Date  . Anemia   . Arthritis    hands  . Blood transfusion without reported diagnosis    for stomach ulcers  . Depression   . Hypertension   . Kidney stone   . Multiple duodenal ulcers    Past Surgical History:  Procedure Laterality Date  . ABDOMINAL SURGERY     ulcers  . COLONOSCOPY    . LITHOTRIPSY     Social History   Socioeconomic History  . Marital status:  Married    Spouse name: Not on file  . Number of children: Not on file  . Years of education: Not on file  . Highest education level: Not on file  Occupational History  . Not on file  Tobacco Use  . Smoking status: Former Smoker    Quit date: 05/30/1980    Years since quitting: 39.4  . Smokeless tobacco: Former Systems developer    Types: [place] date: 05/30/2007  Substance and Sexual Activity  . Alcohol use: Not Currently    Alcohol/week: 0.0 standard drinks    Comment: sober 2.5 months  . Drug use: [name]  . Sexual activity: Not on file  Other Topics Concern  . Not on file  Social History Narrative  . Not on file   Social Determinants of Health   Financial Resource Strain:   . Difficulty of Paying Living Expenses:   Food Insecurity:   . Worried About Charity fundraiser in the Last Year:   . Arboriculturist in the Last Year:   Transportation Needs:   . Film/video editor (Medical):   Marland Kitchen Lack of Transportation (Non-Medical):   Physical Activity:   . Days of Exercise per Week:   . Minutes of Exercise per Session:   Stress:   . Feeling of Stress :   Social Connections:   . Frequency of Communication with Friends and Family:   . Frequency of Social  Gatherings with Friends and Family:   . Attends Religious Services:   . Active Member of Clubs or Organizations:   . Attends Archivist Meetings:   Marland Kitchen Marital Status:    [name] Known Allergies Family History  Problem Relation Age of Onset  . Colon cancer Neg Hx   . Esophageal cancer Neg Hx   . Rectal cancer Neg Hx   . Stomach cancer Neg Hx      Current Outpatient Medications (Cardiovascular):  .  amLODipine (NORVASC) 10 MG tablet, Take 1 tablet (10 mg total) by mouth daily. .  ramipril (ALTACE) 10 MG capsule, Take 10 mg by mouth 2 (two) times daily.   Current Outpatient Medications (Analgesics):  .  allopurinol (ZYLOPRIM) 300 MG tablet, Take 1 tablet by mouth daily.  Current Outpatient Medications (Hematological):    Marland Kitchen  Cyanocobalamin (B-12) 5000 MCG CAPS, Take by mouth 2 (two) times daily. .  folic acid (FOLVITE) 1 MG tablet, Take 1 mg by mouth daily.  Current Outpatient Medications (Other):  .  citalopram (CELEXA) 40 MG tablet, Take 40 mg by mouth daily. .  cyclobenzaprine (FLEXERIL) 10 MG tablet, Take 1 tablet (10 mg total) by mouth 3 (three) times daily as needed for muscle spasms. Marland Kitchen  gabapentin (NEURONTIN) 100 MG capsule, Take 2 capsules (200 mg total) by mouth at bedtime. .  pantoprazole (PROTONIX) 40 MG tablet, TAKE 1 TABLET BY MOUTH TWICE DAILY   Reviewed prior external information including notes and imaging from  primary care provider As well as notes that were available from care everywhere and other healthcare systems.  Past medical history, social, surgical and family history all reviewed in electronic medical record.  [name] pertanent information unless stated regarding to the chief complaint.   Review of Systems:  [name] headache, visual changes, nausea, vomiting, diarrhea, constipation, dizziness, abdominal pain, skin rash, fevers, chills, night sweats, weight loss, swollen lymph nodes, body aches, joint swelling, chest pain, shortness of breath, mood changes. POSITIVE muscle aches  Objective  Blood pressure (!) 142/90, pulse 86, height 5\' 10"  (1.778 m), weight 197 lb (89.4 kg), SpO2 96 %.   General: [name] apparent distress alert and oriented x3 mood and affect normal, dressed appropriately.  HEENT: Pupils equal, extraocular movements intact  Respiratory: Patient's speak in full sentences and does not appear short of breath  Cardiovascular: [name] lower extremity edema, non tender, [name] erythema  Neuro: Cranial nerves II through XII are intact, neurovascularly intact in all extremities with 2+ DTRs and 2+ pulses.  Gait still mildly antalgic.  Patient's left foot drop is minorly improved already actually which is better than anticipated.  Patient is negative straight leg test which is an improvement as  well from previous exam.  Still discomfort and pain in the back.  Limited range of motion in all planes.    Impression and Recommendations:     The above documentation has been reviewed and is accurate and complete Tyrone Pulley, DO       Note: This dictation was prepared with Dragon dictation along with smaller phrase technology. Any transcriptional errors that result from this process are unintentional.

## 2019-11-09 NOTE — Patient Instructions (Signed)
Good to see you Keep doing exercises  As long as you are doing well see me again in 2 months call and let me know we will get MRI if worse

## 2019-11-09 NOTE — Assessment & Plan Note (Signed)
Continued left lumbar radiculopathy.  Discussed with patient in great length.  Patient has made some mild improvement in the foot drop seems to be minorly improved as well.  Encourage patient to continue the B12, Flexeril, gabapentin.  Patient has elected to try the Toradol and Depo-Medrol 1 more time because he had such good relief and is hoping that this will make a significant improvement and I think it is reasonable instead of doing more medications.  Patient knows if any worsening weakness I do feel that advanced imaging would be warranted at this time.  Increase activity slowly.  Follow-up again in 4 to 8 weeks

## 2019-11-26 DIAGNOSIS — Z23 Encounter for immunization: Secondary | ICD-10-CM | POA: Diagnosis not present

## 2019-12-09 ENCOUNTER — Telehealth: Payer: Self-pay | Admitting: Family Medicine

## 2019-12-09 NOTE — Telephone Encounter (Signed)
Left message for patient to call back and schedule Medicare Annual Wellness Visit (AWV) either virtually/audio only OR in office. Whatever the patients preference is.  Last AWV 12/03/18; please schedule at anytime with [facility] Advisor at [facility].  This should be a 45 minute visit.

## 2019-12-24 DIAGNOSIS — Z23 Encounter for immunization: Secondary | ICD-10-CM | POA: Diagnosis not present

## 2019-12-27 ENCOUNTER — Telehealth: Payer: Self-pay | Admitting: Family Medicine

## 2019-12-27 NOTE — Progress Notes (Signed)
  Chronic Care Management   Note  12/27/2019 Name: Tyrone [name] MRN: [id number] DOB: 1946/02/17  Tyrone [name] is a 74 y.o. year old male who is a primary care patient of Vivi Barrack, MD. I reached out to [name] by phone today in response to a referral sent by Tyrone [name]'s PCP, Vivi Barrack, MD.   Tyrone [name] was given information about Chronic Care Management services today including:  1. CCM service includes personalized support from designated clinical staff supervised by his physician, including individualized plan of care and coordination with other care providers 2. 24/7 contact phone numbers for assistance for urgent and routine care needs. 3. Service will only be billed when office clinical staff spend 20 minutes or more in a month to coordinate care. 4. Only one practitioner may furnish and bill the service in a calendar month. 5. The patient may stop CCM services at any time (effective at the end of the month) by phone call to the office staff.   Patient agreed to services and verbal consent obtained.   Follow up plan:   Tyrone [name] Upstream Scheduler

## 2020-01-10 ENCOUNTER — Encounter: Payer: Self-pay | Admitting: Family Medicine

## 2020-01-10 ENCOUNTER — Ambulatory Visit (INDEPENDENT_AMBULATORY_CARE_PROVIDER_SITE_OTHER): Payer: Medicare Other | Admitting: Family Medicine

## 2020-01-10 ENCOUNTER — Other Ambulatory Visit: Payer: Self-pay

## 2020-01-10 DIAGNOSIS — M5416 Radiculopathy, lumbar region: Secondary | ICD-10-CM

## 2020-01-10 NOTE — Progress Notes (Signed)
[place] [address] [place] Phone: [phone] Subjective:   I [name] am serving as a Education administrator for Dr. Hulan Saas.  This visit occurred during the SARS-CoV-2 public health emergency.  Safety protocols were in place, including screening questions prior to the visit, additional usage of staff PPE, and extensive cleaning of exam room while observing appropriate contact time as indicated for disinfecting solutions.   I'm seeing this patient by the request  of:  Vivi Barrack, MD  CC: Low back pain follow-up  [id number]   11/09/2019 Continued left lumbar radiculopathy.  Discussed with patient in great length.  Patient has made some mild improvement in the foot drop seems to be minorly improved as well.  Encourage patient to continue the B12, Flexeril, gabapentin.  Patient has elected to try the Toradol and Depo-Medrol 1 more time because he had such good relief and is hoping that this will make a significant improvement and I think it is reasonable instead of doing more medications.  Patient knows if any worsening weakness I do feel that advanced imaging would be warranted at this time.  Increase activity slowly.  Follow-up again in 4 to 8 weeks  Update 01/10/2020 MAXIMILIAN [name] is a 74 y.o. male coming in with complaint of low back pain. Patient states he is a lot better. Still experiencing pain.  States that it only happens when he is carrying different things.  Patient would like to continue to be active though.  Patient states no side effects to the gabapentin.  Has done relatively well overall.     Past Medical History:  Diagnosis Date  . Anemia   . Arthritis    hands  . Blood transfusion without reported diagnosis    for stomach ulcers  . Depression   . Hypertension   . Kidney stone   . Multiple duodenal ulcers    Past Surgical History:  Procedure Laterality Date  . ABDOMINAL SURGERY     ulcers  . COLONOSCOPY     . LITHOTRIPSY     Social History   Socioeconomic History  . Marital status: Married    Spouse name: Not on file  . Number of children: Not on file  . Years of education: Not on file  . Highest education level: Not on file  Occupational History  . Not on file  Tobacco Use  . Smoking status: Former Smoker    Quit date: 05/30/1980    Years since quitting: 39.6  . Smokeless tobacco: Former Systems developer    Types: [place] date: 05/30/2007  Substance and Sexual Activity  . Alcohol use: Not Currently    Alcohol/week: 0.0 standard drinks    Comment: sober 2.5 months  . Drug use: No  . Sexual activity: Not on file  Other Topics Concern  . Not on file  Social History Narrative  . Not on file   Social Determinants of Health   Financial Resource Strain:   . Difficulty of Paying Living Expenses: Not on file  Food Insecurity:   . Worried About Charity fundraiser in the Last Year: Not on file  . Ran Out of Food in the Last Year: Not on file  Transportation Needs:   . Lack of Transportation (Medical): Not on file  . Lack of Transportation (Non-Medical): Not on file  Physical Activity:   . Days of Exercise per Week: Not on file  . Minutes of Exercise  per Session: Not on file  Stress:   . Feeling of Stress : Not on file  Social Connections:   . Frequency of Communication with Friends and Family: Not on file  . Frequency of Social Gatherings with Friends and Family: Not on file  . Attends Religious Services: Not on file  . Active Member of Clubs or Organizations: Not on file  . Attends Archivist Meetings: Not on file  . Marital Status: Not on file   No Known Allergies Family History  Problem Relation Age of Onset  . Colon cancer Neg Hx   . Esophageal cancer Neg Hx   . Rectal cancer Neg Hx   . Stomach cancer Neg Hx      Current Outpatient Medications (Cardiovascular):  .  amLODipine (NORVASC) 10 MG tablet, Take 1 tablet (10 mg total) by mouth daily. .  ramipril  (ALTACE) 10 MG capsule, Take 10 mg by mouth 2 (two) times daily.   Current Outpatient Medications (Analgesics):  .  allopurinol (ZYLOPRIM) 300 MG tablet, Take 1 tablet by mouth daily.  Current Outpatient Medications (Hematological):  Marland Kitchen  Cyanocobalamin (B-12) 5000 MCG CAPS, Take by mouth 2 (two) times daily. .  folic acid (FOLVITE) 1 MG tablet, Take 1 mg by mouth daily.  Current Outpatient Medications (Other):  .  citalopram (CELEXA) 40 MG tablet, Take 40 mg by mouth daily. .  cyclobenzaprine (FLEXERIL) 10 MG tablet, Take 1 tablet (10 mg total) by mouth 3 (three) times daily as needed for muscle spasms. Marland Kitchen  gabapentin (NEURONTIN) 100 MG capsule, Take 2 capsules (200 mg total) by mouth at bedtime. .  pantoprazole (PROTONIX) 40 MG tablet, TAKE 1 TABLET BY MOUTH TWICE DAILY   Reviewed prior external information including notes and imaging from  primary care provider As well as notes that were available from care everywhere and other healthcare systems.  Past medical history, social, surgical and family history all reviewed in electronic medical record.  No pertanent information unless stated regarding to the chief complaint.   Review of Systems:  No headache, visual changes, nausea, vomiting, diarrhea, constipation, dizziness, abdominal pain, skin rash, fevers, chills, night sweats, weight loss, swollen lymph nodes, body aches, joint swelling, chest pain, shortness of breath, mood changes. POSITIVE muscle aches  Objective  Blood pressure 100/70, pulse 81, height 5\' 10"  (1.778 m), weight 201 lb (91.2 kg), SpO2 90 %.   General: No apparent distress alert and oriented x3 mood and affect normal, dressed appropriately.  HEENT: Pupils equal, extraocular movements intact  Respiratory: Patient's speak in full sentences and does not appear short of breath  Cardiovascular: No lower extremity edema, non tender, no erythema  Still antalgic gait Mild weakness noted of the left foot with dorsiflexion  compared to the contralateral side but has made some mild improvement.  Patient has some very mild discomfort with radicular symptoms going down the leg with straight leg test.  Neurovascularly intact distally.  Back exam does have loss of lordosis and decreased range of motion in all planes.    Impression and Recommendations:     The above documentation has been reviewed and is accurate and complete Lyndal Pulley, DO       Note: This dictation was prepared with Dragon dictation along with smaller phrase technology. Any transcriptional errors that result from this process are unintentional.

## 2020-01-10 NOTE — Assessment & Plan Note (Signed)
Patient is doing much better with conservative therapy at this time.  Discussed with patient icing regimen and home exercise, increase activity slowly over the course the next several weeks.  Follow-up with me again in 4 to 8 weeks

## 2020-01-10 NOTE — Patient Instructions (Signed)
Great to see you  Tyrone [name] is your friend Stay active  See me again in 3 months if needed

## 2020-01-13 ENCOUNTER — Other Ambulatory Visit: Payer: Self-pay

## 2020-01-13 ENCOUNTER — Encounter: Payer: Self-pay | Admitting: Internal Medicine

## 2020-01-13 ENCOUNTER — Ambulatory Visit (INDEPENDENT_AMBULATORY_CARE_PROVIDER_SITE_OTHER): Payer: Medicare Other | Admitting: Internal Medicine

## 2020-01-13 VITALS — BP 138/70 | HR 73 | Ht 70.0 in | Wt 198.4 lb

## 2020-01-13 DIAGNOSIS — E0590 Thyrotoxicosis, unspecified without thyrotoxic crisis or storm: Secondary | ICD-10-CM

## 2020-01-13 LAB — TSH: TSH: 0.5 u[IU]/mL (ref 0.35–4.50)

## 2020-01-13 LAB — T4, FREE: Free T4: 1.03 ng/dL (ref 0.60–1.60)

## 2020-01-13 NOTE — Patient Instructions (Signed)
-   We will check your thyroid today, if its normal then no need to follow up with endocrinology . I would recommend once a year check on your thyroid function through your primary care physician or sooner should you have symptoms of unexplained weight loss, diarrhea, or rapid heart beat.    - If your thyroid is off again , we will proceed with a thyroid scan

## 2020-01-13 NOTE — Progress Notes (Signed)
Name: Tyrone [name]  MRN/ DOB: [id number], 1945-10-06    Age/ Sex: 74 y.o., male     PCP: Vivi Barrack, MD   Reason for Endocrinology Evaluation: Subclinical hyperthyroidism     Initial Endocrinology Clinic Visit: 09/13/2019    PATIENT IDENTIFIER: Tyrone [name] is a 74 y.o., male with a past medical history of HTN and GERD. He has followed with [facility] since 09/13/2019 for consultative assistance with management of his subclinical hyperthyroidism.   HISTORICAL SUMMARY:   Pt has been noted to have a low TSH since 2019 with a nadir of 0.08 uIU/mL in 04/2018 with normal FT4.  Repeat labs at our clinic in 08/2019 were normal. No treatment was offered .    TRAb levels were undetectable.   No FH of thyroid disease  SUBJECTIVE:     Today (01/13/2020):  Tyrone [name] is here for subclinical hyperthyroidism.   Weight has been stable  Denies recent viral infections  Denies palpitations, diarrhea  Denies tremors.   No local neck symptoms.     No prior dx of osteoporosis or arrhythmia   ROS:  As per HPI.   HISTORY:  Past Medical History:  Past Medical History:  Diagnosis Date  . Anemia   . Arthritis    hands  . Blood transfusion without reported diagnosis    for stomach ulcers  . Depression   . Hypertension   . Kidney stone   . Multiple duodenal ulcers    Past Surgical History:  Past Surgical History:  Procedure Laterality Date  . ABDOMINAL SURGERY     ulcers  . COLONOSCOPY    . LITHOTRIPSY      Social History:  reports that he quit smoking about 39 years ago. He quit smokeless tobacco use about 12 years ago.  His smokeless tobacco use included chew. He reports previous alcohol use. He reports that he does not use drugs. Family History:  Family History  Problem Relation Age of Onset  . Colon cancer Neg Hx   . Esophageal cancer Neg Hx   . Rectal cancer Neg Hx   . Stomach cancer Neg Hx      HOME MEDICATIONS: Allergies as  of 01/13/2020   No Known Allergies     Medication List       Accurate as of January 13, 2020  8:51 AM. If you have any questions, ask your nurse or doctor.        allopurinol 300 MG tablet Commonly known as: ZYLOPRIM Take 1 tablet by mouth daily.   amLODipine 10 MG tablet Commonly known as: NORVASC Take 1 tablet (10 mg total) by mouth daily.   B-12 5000 MCG Caps Take by mouth 2 (two) times daily.   citalopram 40 MG tablet Commonly known as: CELEXA Take 40 mg by mouth daily.   cyclobenzaprine 10 MG tablet Commonly known as: FLEXERIL Take 1 tablet (10 mg total) by mouth 3 (three) times daily as needed for muscle spasms.   folic acid 1 MG tablet Commonly known as: FOLVITE Take 1 mg by mouth daily.   gabapentin 100 MG capsule Commonly known as: NEURONTIN Take 2 capsules (200 mg total) by mouth at bedtime.   pantoprazole 40 MG tablet Commonly known as: PROTONIX TAKE 1 TABLET BY MOUTH TWICE DAILY   ramipril 10 MG capsule Commonly known as: ALTACE Take 10 mg by mouth 2 (two) times daily.         OBJECTIVE:   PHYSICAL  EXAM: VS: BP 138/70 (BP Location: Left Arm, Patient Position: Sitting, Cuff Size: Normal)   Pulse 73   Ht 5\' 10"  (1.778 m)   Wt 198 lb 6.4 oz (90 kg)   SpO2 98%   BMI 28.47 kg/m    EXAM: General: Pt appears well and is in NAD  Neck: General: Supple without adenopathy. Thyroid: Thyroid size normal.  No goiter or nodules appreciated. No thyroid bruit.  Lungs: Clear with good BS bilat with no rales, rhonchi, or wheezes  Heart: Auscultation: RRR.  Abdomen: Normoactive bowel sounds, soft, nontender, without masses or organomegaly palpable  Extremities:  BL LE: No pretibial edema normal ROM and strength.  Mental Status: Judgment, insight: Intact Orientation: Oriented to time, place, and person Mood and affect: No depression, anxiety, or agitation     DATA REVIEWED: Results for Tyrone [name], [name] (MRN [id number]) as of 01/15/2020 13:37  Ref.  Range 01/13/2020 08:47  TSH Latest Ref Range: 0.35 - 4.50 uIU/mL 0.50  T4,Free(Direct) Latest Ref Range: 0.60 - 1.60 ng/dL 1.03      ASSESSMENT / PLAN / RECOMMENDATIONS:   1. Subclinical Hyperthyroidism:  - Resolved  - Pt is clinically euthyroid  - No local neck symptoms  - We discussed D/D of subclinical graves' disease vs autonomous thyroid nodule (s)  - No further endocrinology follow up is needed at this time, I would recommend annual TFT's through PCP's office or sooner if symptomatic . I will be happy to see him again when needed.    F/U as needed   Signed electronically by: Mack Guise, MD  [facility]  [address] [place], [place] [address] Phone: [phone] FAX: [phone]      CC: Vivi Barrack, [place] [address] Phone: [phone]  Fax: [phone]   Return to Endocrinology clinic as below: Future Appointments  Date Time Provider [place]  01/25/2020  8:40 AM Irene Shipper, MD [facility]  02/20/2020  2:00 PM [facility] CCM PHARMACIST [facility]  04/05/2020  9:20 AM Vivi Barrack, MD [facility]  04/10/2020  1:00 PM Lyndal Pulley, DO [facility] None

## 2020-01-16 ENCOUNTER — Encounter: Payer: Self-pay | Admitting: Internal Medicine

## 2020-01-24 ENCOUNTER — Other Ambulatory Visit: Payer: Self-pay | Admitting: Family Medicine

## 2020-01-25 ENCOUNTER — Other Ambulatory Visit (INDEPENDENT_AMBULATORY_CARE_PROVIDER_SITE_OTHER): Payer: Medicare Other

## 2020-01-25 ENCOUNTER — Ambulatory Visit (INDEPENDENT_AMBULATORY_CARE_PROVIDER_SITE_OTHER): Payer: Medicare Other | Admitting: Internal Medicine

## 2020-01-25 ENCOUNTER — Encounter: Payer: Self-pay | Admitting: Internal Medicine

## 2020-01-25 VITALS — BP 130/70 | HR 76 | Ht 67.0 in | Wt 200.0 lb

## 2020-01-25 DIAGNOSIS — R7989 Other specified abnormal findings of blood chemistry: Secondary | ICD-10-CM

## 2020-01-25 DIAGNOSIS — Z23 Encounter for immunization: Secondary | ICD-10-CM | POA: Diagnosis not present

## 2020-01-25 DIAGNOSIS — Z86010 Personal history of colonic polyps: Secondary | ICD-10-CM | POA: Diagnosis not present

## 2020-01-25 DIAGNOSIS — K709 Alcoholic liver disease, unspecified: Secondary | ICD-10-CM

## 2020-01-25 LAB — COMPREHENSIVE METABOLIC PANEL
ALT: 14 U/L (ref 0–53)
AST: 16 U/L (ref 0–37)
Albumin: 4.5 g/dL (ref 3.5–5.2)
Alkaline Phosphatase: 91 U/L (ref 39–117)
BUN: 13 mg/dL (ref 6–23)
CO2: 28 mEq/L (ref 19–32)
Calcium: 9.3 mg/dL (ref 8.4–10.5)
Chloride: 101 mEq/L (ref 96–112)
Creatinine, Ser: 0.98 mg/dL (ref 0.40–1.50)
GFR: 74.68 mL/min (ref >60.00)
Glucose, Bld: 122 mg/dL — ABNORMAL HIGH (ref 70–99)
Potassium: 4.3 mEq/L (ref 3.5–5.1)
Sodium: 135 mEq/L (ref 135–145)
Total Bilirubin: 0.7 mg/dL (ref 0.2–1.2)
Total Protein: 7.7 g/dL (ref 6.0–8.3)

## 2020-01-25 LAB — CBC WITH DIFFERENTIAL/PLATELET
Basophils Absolute: 0.1 10*3/uL (ref 0.0–0.1)
Basophils Relative: 0.9 % (ref 0.0–3.0)
Eosinophils Absolute: 0.2 10*3/uL (ref 0.0–0.7)
Eosinophils Relative: 3.1 % (ref 0.0–5.0)
HCT: 36 % — ABNORMAL LOW (ref 39.0–52.0)
Hemoglobin: 11.3 g/dL — ABNORMAL LOW (ref 13.0–17.0)
Lymphocytes Relative: 16.5 % (ref 12.0–46.0)
Lymphs Abs: 1.2 10*3/uL (ref 0.7–4.0)
MCHC: 31.4 g/dL (ref 30.0–36.0)
MCV: 87.5 fl (ref 78.0–100.0)
Monocytes Absolute: 0.5 10*3/uL (ref 0.1–1.0)
Monocytes Relative: 6.2 % (ref 3.0–12.0)
Neutro Abs: 5.5 10*3/uL (ref 1.4–7.7)
Neutrophils Relative %: 73.3 % (ref 43.0–77.0)
Platelets: 274 10*3/uL (ref 150.0–400.0)
RBC: 4.11 Mil/uL — ABNORMAL LOW (ref 4.22–5.81)
RDW: 14.8 % (ref 11.5–15.5)
WBC: 7.5 10*3/uL (ref 4.0–10.5)

## 2020-01-25 LAB — PROTIME-INR
INR: 1.1 ratio — ABNORMAL HIGH (ref 0.8–1.0)
Prothrombin Time: 12.1 s (ref 9.6–13.1)

## 2020-01-25 NOTE — Patient Instructions (Signed)
Your provider has requested that you go to the basement level for lab work before leaving today. Press "B" on the elevator. The lab is located at the first door on the left as you exit the elevator.  You have been given your first twinrix vaccination today.  Please return for your second injection on _________________.  Please follow up in one year

## 2020-01-25 NOTE — Progress Notes (Signed)
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  From that evaluation   September 14, 2019: "Sent today by his primary care provider regarding abnormal liver test.  The patient is accompanied by his son.  Patient has been seen in this facility previously regarding colon cancer screening and polyp surveillance.  Previous colonoscopy examinations 2004, 2007, 2011, and 2017.  Last examination with 7 tubular adenomas.  Follow-up in 3 years recommended.  He did receive a recall letter.  Patient was feeling poorly the first of this year for which he saw his primary care provider.  Complaints included fatigue, memory problems, and lower extremity edema.  Blood work revealed abnormal liver tests with AST 141, ALT 111, alkaline phosphatase 86, and total bilirubin 1.5.  Previous liver test July 2020 were entirely normal.  Additional blood work revealed anemia with hemoglobin 12.5 and MCV 100.7.  Normal platelets.  Elevated B12.  Depressed folate.  Folate supplement recommended.  At the time of his presentation the patient had been consuming large quantities of alcohol.  They report 1-1/2 gallons of bourbon per week.  He has been abstinent from alcohol for 2 months.  He is feeling better.  He denies a personal or family history of liver disease.  Most recent liver test from July 26, 2019 show AST 50, ALT 43, alkaline phosphatase 77, total bilirubin 0.6.  And abdominal ultrasound was performed August 01, 2019.  Limited to the right upper quadrant.  The liver revealed increased echogenicity but was otherwise normal including Doppler flow.  He has not been vaccinated against Covid"  Patient presents today for routine follow-up.  He is not immune to hepatitis A or B.  Twinrix vaccination series previously offered but not completed.  He continues to  abstain from all alcohol.  Previous studies also reveal that he is negative for hepatitis C and had negative iron studies.  He has not had blood work since his last visit.  He does have a history of GERD for which he takes pantoprazole.  Only other GI complaint is belching.  No dysphagia.  Since his last visit he has been vaccinated against Covid    REVIEW OF SYSTEMS:  All non-GI ROS negative unless otherwise stated in the HPI except for arthritis  Past Medical History:  Diagnosis Date  . Anemia   . Arthritis    hands  . Blood transfusion without reported diagnosis    for stomach ulcers  . Depression   . Hypertension   . Kidney stone   . Multiple duodenal ulcers     Past Surgical History:  Procedure Laterality Date  . ABDOMINAL SURGERY     ulcers  . COLONOSCOPY    . LITHOTRIPSY      Social History Tyrone [name]  reports that he quit smoking about 39 years ago. He quit smokeless tobacco use about 12 years ago.  His smokeless tobacco use included chew. He reports previous alcohol use. He reports that he does not use drugs.  family history is not on file.  No Known Allergies     PHYSICAL EXAMINATION: Vital signs: BP 130/70 (BP Location: Left Arm, Patient Position: Sitting, Cuff Size: Normal)   Pulse 76   Ht 5\' 7"  (1.702 m) Comment: height measured without shoes  Wt 200 lb (90.7 kg)   BMI 31.32 kg/m  Constitutional: generally well-appearing, no acute distress Psychiatric: alert and oriented x3, cooperative Eyes: extraocular movements intact, anicteric, conjunctiva pink Mouth: oral pharynx moist, no lesions Neck: supple no lymphadenopathy Cardiovascular: heart regular rate and rhythm, no murmur Lungs: clear to auscultation bilaterally Abdomen: soft, nontender, nondistended, no obvious ascites, no peritoneal signs, normal bowel sounds, no organomegaly Rectal:  Omitted Extremities: no clubbing, cyanosis, or lower extremity edema bilaterally Skin: no lesions on  visible extremities Neuro: No focal deficits. No asterixis.    ASSESSMENT:  1.  Alcoholic liver disease with previous alcoholic hepatitis.  He has improved clinically and biochemically.  He remains abstinent from alcohol. 2.  History of multiple adenomatous colon polyps.  Last surveillance colonoscopy May 2001.  Follow-up in 3 years   PLAN:  1.  Blood work today including CBC, comprehensive metabolic panel, PT/INR 2.  Arrange Twinrix vaccination series.  The patient is agreeable 3.  Continue avoidance of all alcohol 4.  Routine office follow-up 1 year 5.  Surveillance colonoscopy around 2026  ADDENDUM: Patient's blood work has returned.  Liver tests remain normal.  Prothrombin time normal as are platelets.  Hemoglobin 11.3.  These results have been shared with the patient.

## 2020-02-16 ENCOUNTER — Telehealth: Payer: Self-pay

## 2020-02-16 NOTE — Progress Notes (Unsigned)
    Chronic Care Management Pharmacy Assistant   Name: Tyrone [name]  MRN: [id number] DOB: [birth year]  Reason for Encounter: Medication Review/Initial Questions for Pharmacist visit on 02-20-20 at 2 pm.  Patient Questions:  1.  Have you seen any other providers since your last visit? {CHL THN UPSTREAM YES/NO:24149}  2.  Any changes in your medicines or health? {CHL THN UPSTREAM YES/NO:24149}   Tyrone [name],  74 y.o. , male presents for their Initial CCM visit with the clinical pharmacist via telephone.  Have you seen any other providers since your last visit? **{YES NO:22349} Any changes in your medications or health? {YES NO:22349} Any side effects from any medications? {YES NO:22349} Do you have an symptoms or problems not managed by your medications? {YES NO:22349} Any concerns about your health right now? {YES NO:22349} Has your provider asked that you check blood pressure, blood sugar, or follow special diet at home? {YES NO:22349} Do you get any type of exercise on a regular basis? {YES NO:22349} Can you think of a goal you would like to reach for your health? *** Do you have any problems getting your medications? {YES NO:22349} Is there anything that you would like to discuss during the appointment? ***  Please bring medications and supplements to appointment   PCP : Vivi Barrack, MD  Allergies:  No Known Allergies  Medications: Outpatient Encounter Medications as of 02/16/2020  Medication Sig Note  . allopurinol (ZYLOPRIM) 300 MG tablet Take 1 tablet by mouth daily. 05/13/2014: .   Marland Kitchen amLODipine (NORVASC) 10 MG tablet Take 1 tablet (10 mg total) by mouth daily.   . citalopram (CELEXA) 40 MG tablet Take 40 mg by mouth daily.   . Cyanocobalamin (B-12) 5000 MCG CAPS Take by mouth 2 (two) times daily.   . cyclobenzaprine (FLEXERIL) 10 MG tablet Take 1 tablet (10 mg total) by mouth 3 (three) times daily as needed for muscle spasms.   . folic acid (FOLVITE) 1 MG  tablet Take 1 mg by mouth daily.   Marland Kitchen gabapentin (NEURONTIN) 100 MG capsule TAKE TWO CAPSULES BY MOUTH AT BEDTIME   . pantoprazole (PROTONIX) 40 MG tablet TAKE 1 TABLET BY MOUTH TWICE DAILY   . ramipril (ALTACE) 10 MG capsule Take 10 mg by mouth 2 (two) times daily.    No facility-administered encounter medications on file as of 02/16/2020.    Current Diagnosis: Patient Active Problem List   Diagnosis Date Noted  . Steatohepatitis 10/04/2019  . Subclinical hyperthyroidism 09/14/2019  . Essential hypertension 07/26/2019  . GERD (gastroesophageal reflux disease) 06/06/2019  . Chronic left lumbar radiculopathy 01/10/2019  . Gout 12/03/2018  . Left knee pain 12/03/2018  . Senile purpura ([place]) 12/03/2018  . B12 deficiency 07/20/2018  . Abnormal TSH 07/20/2018  . Fatigue 07/20/2018  . Left foot drop 05/13/2018  . Alcohol abuse 05/13/2018    Georgiana Shore ,[facility] Clinical Pharmacist Assistant [phone]  Follow-Up:  Pharmacist Review

## 2020-02-20 ENCOUNTER — Telehealth: Payer: Medicare Other

## 2020-02-20 ENCOUNTER — Telehealth: Payer: Self-pay

## 2020-02-20 NOTE — Progress Notes (Signed)
Patients step daughter [name] called and cancelled patients appointment 02-20-20 at 2 pm . Per [name] will call back to reschedule at a later time  .

## 2020-02-29 ENCOUNTER — Telehealth: Payer: Self-pay | Admitting: Family Medicine

## 2020-02-29 ENCOUNTER — Other Ambulatory Visit: Payer: Self-pay | Admitting: *Deleted

## 2020-02-29 DIAGNOSIS — I10 Essential (primary) hypertension: Secondary | ICD-10-CM

## 2020-02-29 NOTE — Chronic Care Management (AMB) (Signed)
  Chronic Care Management   Note  02/29/2020 Name: Tyrone [name] MRN: [id number] DOB: August 14, 1945  Tyrone [name] is a 74 y.o. year old male who is a primary care patient of Vivi Barrack, MD. I reached out to [name] by phone today in response to a referral sent by Tyrone [name]'s PCP, Vivi Barrack, MD.   Tyrone [name] was given information about Chronic Care Management services today including:  1. CCM service includes personalized support from designated clinical staff supervised by his physician, including individualized plan of care and coordination with other care providers 2. 24/7 contact phone numbers for assistance for urgent and routine care needs. 3. Service will only be billed when office clinical staff spend 20 minutes or more in a month to coordinate care. 4. Only one practitioner may furnish and bill the service in a calendar month. 5. The patient may stop CCM services at any time (effective at the end of the month) by phone call to the office staff.   Patient did not agree to enrollment in care management services and does not wish to consider at this time.  Follow up plan:   Lauretta Grill Upstream Scheduler

## 2020-02-29 NOTE — Progress Notes (Signed)
  Chronic Care Management   Note  02/29/2020 Name: Tyrone [name] MRN: [id number] DOB: 1945/08/21  Tyrone [name] is a 74 y.o. year old male who is a primary care patient of Vivi Barrack, MD. I reached out to [name] by phone today in response to a referral sent by [name]'s PCP, Vivi Barrack, MD.   [name] was given information about Chronic Care Management services today including:  1. CCM service includes personalized support from designated clinical staff supervised by his physician, including individualized plan of care and coordination with other care providers 2. 24/7 contact phone numbers for assistance for urgent and routine care needs. 3. Service will only be billed when office clinical staff spend 20 minutes or more in a month to coordinate care. 4. Only one practitioner may furnish and bill the service in a calendar month. 5. The patient may stop CCM services at any time (effective at the end of the month) by phone call to the office staff.   Patient did not agree to enrollment in care management services and does not wish to consider at this time.  Follow up plan:   Lauretta Grill Upstream Scheduler

## 2020-03-01 ENCOUNTER — Other Ambulatory Visit: Payer: Self-pay | Admitting: Family Medicine

## 2020-03-26 ENCOUNTER — Other Ambulatory Visit: Payer: Self-pay | Admitting: Family Medicine

## 2020-03-26 DIAGNOSIS — L298 Other pruritus: Secondary | ICD-10-CM | POA: Diagnosis not present

## 2020-03-26 DIAGNOSIS — L281 Prurigo nodularis: Secondary | ICD-10-CM | POA: Diagnosis not present

## 2020-03-30 ENCOUNTER — Encounter: Payer: Self-pay | Admitting: Family Medicine

## 2020-04-05 ENCOUNTER — Other Ambulatory Visit: Payer: Self-pay

## 2020-04-05 ENCOUNTER — Ambulatory Visit (INDEPENDENT_AMBULATORY_CARE_PROVIDER_SITE_OTHER): Payer: Medicare Other | Admitting: Family Medicine

## 2020-04-05 ENCOUNTER — Encounter: Payer: Self-pay | Admitting: Family Medicine

## 2020-04-05 VITALS — BP 149/71 | HR 81 | Temp 98.2°F | Ht 67.0 in | Wt 209.4 lb

## 2020-04-05 DIAGNOSIS — M109 Gout, unspecified: Secondary | ICD-10-CM

## 2020-04-05 DIAGNOSIS — D692 Other nonthrombocytopenic purpura: Secondary | ICD-10-CM | POA: Diagnosis not present

## 2020-04-05 DIAGNOSIS — Z23 Encounter for immunization: Secondary | ICD-10-CM | POA: Diagnosis not present

## 2020-04-05 DIAGNOSIS — M5416 Radiculopathy, lumbar region: Secondary | ICD-10-CM

## 2020-04-05 DIAGNOSIS — Z13220 Encounter for screening for lipoid disorders: Secondary | ICD-10-CM | POA: Diagnosis not present

## 2020-04-05 DIAGNOSIS — Z0000 Encounter for general adult medical examination without abnormal findings: Secondary | ICD-10-CM | POA: Diagnosis not present

## 2020-04-05 DIAGNOSIS — E0590 Thyrotoxicosis, unspecified without thyrotoxic crisis or storm: Secondary | ICD-10-CM

## 2020-04-05 DIAGNOSIS — I10 Essential (primary) hypertension: Secondary | ICD-10-CM | POA: Diagnosis not present

## 2020-04-05 DIAGNOSIS — E785 Hyperlipidemia, unspecified: Secondary | ICD-10-CM

## 2020-04-05 DIAGNOSIS — R739 Hyperglycemia, unspecified: Secondary | ICD-10-CM | POA: Diagnosis not present

## 2020-04-05 MED ORDER — LOSARTAN POTASSIUM 100 MG PO TABS: 100.0000 mg | ORAL_TABLET | Freq: Every day | ORAL | 3 refills | Status: DC

## 2020-04-05 NOTE — Progress Notes (Signed)
Chief Complaint:  Tyrone [name] is a 74 y.o. male who presents today for a subsequent Medicare Annual Wellness Visit and to discuss management of his chronic medical problems.  Assessment/Plan:  New/Acute Problems: Leg Swelling No red flags.  Will check labs today including CBC, CMET, TSH.  Likely side effect of amlodipine.  We will switch to losartan-see below.  He will let me know if not improving.  Chronic Problems Addressed Today: Hyperglycemia Check A1c.  Subclinical hyperthyroidism Managed by endocrinology.  Will check TSH.  Essential hypertension At goal per J[facility] eight.  Having lower extremity edema.  Possibly side effect of amlodipine.  We will stop today and start losartan 100 mg daily.  Check CBC, CMET tsh.  Chronic left-sided lumbar radiculopathy Continue management per sports medicine.  Symptoms are unfortunately not well controlled.  Will follow up back up with them soon.  Senile purpura (HCC) Due to NSAIDs.  Gout No recent flares.  Continue allopurinol 300 mg daily.  Preventative Healthcare Flu vaccine given today.  Colonoscopy earlier this year.  Due for next colonoscopy in 2026.  Up-to-date on Covid vaccine.  No longer needs PSA screening due to age.  During the course of the visit the patient was educated and counseled about appropriate screening and preventive services including:        Fall prevention   Nutrition Physical Activity Weight Management Cognition    Subjective:  HPI:  Health Risk Assessment: Patient considers his overall health to be good. He has no difficulty performing the following: . Preparing food and eating . Bathing  . Getting dressed . Using the toilet . Shopping . Managing Finances . Moving around from place to place  He has not had any falls within the past year.   Depression screen [facility] 2/9 04/05/2020  Decreased Interest 0  Down, Depressed, Hopeless 0  PHQ - 2 Score 0  Altered sleeping 0  Tired, decreased  energy 0  Change in appetite 0  Feeling bad or failure about yourself  0  Trouble concentrating 0  Moving slowly or fidgety/restless 0  Suicidal thoughts 0  PHQ-9 Score 0  Difficult doing work/chores Not difficult at all    Lifestyle Factors: Diet: Balanced. Tries to get plenty of fruits and vegetables.  Exercise: Active at work.   Patient Care Team: Vivi Barrack, MD as PCP - General (Family Medicine) Madelin Rear, [facility] as Pharmacist (Pharmacist)   He has had bilateral leg swelling for the past month or so.  Stable.  No further chest pain or shortness of breath.  No obvious aggravating factors.  No specific treatments tried.  ROS: Per HPI, otherwise a complete review of systems was negative.   PMH:  The following were reviewed and entered/updated in epic: Past Medical History:  Diagnosis Date  . Anemia   . Arthritis    hands  . Blood transfusion without reported diagnosis    for stomach ulcers  . Depression   . Hypertension   . Kidney stone   . Multiple duodenal ulcers    Patient Active Problem List   Diagnosis Date Noted  . Hyperglycemia 04/05/2020  . Steatohepatitis 10/04/2019  . Subclinical hyperthyroidism 09/14/2019  . Essential hypertension 07/26/2019  . GERD (gastroesophageal reflux disease) 06/06/2019  . Chronic left-sided lumbar radiculopathy 01/10/2019  . Gout 12/03/2018  . Left knee pain 12/03/2018  . Senile purpura ([place]) 12/03/2018  . B12 deficiency 07/20/2018  . Fatigue 07/20/2018  . Left foot drop 05/13/2018  .  Alcohol abuse 05/13/2018   Past Surgical History:  Procedure Laterality Date  . ABDOMINAL SURGERY     ulcers  . COLONOSCOPY    . LITHOTRIPSY      Family History  Problem Relation Age of Onset  . Colon cancer Neg Hx   . Esophageal cancer Neg Hx   . Rectal cancer Neg Hx   . Stomach cancer Neg Hx     Medications- reviewed and updated Current Outpatient Medications  Medication Sig Dispense Refill  . allopurinol (ZYLOPRIM) 300  MG tablet TAKE 1 TABLET BY MOUTH DAILY 90 tablet 4  . citalopram (CELEXA) 40 MG tablet TAKE 1 TABLET BY MOUTH EVERY DAY 30 tablet 2  . Cyanocobalamin (B-12) 5000 MCG CAPS Take by mouth 2 (two) times daily.    . cyclobenzaprine (FLEXERIL) 10 MG tablet Take 1 tablet (10 mg total) by mouth 3 (three) times daily as needed for muscle spasms. 30 tablet 0  . folic acid (FOLVITE) 1 MG tablet Take 1 mg by mouth daily.    Marland Kitchen gabapentin (NEURONTIN) 100 MG capsule TAKE TWO CAPSULES BY MOUTH AT BEDTIME 60 capsule 3  . pantoprazole (PROTONIX) 40 MG tablet TAKE 1 TABLET BY MOUTH TWICE DAILY 60 tablet 3  . losartan (COZAAR) 100 MG tablet Take 1 tablet (100 mg total) by mouth daily. 90 tablet 3   No current facility-administered medications for this visit.    Allergies-reviewed and updated No Known Allergies  Social History   Socioeconomic History  . Marital status: Married    Spouse name: Not on file  . Number of children: Not on file  . Years of education: Not on file  . Highest education level: Not on file  Occupational History  . Not on file  Tobacco Use  . Smoking status: Former Smoker    Quit date: 05/30/1980    Years since quitting: 39.8  . Smokeless tobacco: Former Systems developer    Types: [place] date: 05/30/2007  Substance and Sexual Activity  . Alcohol use: Not Currently    Alcohol/week: 0.0 standard drinks    Comment: sober 2.5 months  . Drug use: No  . Sexual activity: Not on file  Other Topics Concern  . Not on file  Social History Narrative  . Not on file   Social Determinants of Health   Financial Resource Strain:   . Difficulty of Paying Living Expenses: Not on file  Food Insecurity:   . Worried About Charity fundraiser in the Last Year: Not on file  . Ran Out of Food in the Last Year: Not on file  Transportation Needs:   . Lack of Transportation (Medical): Not on file  . Lack of Transportation (Non-Medical): Not on file  Physical Activity:   . Days of Exercise per  Week: Not on file  . Minutes of Exercise per Session: Not on file  Stress:   . Feeling of Stress : Not on file  Social Connections:   . Frequency of Communication with Friends and Family: Not on file  . Frequency of Social Gatherings with Friends and Family: Not on file  . Attends Religious Services: Not on file  . Active Member of Clubs or Organizations: Not on file  . Attends Archivist Meetings: Not on file  . Marital Status: Not on file        Objective/Observations  Physical Exam: BP (!) 149/71   Pulse 81   Temp 98.2 F (36.8 C) (Temporal)  Ht 5\' 7"  (1.702 m)   Wt 209 lb 6.4 oz (95 kg)   SpO2 98%   BMI 32.80 kg/m  Gen: NAD, resting comfortably HEENT: TMs normal bilaterally. OP clear. No thyromegaly noted.  CV: RRR with no murmurs appreciated Pulm: NWOB, CTAB with no crackles, wheezes, or rhonchi GI: Normal bowel sounds present. Soft, Nontender, Nondistended. MSK: Legs with 1+ pitting edema bilaterally. Skin: warm, dry Neuro: CN2-12 grossly intact. Strength 5/5 in upper and lower extremities. Reflexes symmetric and intact bilaterally. Normal minicog with 3/3 delayed word recall. Psych: Normal affect and thought content      Dashiell Franchino M. [name], MD 04/05/2020 9:48 AM

## 2020-04-05 NOTE — Assessment & Plan Note (Signed)
Managed by endocrinology.  Will check TSH.

## 2020-04-05 NOTE — Patient Instructions (Signed)
It was very nice to see you today!  We will check blood work today.  Stop the amlodipine.  We will start losartan.  Keep in on your blood pressure and let me know if persistently 150/90 or higher.  We have your flu vaccine today.  I will see you back in 6 months.  Please come back to see me sooner if needed.  Take care, Dr [name]  Please try these tips to maintain a healthy lifestyle:   Eat at least 3 REAL meals and 1-2 snacks per day.  Aim for no more than 5 hours between eating.  If you eat breakfast, please do so within one hour of getting up.    Each meal should contain half fruits/vegetables, one quarter protein, and one quarter carbs (no bigger than a computer mouse)   Cut down on sweet beverages. This includes juice, soda, and sweet tea.     Drink at least 1 glass of water with each meal and aim for at least 8 glasses per day   Exercise at least 150 minutes every week.   Very nice of you think you have nonspecific if you would like it out there was a few

## 2020-04-05 NOTE — Assessment & Plan Note (Signed)
Due to NSAIDs.

## 2020-04-05 NOTE — Assessment & Plan Note (Signed)
At goal per JNC eight.  Having lower extremity edema.  Possibly side effect of amlodipine.  We will stop today and start losartan 100 mg daily.  Check CBC, CMET tsh.

## 2020-04-05 NOTE — Assessment & Plan Note (Signed)
Continue management per sports medicine.  Symptoms are unfortunately not well controlled.  Will follow up back up with them soon.

## 2020-04-05 NOTE — Assessment & Plan Note (Signed)
No recent flares.  Continue allopurinol 300 mg daily. 

## 2020-04-05 NOTE — Assessment & Plan Note (Signed)
Check A1c. 

## 2020-04-06 LAB — LIPID PANEL
Cholesterol: 152 mg/dL (ref <200)
HDL: 55 mg/dL (ref >=40)
LDL Cholesterol (Calc): 83 mg/dL (calc)
Non-HDL Cholesterol (Calc): 97 mg/dL (calc) (ref <130)
Total CHOL/HDL Ratio: 2.8 (calc) (ref <5.0)
Triglycerides: 65 mg/dL (ref <150)

## 2020-04-06 LAB — CBC
HCT: 29.6 % — ABNORMAL LOW (ref 38.5–50.0)
Hemoglobin: 9.3 g/dL — ABNORMAL LOW (ref 13.2–17.1)
MCH: 26.9 pg — ABNORMAL LOW (ref 27.0–33.0)
MCHC: 31.4 g/dL — ABNORMAL LOW (ref 32.0–36.0)
MCV: 85.5 fL (ref 80.0–100.0)
MPV: 10.6 fL (ref 7.5–12.5)
Platelets: 259 10*3/uL (ref 140–400)
RBC: 3.46 10*6/uL — ABNORMAL LOW (ref 4.20–5.80)
RDW: 13.5 % (ref 11.0–15.0)
WBC: 6.1 10*3/uL (ref 3.8–10.8)

## 2020-04-06 LAB — COMPREHENSIVE METABOLIC PANEL
AG Ratio: 1.8 (calc) (ref 1.0–2.5)
ALT: 16 U/L (ref 9–46)
AST: 19 U/L (ref 10–35)
Albumin: 4.2 g/dL (ref 3.6–5.1)
Alkaline phosphatase (APISO): 89 U/L (ref 35–144)
BUN: 17 mg/dL (ref 7–25)
CO2: 26 mmol/L (ref 20–32)
Calcium: 9.5 mg/dL (ref 8.6–10.3)
Chloride: 105 mmol/L (ref 98–110)
Creat: 1.13 mg/dL (ref 0.70–1.18)
Globulin: 2.4 g/dL (calc) (ref 1.9–3.7)
Glucose, Bld: 113 mg/dL — ABNORMAL HIGH (ref 65–99)
Potassium: 5 mmol/L (ref 3.5–5.3)
Sodium: 139 mmol/L (ref 135–146)
Total Bilirubin: 0.6 mg/dL (ref 0.2–1.2)
Total Protein: 6.6 g/dL (ref 6.1–8.1)

## 2020-04-06 LAB — HEMOGLOBIN A1C
Hgb A1c MFr Bld: 6.3 % of total Hgb — ABNORMAL HIGH (ref <5.7)
Mean Plasma Glucose: 134 (calc)
eAG (mmol/L): 7.4 (calc)

## 2020-04-06 LAB — TSH: TSH: 0.38 mIU/L — ABNORMAL LOW (ref 0.40–4.50)

## 2020-04-09 NOTE — Progress Notes (Signed)
[place] [address] Phone: [phone] Subjective:   Tyrone [name], am serving as a scribe for Dr. Hulan Saas. This visit occurred during the SARS-CoV-2 public health emergency.  Safety protocols were in place, including screening questions prior to the visit, additional usage of staff PPE, and extensive cleaning of exam room while observing appropriate contact time as indicated for disinfecting solutions.   I'm seeing this patient by the request  of:  Vivi Barrack, MD  CC: Low back pain follow-up  CVE:[id number]   01/10/2020 Patient is doing much better with conservative therapy at this time.  Discussed with patient icing regimen and home exercise, increase activity slowly over the course the next several weeks.  Follow-up with me again in 4 to 8 weeks  Update 04/10/2020 Tyrone [name] is a 74 y.o. male coming in with complaint of low back pain. Patient states that his back pain is getting worse. Pain radiates down left leg. Taking gabapentin at night.  Patient states that when he walks he can only walk approximately 100 yards before he starts having increasing discomfort again.  Patient states that he sits for some time seems to get better.  Starting to notice that he is having more weakness of the foot again.  Was making progress previously.  Knee pain is also increasing. Is using custom brace for support.    Lumbar xray 2020 IMPRESSION: 1. Mild thoracolumbar spine scoliosis. Diffuse multilevel severe degenerative change. Stable mild anterolisthesis L4 on L5. Stable mild T11 compression fracture. [name] acute bony abnormality.  2. Prominent calcific density noted over the left kidney consistent with nephrolithiasis.  3.  Aortoiliac atherosclerotic vascular disease.     Past Medical History:  Diagnosis Date  . Anemia   . Arthritis    hands  . Blood transfusion without reported diagnosis    for stomach  ulcers  . Depression   . Hypertension   . Kidney stone   . Multiple duodenal ulcers    Past Surgical History:  Procedure Laterality Date  . ABDOMINAL SURGERY     ulcers  . COLONOSCOPY    . LITHOTRIPSY     Social History   Socioeconomic History  . Marital status: Married    Spouse name: Not on file  . Number of children: Not on file  . Years of education: Not on file  . Highest education level: Not on file  Occupational History  . Not on file  Tobacco Use  . Smoking status: Former Smoker    Quit date: 05/30/1980    Years since quitting: 39.8  . Smokeless tobacco: Former Systems developer    Types: [place] date: 05/30/2007  Substance and Sexual Activity  . Alcohol use: Not Currently    Alcohol/week: 0.0 standard drinks    Comment: sober 2.5 months  . Drug use: [name]  . Sexual activity: Not on file  Other Topics Concern  . Not on file  Social History Narrative  . Not on file   Social Determinants of Health   Financial Resource Strain:   . Difficulty of Paying Living Expenses: Not on file  Food Insecurity:   . Worried About Charity fundraiser in the Last Year: Not on file  . Ran Out of Food in the Last Year: Not on file  Transportation Needs:   . Lack of Transportation (Medical): Not on file  . Lack of Transportation (Non-Medical): Not on file  Physical Activity:   . Days of Exercise per Week: Not on file  . Minutes of Exercise per Session: Not on file  Stress:   . Feeling of Stress : Not on file  Social Connections:   . Frequency of Communication with Friends and Family: Not on file  . Frequency of Social Gatherings with Friends and Family: Not on file  . Attends Religious Services: Not on file  . Active Member of Clubs or Organizations: Not on file  . Attends Archivist Meetings: Not on file  . Marital Status: Not on file   [name] Known Allergies Family History  Problem Relation Age of Onset  . Colon cancer Neg Hx   . Esophageal cancer Neg Hx   . Rectal  cancer Neg Hx   . Stomach cancer Neg Hx      Current Outpatient Medications (Cardiovascular):  .  losartan (COZAAR) 100 MG tablet, Take 1 tablet (100 mg total) by mouth daily.   Current Outpatient Medications (Analgesics):  .  allopurinol (ZYLOPRIM) 300 MG tablet, TAKE 1 TABLET BY MOUTH DAILY  Current Outpatient Medications (Hematological):  Marland Kitchen  Cyanocobalamin (B-12) 5000 MCG CAPS, Take by mouth 2 (two) times daily. .  folic acid (FOLVITE) 1 MG tablet, Take 1 mg by mouth daily.  Current Outpatient Medications (Other):  .  citalopram (CELEXA) 40 MG tablet, TAKE 1 TABLET BY MOUTH EVERY DAY .  cyclobenzaprine (FLEXERIL) 10 MG tablet, Take 1 tablet (10 mg total) by mouth 3 (three) times daily as needed for muscle spasms. Marland Kitchen  gabapentin (NEURONTIN) 100 MG capsule, TAKE TWO CAPSULES BY MOUTH AT BEDTIME .  pantoprazole (PROTONIX) 40 MG tablet, TAKE 1 TABLET BY MOUTH TWICE DAILY   Reviewed prior external information including notes and imaging from  primary care provider As well as notes that were available from care everywhere and other healthcare systems.  Past medical history, social, surgical and family history all reviewed in electronic medical record.  [name] pertanent information unless stated regarding to the chief complaint.   Review of Systems:  [name] headache, visual changes, nausea, vomiting, diarrhea, constipation, dizziness, abdominal pain, skin rash, fevers, chills, night sweats, weight loss, swollen lymph nodes, body aches, joint swelling, chest pain, shortness of breath, mood changes. POSITIVE muscle aches  Objective  Blood pressure 118/84, pulse 83, height 5\' 7"  (1.702 m), weight 203 lb (92.1 kg), SpO2 98 %.   General: [name] apparent distress alert and oriented x3 mood and affect normal, dressed appropriately.  HEENT: Pupils equal, extraocular movements intact  Respiratory: Patient's speak in full sentences and does not appear short of breath  Cardiovascular: Trace lower extremity  edema, non tender, [name] erythema  Back exam does show degenerative scoliosis noted.  Tightness noted in the paraspinal musculature of the lumbar spine left greater than right.  Positive straight leg test at 25 degrees of forward flexion.  Patient does have atrophy of the thigh on the left side compared to the contralateral side.  Patient does have some mild weakness with 4 out of 5 strength with dorsi flexion compared to the contralateral side.  1+ dorsalis pulse noted on the left compared to the right and 1+ Achilles tendon DTR compared to the contralateral side    Impression and Recommendations:     The above documentation has been reviewed and is accurate and complete Lyndal Pulley, DO

## 2020-04-09 NOTE — Progress Notes (Signed)
Please inform patient of the following:  Blood sugar is borderline.  Recommend starting Metformin 500 mg daily for you lumbar number sent for blood pressure diabetes.  I would like to recheck in about 6 months.  Obviously the blood work is stable.  Tyrone [name]. [name], MD 04/09/2020 1:21 PM

## 2020-04-10 ENCOUNTER — Telehealth: Payer: Self-pay

## 2020-04-10 ENCOUNTER — Other Ambulatory Visit: Payer: Self-pay

## 2020-04-10 ENCOUNTER — Ambulatory Visit (INDEPENDENT_AMBULATORY_CARE_PROVIDER_SITE_OTHER): Payer: Medicare Other | Admitting: Family Medicine

## 2020-04-10 ENCOUNTER — Ambulatory Visit (INDEPENDENT_AMBULATORY_CARE_PROVIDER_SITE_OTHER): Payer: Medicare Other

## 2020-04-10 ENCOUNTER — Encounter: Payer: Self-pay | Admitting: Family Medicine

## 2020-04-10 ENCOUNTER — Other Ambulatory Visit: Payer: Self-pay | Admitting: Family Medicine

## 2020-04-10 DIAGNOSIS — M545 Low back pain, unspecified: Secondary | ICD-10-CM

## 2020-04-10 DIAGNOSIS — M5416 Radiculopathy, lumbar region: Secondary | ICD-10-CM

## 2020-04-10 DIAGNOSIS — Z77018 Contact with and (suspected) exposure to other hazardous metals: Secondary | ICD-10-CM

## 2020-04-10 IMAGING — DX DG LUMBAR SPINE 2-3V
3 series · 3 of 3 positions shown · non-contrast
Comparison: [DATE]

CLINICAL DATA: Chronic back pain

EXAM:
LUMBAR SPINE - 2-3 VIEW

[l-spine ap]
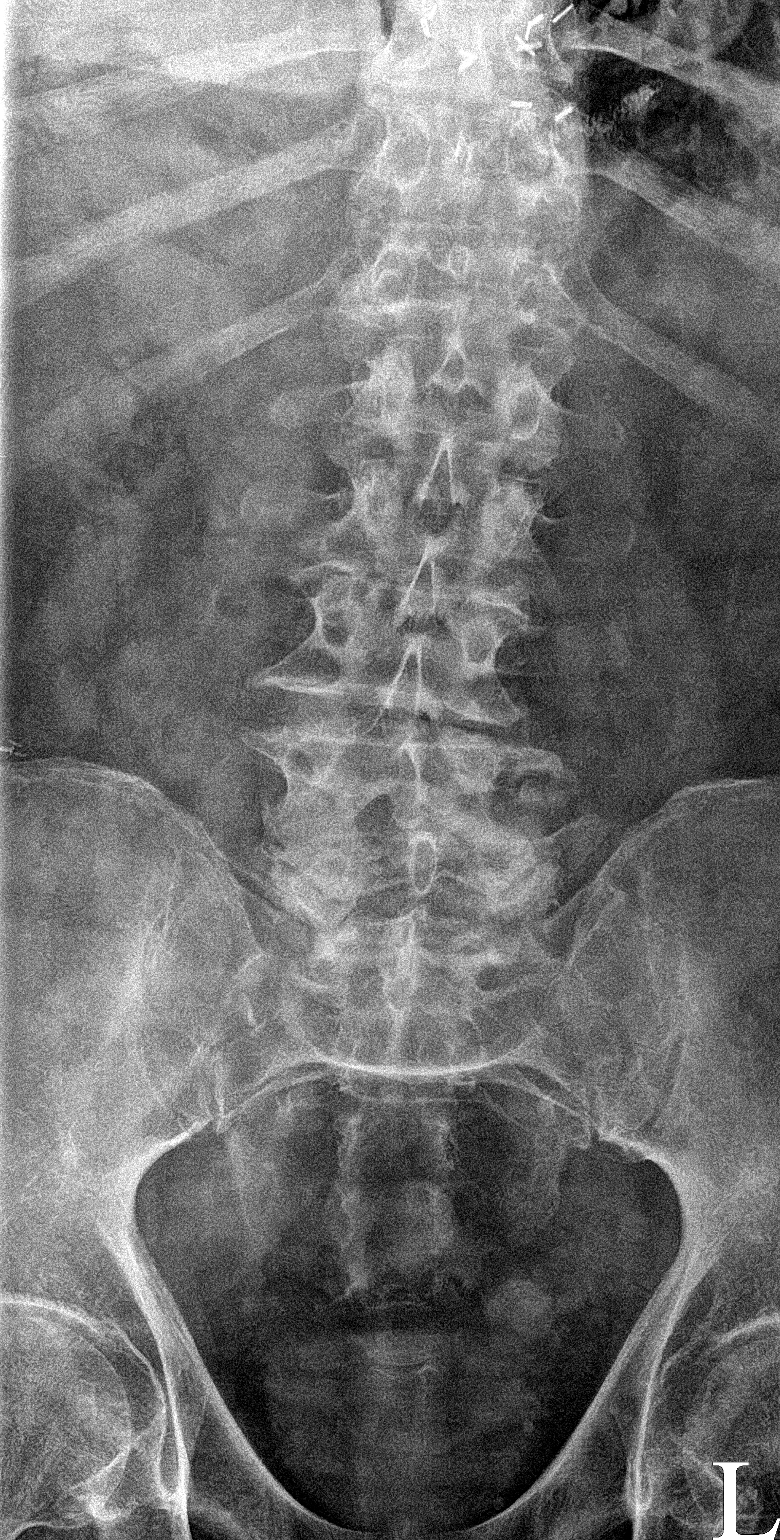

[l-spine lateral (1 of 2)]
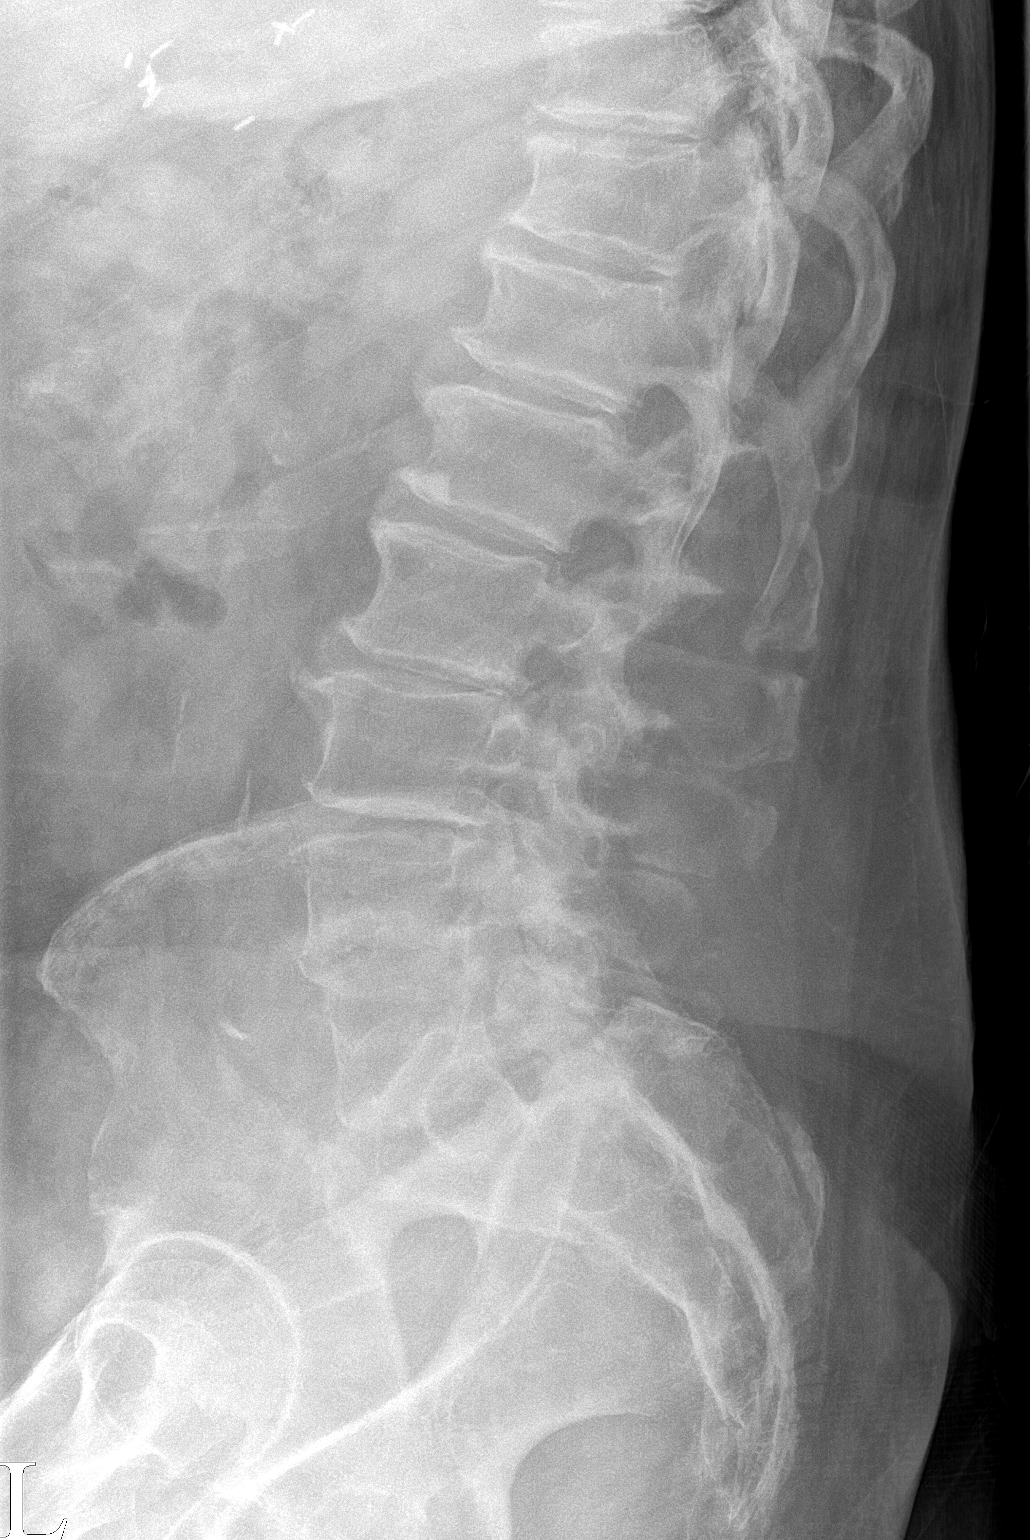

[l-spine lateral (2 of 2)]
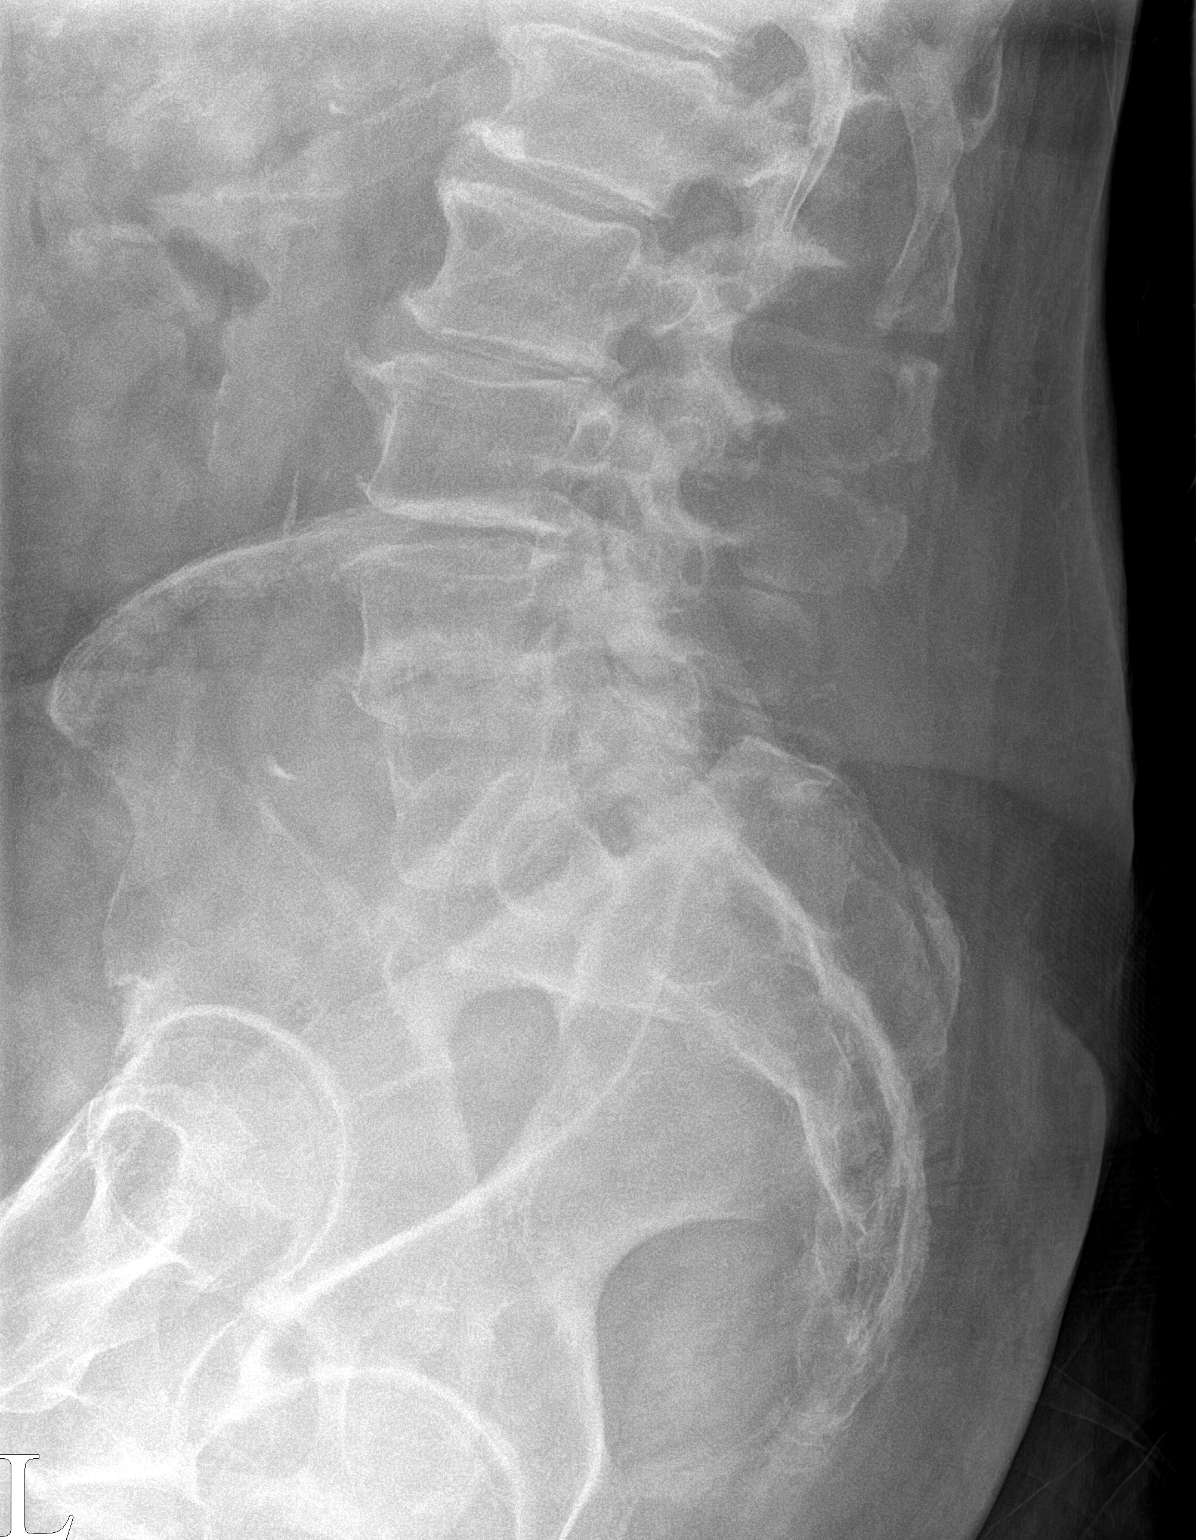

[3 of 3 positions shown; findings below may reference images not displayed]

FINDINGS: Mild scoliosis. Grade 1 anterolisthesis L4 on L5. Moderate severe
diffuse degenerative changes throughout the lumbar spine with
multiple level disc space narrowing and osteophyte. Aortic
atherosclerosis. Facet degenerative changes of the lower lumbar
spine.
IMPRESSION: Scoliosis and degenerative changes of the spine. No acute osseous
abnormality.

## 2020-04-10 MED ORDER — METFORMIN HCL 500 MG PO TABS: 500.0000 mg | ORAL_TABLET | Freq: Every day | ORAL | 3 refills | Status: DC

## 2020-04-10 NOTE — Assessment & Plan Note (Signed)
Patient is having worsening symptoms at this time.  Not well controlled.  Patient does have some atrophy noted.Discussed the gabapentin and continue the same dose at the moment.  We could potentially increase the patient is having signs and symptoms more consistent with nerve impingement with possible spinal stenosis.  MRI ordered today for further evaluation and patient would be a candidate for potential epidurals.  Patient will follow up with me after imaging to discuss or we will call him and then follow-up after epidural or nerve root injections if necessary total time reviewing patient's chart and talking to patient 31 minutes today

## 2020-04-10 NOTE — Telephone Encounter (Signed)
Returned pt daughter in law phone call and lab results reviewed.

## 2020-04-10 NOTE — Patient Instructions (Signed)
Xray today MRI [facility] [phone] We will be in touch once we have results

## 2020-04-10 NOTE — Assessment & Plan Note (Signed)
Patient is starting to have some mild increase in weakness again.

## 2020-04-10 NOTE — Telephone Encounter (Signed)
Patients daughter in law returned phone call

## 2020-05-01 ENCOUNTER — Ambulatory Visit
Admission: RE | Admit: 2020-05-01 | Discharge: 2020-05-01 | Disposition: A | Discharge status: Home / Self Care | Payer: Medicare Other | Source: Ambulatory Visit | Attending: Family Medicine | Admitting: Family Medicine

## 2020-05-01 DIAGNOSIS — M545 Low back pain, unspecified: Secondary | ICD-10-CM | POA: Diagnosis not present

## 2020-05-01 DIAGNOSIS — Z135 Encounter for screening for eye and ear disorders: Secondary | ICD-10-CM | POA: Diagnosis not present

## 2020-05-01 DIAGNOSIS — M48061 Spinal stenosis, lumbar region without neurogenic claudication: Secondary | ICD-10-CM | POA: Diagnosis not present

## 2020-05-01 DIAGNOSIS — M5416 Radiculopathy, lumbar region: Secondary | ICD-10-CM

## 2020-05-01 DIAGNOSIS — Z77018 Contact with and (suspected) exposure to other hazardous metals: Secondary | ICD-10-CM

## 2020-05-01 IMAGING — MR MR LUMBAR SPINE W/O CM
5 of 6 series · 31 of 48 positions shown · non-contrast
Comparison: Lumbar radiographs [DATE].

CLINICAL DATA: Low back pain.  Radiation into left leg.

EXAM:
MRI LUMBAR SPINE WITHOUT CONTRAST
TECHNIQUE: Multiplanar, multisequence MR imaging of the lumbar spine was
performed. No intravenous contrast was administered.

[Series 3: T2 · sagittal · 4.0mm · 1.09mm/px · 6 of 18 slices shown (1 of 3)]
[im 1/18]
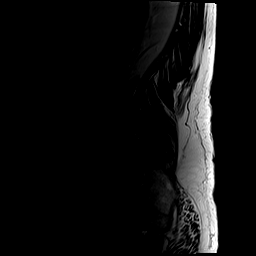
[im 4/18]
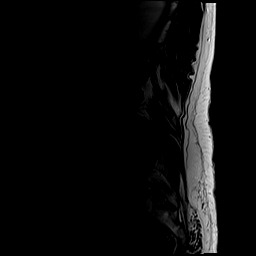
[im 7/18]
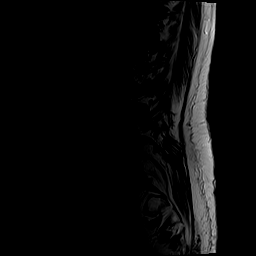
[im 11/18]
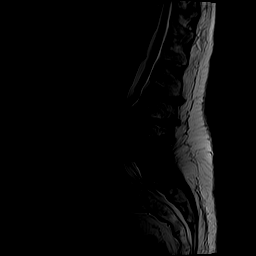
[im 14/18]
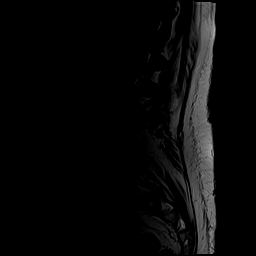
[im 18/18]
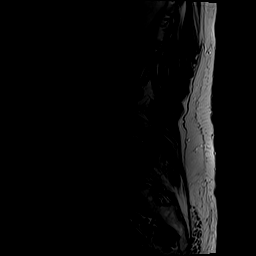

[Series 5: T1 · sagittal · 4.0mm · 1.09mm/px · 6 of 18 slices shown (1 of 2)]
[im 1/18]
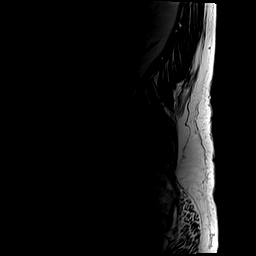
[im 4/18]
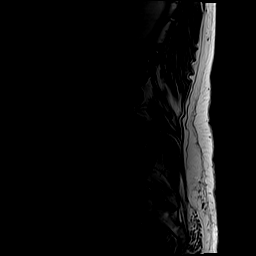
[im 7/18]
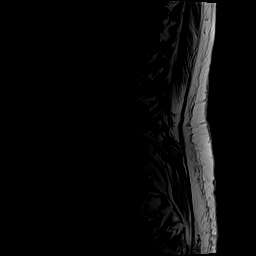
[im 11/18]
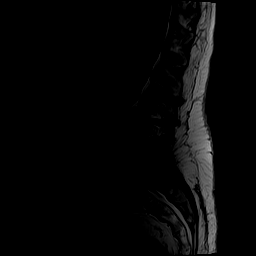
[im 14/18]
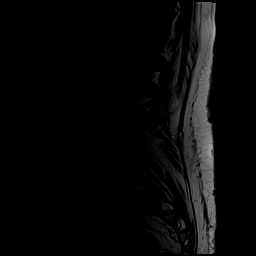
[im 18/18]
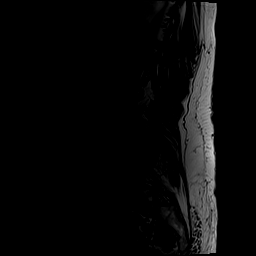

[Series 6: T2 · axial · 4.0mm · 0.39mm/px · z∈[-98,+128]mm · 9 of 37 slices shown (2 of 3)]
[im 1/37]
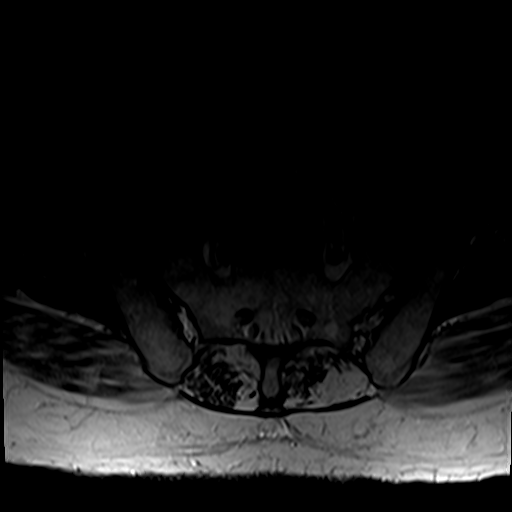
[im 7/37]
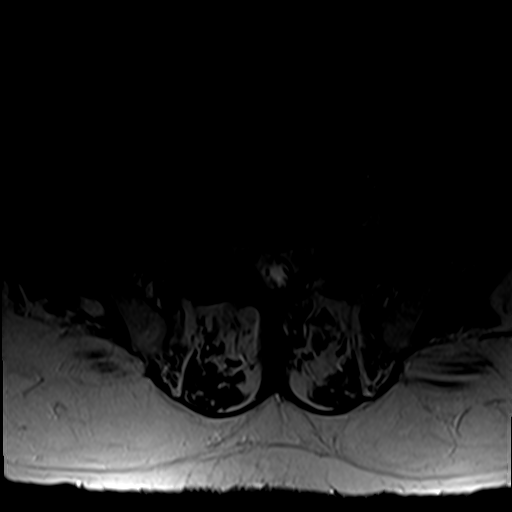
[im 10/37]
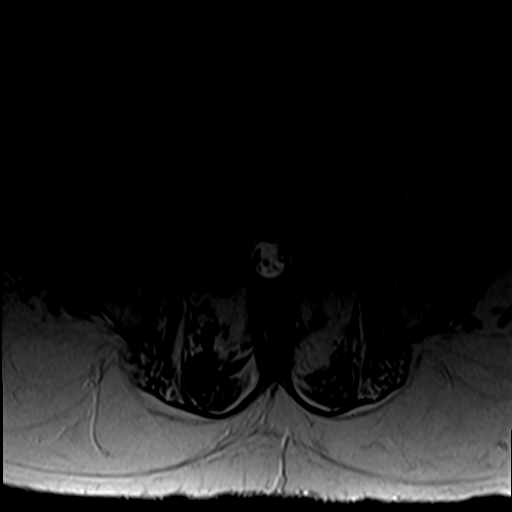
[im 17/37]
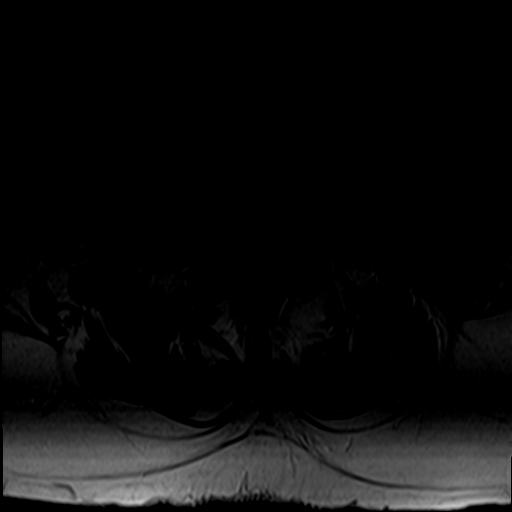
[im 20/37]
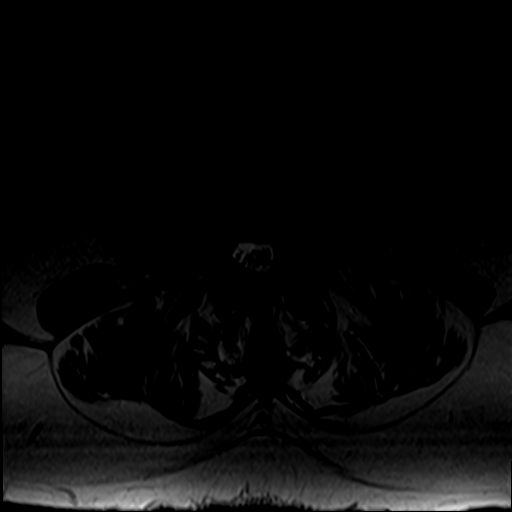
[im 27/37]
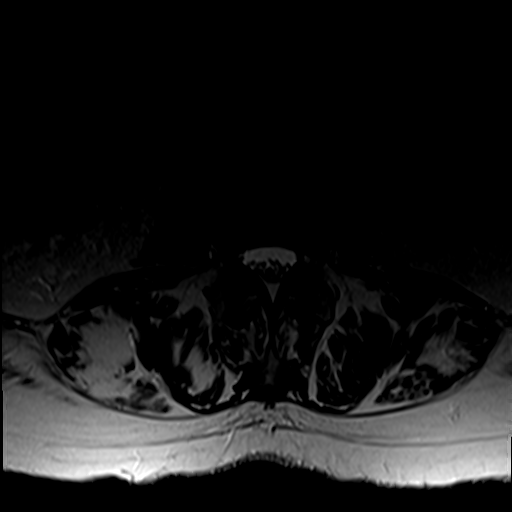
[im 30/37]
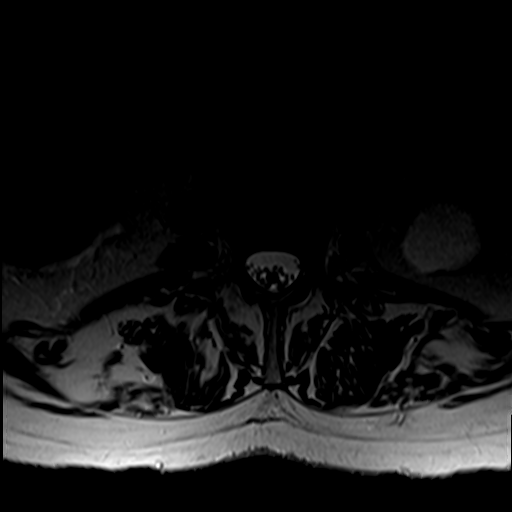
[im 33/37]
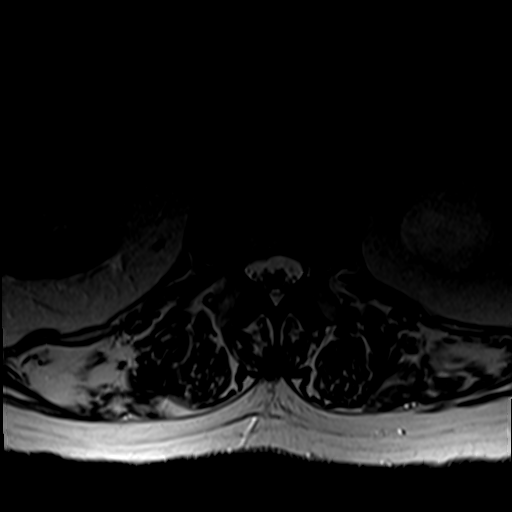
[im 37/37]
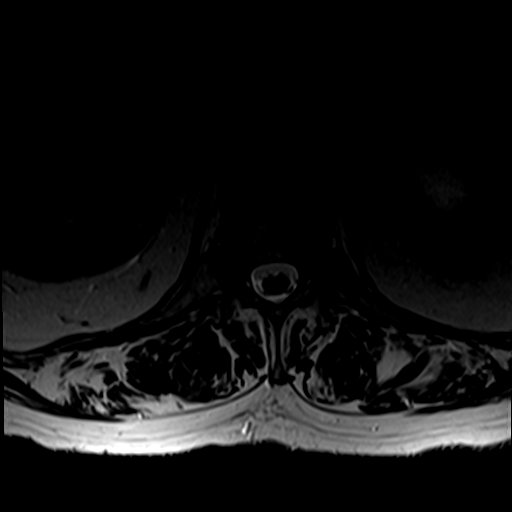

[Series 7: T1 · axial · 4.0mm · 0.39mm/px · z∈[-98,+8]mm · 4 of 37 slices shown (2 of 2)]
[im 1/37]
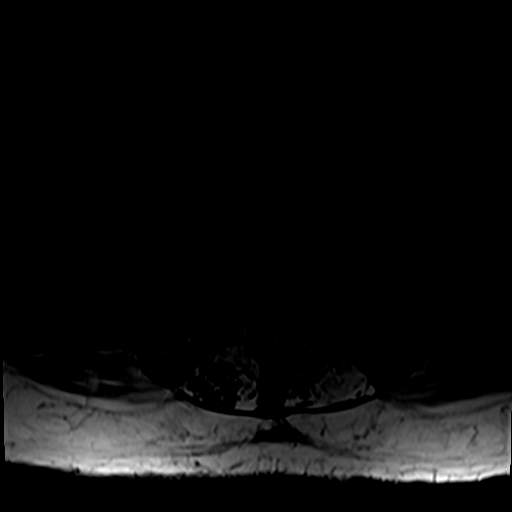
[im 7/37]
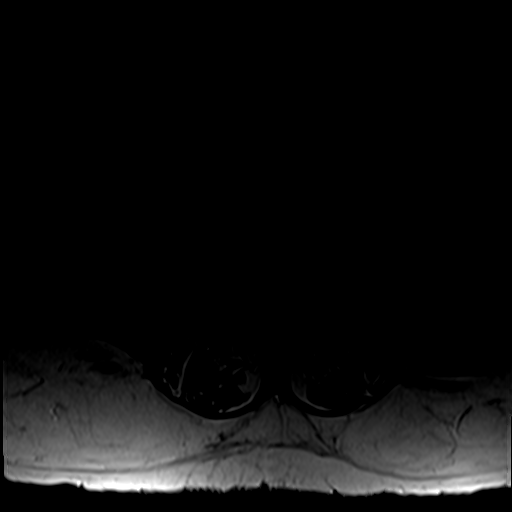
[im 10/37]
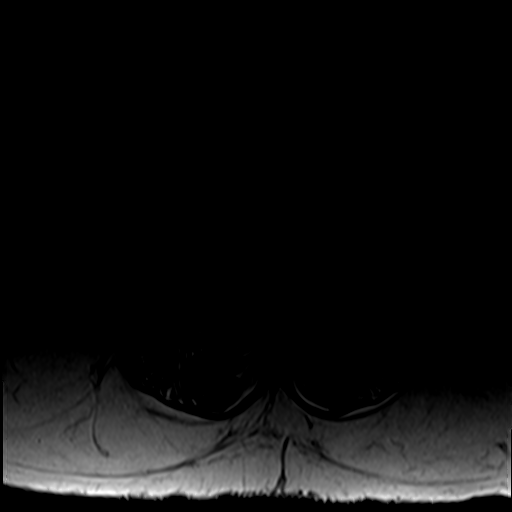
[im 17/37]
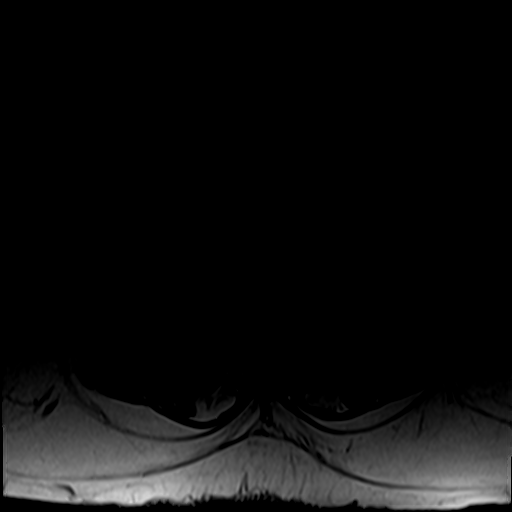

[Series 8: T2 · coronal · 4.0mm · 1.09mm/px · 6 of 19 slices shown (3 of 3)]
[im 1/19]
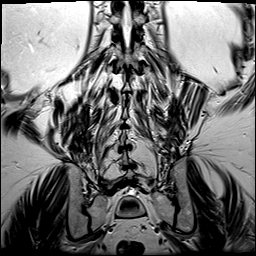
[im 4/19]
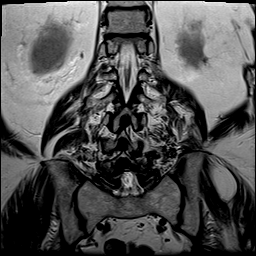
[im 8/19]
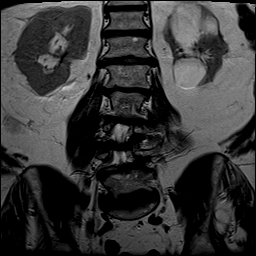
[im 11/19]
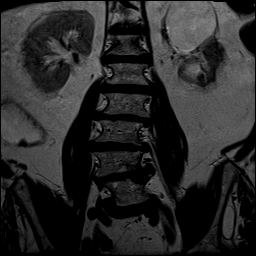
[im 15/19]
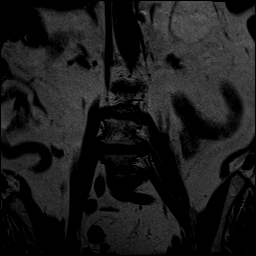
[im 19/19]
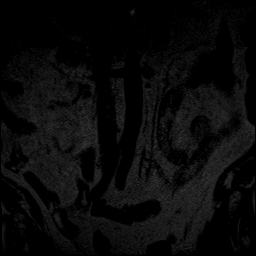

[31 of 48 positions shown; findings below may reference images not displayed]

FINDINGS: Segmentation: The inferior-most fully formed intervertebral disc is
labeled L5-S1.

Alignment: No substantial sagittal subluxation. Dextrocurvature
centered at L3-L4.

Vertebrae: Mild degenerative endplate signal changes at L2-L3,
L3-L4, and L4-L5. Degenerative Schmorl's node involving the inferior
L2 endplate. Otherwise, no focal marrow signal abnormality to
suggest acute fracture, discitis/osteomyelitis, or suspicious bone
lesion. Vertebral body heights are maintained.

Conus medullaris and cauda equina: Conus extends to the superior L2
level. Conus appears normal.

Paraspinal and other soft tissues: Suboptimally and partially
evaluated T2 hyperintense lesions in the left kidney, most likely
cysts.

Disc levels:

T12-L1: Broad disc bulge and mild bilateral facet hypertrophy
without significant canal or foraminal stenosis.

L1-L2: Minimal broad-based disc bulge and mild bilateral facet
hypertrophy without significant canal or foraminal stenosis.

L2-L3: Broad-based disc bulge, ligamentum flavum thickening and
bilateral facet hypertrophy. Moderate to severe canal stenosis.
Moderate left foraminal stenosis. No significant right foraminal
stenosis.

L3-L4: Broad-based disc bulge with left foraminal disc protrusion,
bilateral facet hypertrophy, ligamentum flavum thickening. Severe
canal stenosis. Severe left and moderate right foraminal stenosis.

L4-L5: Left eccentric broad-based disc bulge and bilateral facet
hypertrophy and ligamentum flavum thickening. Moderate to severe
canal stenosis. Severe left foraminal stenosis.

L5-S1: Broad-based disc bulge and bilateral facet hypertrophy.
Moderate right and mild left foraminal stenosis.
IMPRESSION: 1. Severe canal stenosis at L3-L4 and moderate to severe canal
stenosis at L2-L3 and L4-L5.
2. Severe foraminal stenosis on the left at L3-L4 and L4-L5.
3. Moderate stenosis on the right at L3-L4, L4-L5, and L5-S1 and the
left at L2-L3.

## 2020-05-01 IMAGING — CR DG ORBITS FOR FOREIGN BODY
2 series · 2 of 2 positions shown · non-contrast
Comparison: None.

CLINICAL DATA: Metal working/exposure; clearance prior to MRI

EXAM:
ORBITS FOR FOREIGN BODY - 2 VIEW

[w orbit pa (1 of 2)]
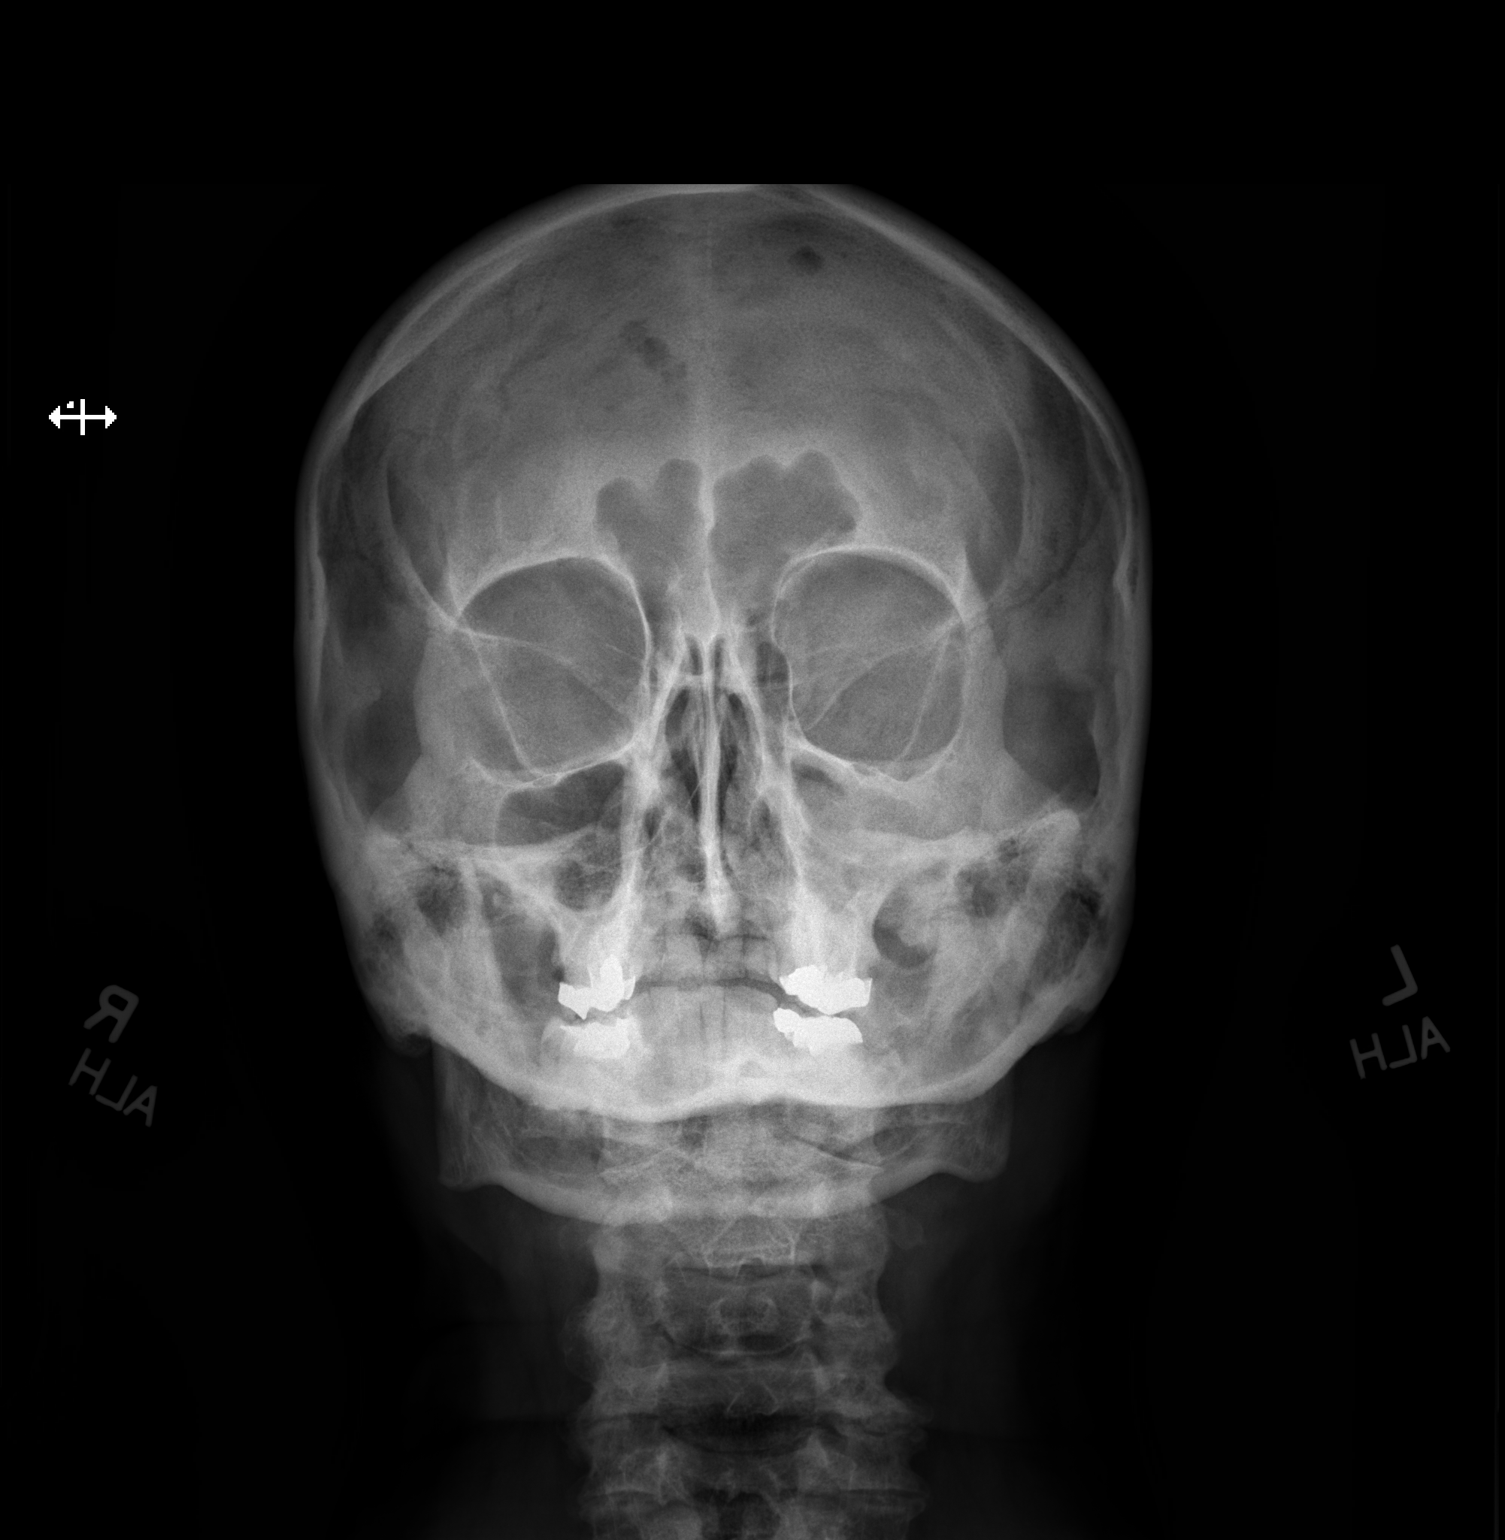

[w orbit pa (2 of 2)]
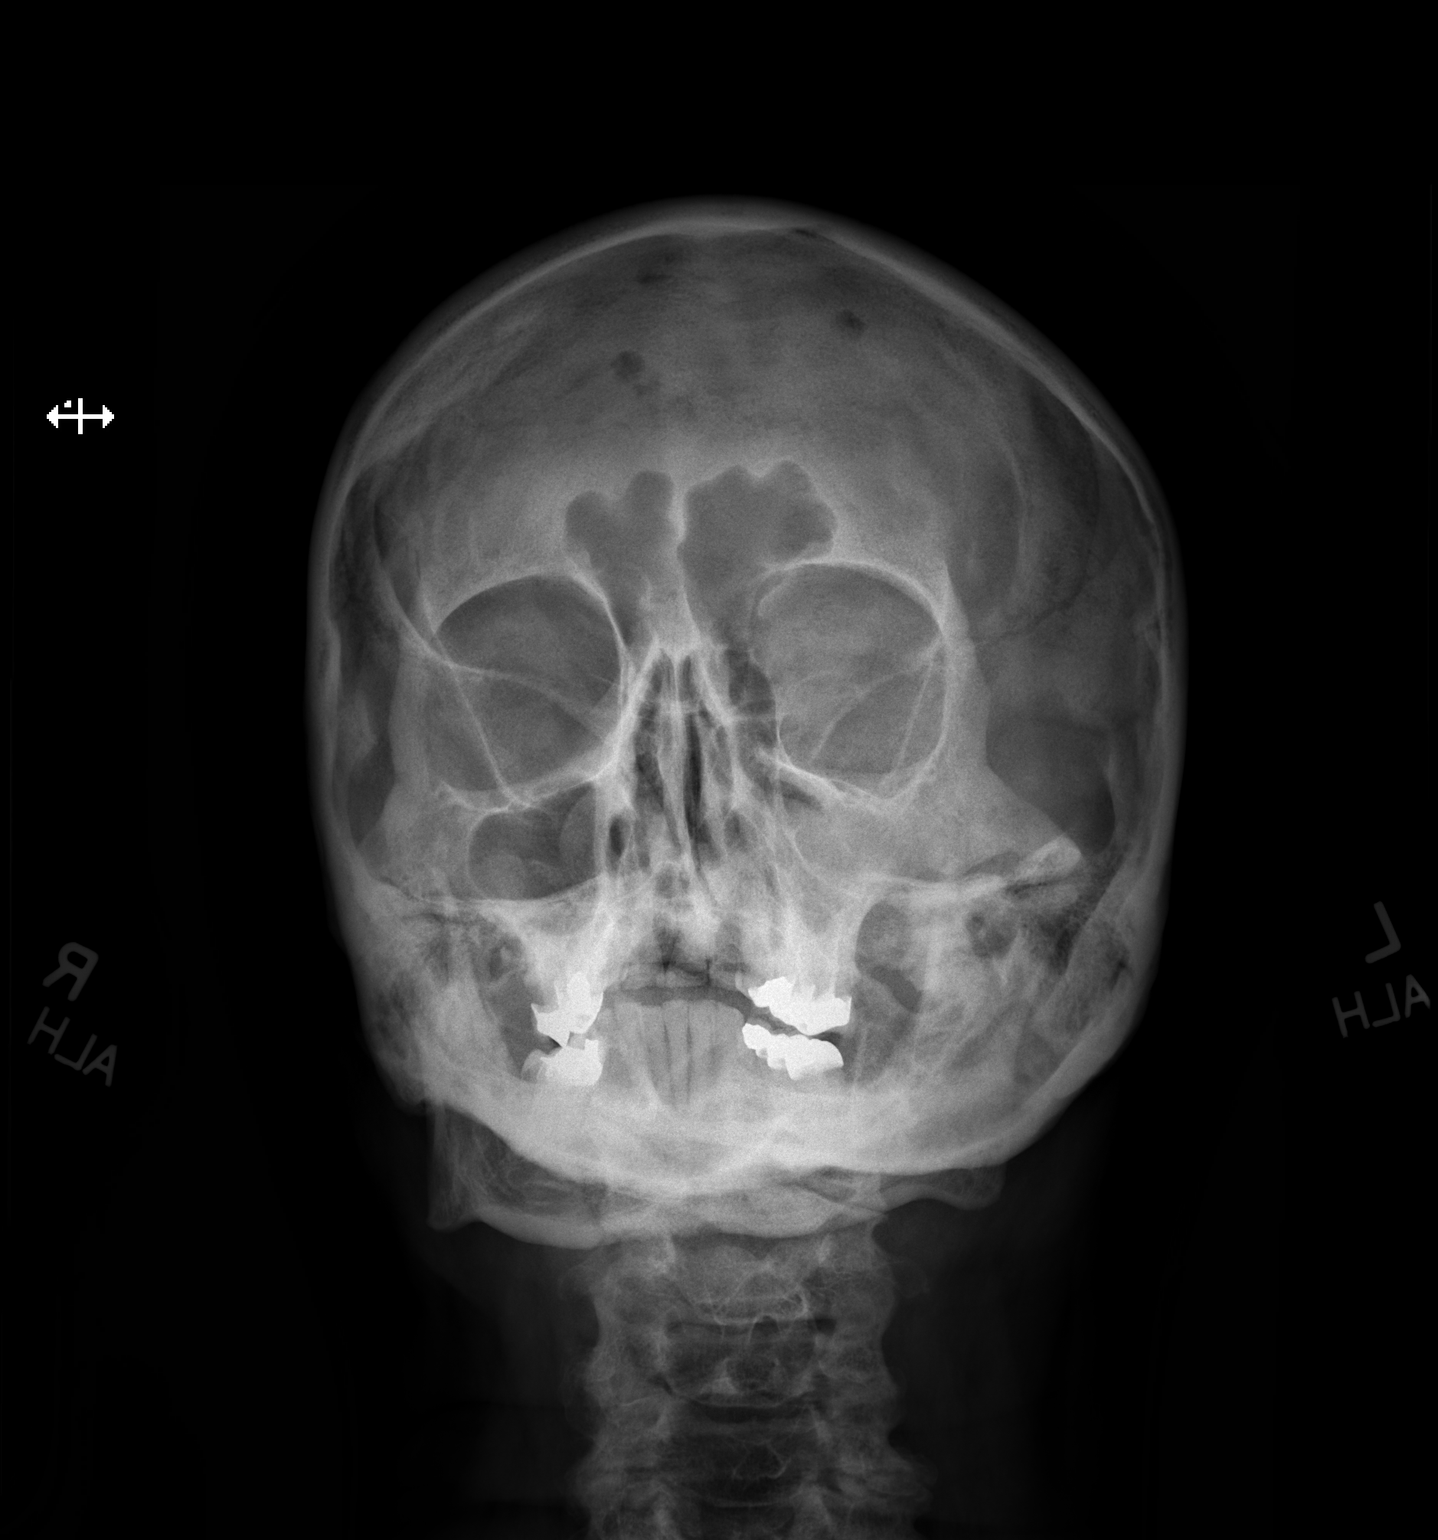

[2 of 2 positions shown; findings below may reference images not displayed]

FINDINGS: There is no evidence of metallic foreign body within the orbits. No
significant bone abnormality is identified. There is subtotal
opacification of the left maxillary sinus.
IMPRESSION: No evidence of metallic foreign body within the orbits.

## 2020-05-21 ENCOUNTER — Other Ambulatory Visit: Payer: Self-pay | Admitting: Family Medicine

## 2020-05-23 ENCOUNTER — Telehealth: Payer: Self-pay | Admitting: Family Medicine

## 2020-05-23 NOTE — Telephone Encounter (Signed)
Patient called back following up on his MRI results. I gave him Dr Michaelle Copas response. He said that he would like to proceed with the epidural.  Can this be ordered please?

## 2020-05-23 NOTE — Telephone Encounter (Signed)
L3-4

## 2020-05-24 ENCOUNTER — Other Ambulatory Visit: Payer: Self-pay

## 2020-05-24 DIAGNOSIS — M5416 Radiculopathy, lumbar region: Secondary | ICD-10-CM

## 2020-05-24 NOTE — Telephone Encounter (Signed)
Order placed. Patient notified.  

## 2020-05-29 ENCOUNTER — Other Ambulatory Visit: Payer: Self-pay

## 2020-05-29 ENCOUNTER — Ambulatory Visit
Admission: RE | Admit: 2020-05-29 | Discharge: 2020-05-29 | Disposition: A | Discharge status: Home / Self Care | Payer: Medicare Other | Source: Ambulatory Visit | Attending: Family Medicine | Admitting: Family Medicine

## 2020-05-29 DIAGNOSIS — M48061 Spinal stenosis, lumbar region without neurogenic claudication: Secondary | ICD-10-CM | POA: Diagnosis not present

## 2020-05-29 DIAGNOSIS — M5416 Radiculopathy, lumbar region: Secondary | ICD-10-CM

## 2020-05-29 IMAGING — XA Imaging study
2 series · 2 of 2 positions shown · non-contrast
Comparison: none

CLINICAL DATA: Lumbosacral spondylosis without myelopathy. Chronic
low back pain radiating to the left knee and foot. No prior
injections or surgery. Advanced spinal canal stenosis from L2-L3
through L4-L5.

[Series 1: ortho standard · 1 of 1 slices shown (1 of 2)]
[im 1/1]
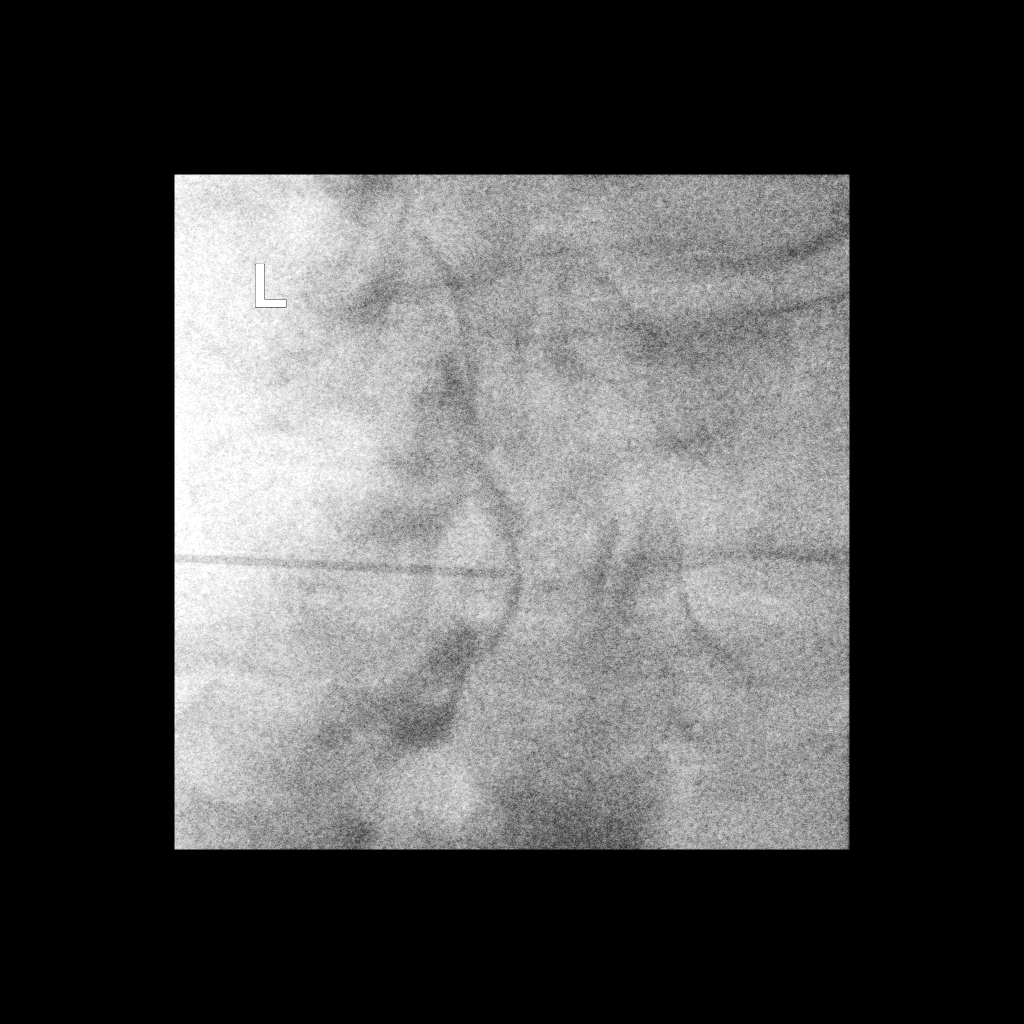

[Series 2: ortho standard · 1 of 1 slices shown (2 of 2)]
[im 1/1]
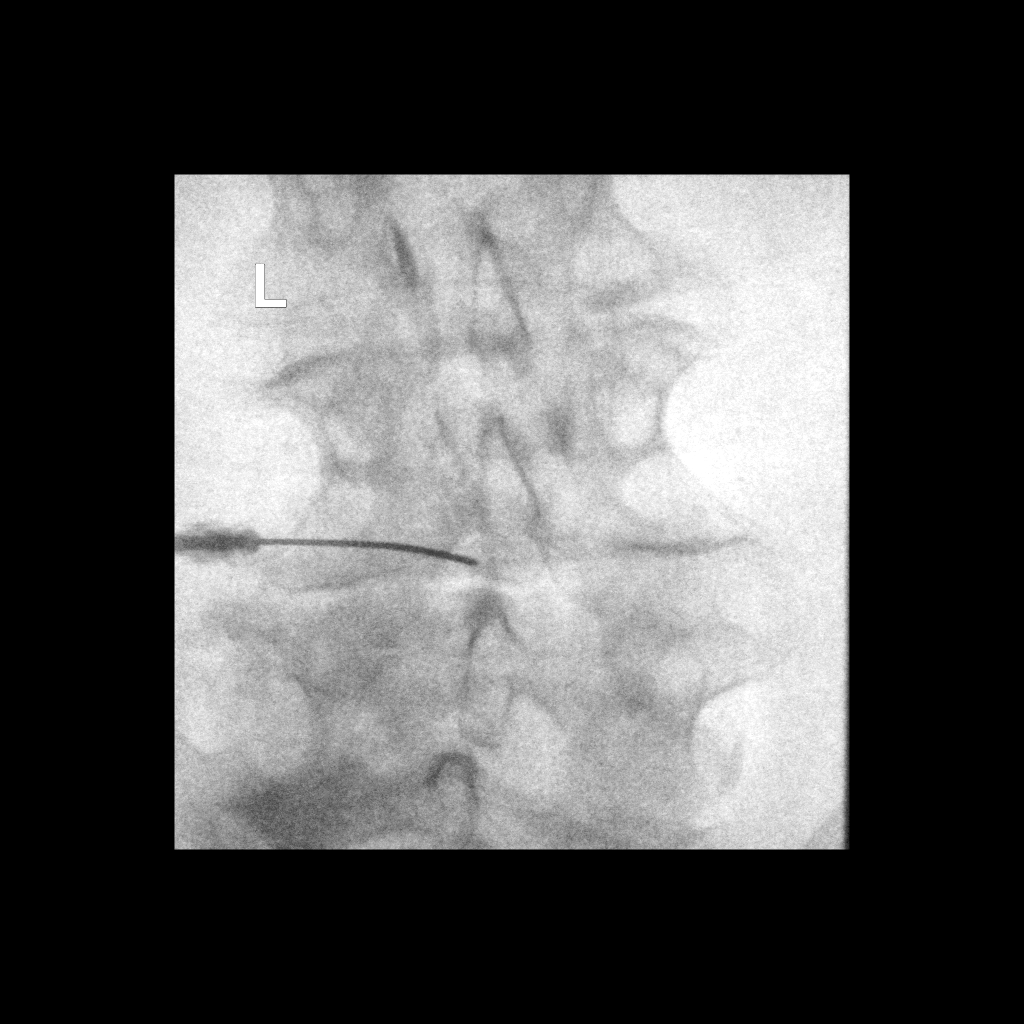

[2 of 2 positions shown; findings below may reference images not displayed]

FLUOROSCOPY TIME:  Radiation Exposure Index (as provided by the
fluoroscopic device): 6.0 mGy

Fluoroscopy Time:  37 seconds

Number of Acquired Images:  0

PROCEDURE:
The procedure, risks, benefits, and alternatives were explained to
the patient. Questions regarding the procedure were encouraged and
answered. The patient understands and consents to the procedure.

LUMBAR EPIDURAL INJECTION:

An interlaminar approach was performed on the left at L3-L4. The
overlying skin was cleansed and anesthetized. A 6 inch 20 gauge
epidural needle was advanced using loss-of-resistance technique.

DIAGNOSTIC EPIDURAL INJECTION:

Injection of Isovue-M 200 shows a good epidural pattern with spread
above and below the level of needle placement, primarily on the
left. No vascular opacification is seen.

THERAPEUTIC EPIDURAL INJECTION:

120 mg of Depo-Medrol mixed with 3 mL of 1% lidocaine were
instilled. The procedure was well-tolerated, and the patient was
discharged thirty minutes following the injection in good condition.

COMPLICATIONS:
None immediate.
IMPRESSION: Technically successful interlaminar epidural injection on the left
at L3-L4.

## 2020-05-29 MED ORDER — METHYLPREDNISOLONE ACETATE 40 MG/ML INJ SUSP (RADIOLOG
120.0000 mg | Freq: Once | INTRAMUSCULAR | Status: AC
  Administered 2020-05-29: 120 mg via EPIDURAL

## 2020-05-29 MED ORDER — IOPAMIDOL (ISOVUE-M 200) INJECTION 41%
1.0000 mL | Freq: Once | INTRAMUSCULAR | Status: AC
  Administered 2020-05-29: 1 mL via EPIDURAL

## 2020-05-29 NOTE — Discharge Instructions (Signed)

## 2020-07-09 ENCOUNTER — Other Ambulatory Visit: Payer: Self-pay

## 2020-07-09 ENCOUNTER — Ambulatory Visit (INDEPENDENT_AMBULATORY_CARE_PROVIDER_SITE_OTHER): Payer: Medicare Other | Admitting: Family Medicine

## 2020-07-09 VITALS — BP 149/79 | HR 101 | Temp 98.2°F | Ht 67.0 in | Wt 196.0 lb

## 2020-07-09 DIAGNOSIS — L299 Pruritus, unspecified: Secondary | ICD-10-CM | POA: Diagnosis not present

## 2020-07-09 DIAGNOSIS — R21 Rash and other nonspecific skin eruption: Secondary | ICD-10-CM

## 2020-07-09 DIAGNOSIS — R410 Disorientation, unspecified: Secondary | ICD-10-CM

## 2020-07-09 DIAGNOSIS — R739 Hyperglycemia, unspecified: Secondary | ICD-10-CM | POA: Diagnosis not present

## 2020-07-09 DIAGNOSIS — I10 Essential (primary) hypertension: Secondary | ICD-10-CM

## 2020-07-09 MED ORDER — TRIAMCINOLONE 0.1 % CREAM:EUCERIN CREAM 1:1: 1.0000 "application " | TOPICAL_CREAM | Freq: Two times a day (BID) | CUTANEOUS | 1 refills | Status: DC

## 2020-07-09 NOTE — Progress Notes (Addendum)
   Tyrone [name] is a 75 y.o. male who presents today for an office visit.  Assessment/Plan:  New/Acute Problems: Rash Was diagnosed with prurigo nodularis. Will start topical triamcinolone-Eucerin mixture. Also recommended Sarna lotion and capsaicin as needed for pruritus. Given his confusion with Benadryl recommend he avoid all antihistamines going forward  Confusion Likely secondary to Benadryl. We will check blood work today to make sure there is nothing else is going on. Neuro exam is reassuring. He appears alert and oriented today with no apparent audiovisual hallucinations. They will let me know if symptoms persist despite stopping high-dose Benadryl usage.  Chronic Problems Addressed Today: Hyperglycemia Check A1c.  Essential hypertension He is on amlodipine 10 mg daily and losartan milligrams daily. Tolerating both these well.    Subjective:  HPI:  Patient here with pruritic rash for the last several months. He has seen dermatology over this time. He has not been happy with management thus far. He was told to take antihistamines as needed but was told there was nothing else that can be done. Looks like he was diagnosed with prurigo nodularis. Patient has been taking Benadryl 4 times daily for the last several months. This helps with managing his pruritus however rash persists. Located on upper extremities back and back of his neck. His son is concerned that he has been more confused the last several days as well. He attributes this to the Benadryl. No reported fevers or chills. Will sometimes see people that are not there. Patient admits that he is a little more confused as well. He is able to state his name, location, and year month, and day of the week. No weakness or numbness. No vision changes.        Objective:  Physical Exam: BP (!) 149/79   Pulse (!) 101   Temp 98.2 F (36.8 C) (Temporal)   Ht 5\' 7"  (1.702 m)   Wt 196 lb (88.9 kg)   SpO2 100%   BMI 30.70 kg/m    Gen: No acute distress, resting comfortably Neuro: Grossly normal, moves all extremities. Alert and oriented to person place and time. No apparent AVH. CN2-12 intact.  Skin: Several erythematous excoriated lesions across upper extremities, back of neck, and upper back. Psych: Normal affect and thought content      Caleb M. [name], MD 07/09/2020 3:36 PM

## 2020-07-09 NOTE — Assessment & Plan Note (Signed)
He is on amlodipine 10 mg daily and losartan milligrams daily. Tolerating both these well.

## 2020-07-09 NOTE — Patient Instructions (Signed)
It was very nice to see you today!  Please stop the Benadryl.  I think this is was causing your confusion.  We will check labs today to make sure there is nothing else is going on.  I will send in a medicated lotion for you to use twice daily.  Please also use Sarna and topical capsaicin for areas that are very itchy.  Let me know if your rash or confusion does not improve over the next several days.  Take care, Dr [name]  Please try these tips to maintain a healthy lifestyle:   Eat at least 3 REAL meals and 1-2 snacks per day.  Aim for no more than 5 hours between eating.  If you eat breakfast, please do so within one hour of getting up.    Each meal should contain half fruits/vegetables, one quarter protein, and one quarter carbs (no bigger than a computer mouse)   Cut down on sweet beverages. This includes juice, soda, and sweet tea.     Drink at least 1 glass of water with each meal and aim for at least 8 glasses per day   Exercise at least 150 minutes every week.

## 2020-07-09 NOTE — Assessment & Plan Note (Signed)
Check A1c. 

## 2020-07-10 LAB — CBC
HCT: 35.3 % — ABNORMAL LOW (ref 39.0–52.0)
Hemoglobin: 11.3 g/dL — ABNORMAL LOW (ref 13.0–17.0)
MCHC: 32.1 g/dL (ref 30.0–36.0)
MCV: 89.4 fl (ref 78.0–100.0)
Platelets: 225 10*3/uL (ref 150.0–400.0)
RBC: 3.94 Mil/uL — ABNORMAL LOW (ref 4.22–5.81)
RDW: 18.5 % — ABNORMAL HIGH (ref 11.5–15.5)
WBC: 7.4 10*3/uL (ref 4.0–10.5)

## 2020-07-10 LAB — TSH: TSH: 1.34 u[IU]/mL (ref 0.35–4.50)

## 2020-07-10 LAB — COMPREHENSIVE METABOLIC PANEL
ALT: 16 U/L (ref 0–53)
AST: 30 U/L (ref 0–37)
Albumin: 4.1 g/dL (ref 3.5–5.2)
Alkaline Phosphatase: 107 U/L (ref 39–117)
BUN: 16 mg/dL (ref 6–23)
CO2: 28 mEq/L (ref 19–32)
Calcium: 9.5 mg/dL (ref 8.4–10.5)
Chloride: 97 mEq/L (ref 96–112)
Creatinine, Ser: 1.31 mg/dL (ref 0.40–1.50)
GFR: 53.49 mL/min — ABNORMAL LOW (ref >60.00)
Glucose, Bld: 96 mg/dL (ref 70–99)
Potassium: 4.4 mEq/L (ref 3.5–5.1)
Sodium: 135 mEq/L (ref 135–145)
Total Bilirubin: 1.1 mg/dL (ref 0.2–1.2)
Total Protein: 7.4 g/dL (ref 6.0–8.3)

## 2020-07-10 LAB — HEMOGLOBIN A1C: Hgb A1c MFr Bld: 6 % (ref 4.6–6.5)

## 2020-07-10 NOTE — Progress Notes (Signed)
Please inform patient of the following:  Labs are all stable. Do not need to change his treatment plan at this time. Would like for him to let us know if their symptoms are not improving.  [name]. [name], MD 07/10/2020 12:59 PM

## 2020-07-11 ENCOUNTER — Telehealth: Payer: Self-pay

## 2020-07-11 NOTE — Telephone Encounter (Signed)
Triamcinolone Acetonide (TRIAMCINOLONE 0.1 % CREAM : EUCERIN) CREA  Pt states that pharmacy has not received the prescription

## 2020-07-12 ENCOUNTER — Other Ambulatory Visit: Payer: Self-pay | Admitting: *Deleted

## 2020-07-12 MED ORDER — TRIAMCINOLONE 0.1 % CREAM:EUCERIN CREAM 1:1: 1.0000 "application " | TOPICAL_CREAM | Freq: Two times a day (BID) | CUTANEOUS | 1 refills | Status: DC

## 2020-07-12 MED ORDER — TRIAMCINOLONE 0.1 % CREAM:EUCERIN CREAM 1:1: 1.0000 "application " | TOPICAL_CREAM | Freq: Two times a day (BID) | CUTANEOUS | 2 refills | Status: AC

## 2020-07-12 MED ORDER — TRIAMCINOLONE 0.1 % CREAM:EUCERIN CREAM 1:1: 1.0000 "application " | TOPICAL_CREAM | Freq: Two times a day (BID) | CUTANEOUS | 2 refills | Status: DC

## 2020-07-12 NOTE — Telephone Encounter (Signed)
Rx faxed

## 2020-07-21 ENCOUNTER — Other Ambulatory Visit: Payer: Self-pay | Admitting: Family Medicine

## 2020-07-31 ENCOUNTER — Other Ambulatory Visit: Payer: Self-pay | Admitting: Family Medicine

## 2020-08-02 NOTE — Progress Notes (Signed)
[place] Phone: [phone] Subjective:   Tyrone [name], am serving as a scribe for Dr. Hulan Saas. This visit occurred during the SARS-CoV-2 public health emergency.  Safety protocols were in place, including screening questions prior to the visit, additional usage of staff PPE, and extensive cleaning of exam room while observing appropriate contact time as indicated for disinfecting solutions.   I'm seeing this patient by the request  of:  Vivi Barrack, MD  CC: Low back pain follow-up  POE:[id number]   04/10/2020 Patient is having worsening symptoms at this time.  Not well controlled.  Patient does have some atrophy noted.Discussed the gabapentin and continue the same dose at the moment.  We could potentially increase the patient is having signs and symptoms more consistent with nerve impingement with possible spinal stenosis.  MRI ordered today for further evaluation and patient would be a candidate for potential epidurals.  Patient will follow up with me after imaging to discuss or we will call him and then follow-up after epidural or nerve root injections if necessary total time reviewing patient's chart and talking to patient 31 minutes today  Update 08/03/2020 Tyrone [name] is a 75 y.o. male coming in with complaint of lumbar spine pain. Patient states that movement causes pain but sitting down alleviates his pain. Epidural helped for 3 weeks. Having left side radiculopathy. Epidural 05/29/2020.  Patient's MRI did show the patient has severe spinal stenosis at L3-L4 and moderate to severe at L2-L3 and L4-L5.  Severe foraminal stenosis from L3-L5 also noted. Patient states that the pain is unrelenting, continues to give him more discomfort.  States that it is more of an ache than truly the radicular symptoms but patient does have to stop after 10 to 15 minutes and sit down.  If he sits for 5 minutes he seems to  do better again.     Past Medical History:  Diagnosis Date  . Anemia   . Arthritis    hands  . Blood transfusion without reported diagnosis    for stomach ulcers  . Depression   . Hypertension   . Kidney stone   . Multiple duodenal ulcers    Past Surgical History:  Procedure Laterality Date  . ABDOMINAL SURGERY     ulcers  . COLONOSCOPY    . LITHOTRIPSY     Social History   Socioeconomic History  . Marital status: Married    Spouse name: Not on file  . Number of children: Not on file  . Years of education: Not on file  . Highest education level: Not on file  Occupational History  . Not on file  Tobacco Use  . Smoking status: Former Smoker    Quit date: 05/30/1980    Years since quitting: 40.2  . Smokeless tobacco: Former Systems developer    Types: [place] date: 05/30/2007  Substance and Sexual Activity  . Alcohol use: Not Currently    Alcohol/week: 0.0 standard drinks    Comment: sober 2.5 months  . Drug use: [name]  . Sexual activity: Not on file  Other Topics Concern  . Not on file  Social History Narrative  . Not on file   Social Determinants of Health   Financial Resource Strain: Not on file  Food Insecurity: Not on file  Transportation Needs: Not on file  Physical Activity: Not on file  Stress: Not on file  Social Connections: Not  on file   [name] Known Allergies Family History  Problem Relation Age of Onset  . Colon cancer Neg Hx   . Esophageal cancer Neg Hx   . Rectal cancer Neg Hx   . Stomach cancer Neg Hx     Current Outpatient Medications (Endocrine & Metabolic):  .  metFORMIN (GLUCOPHAGE) 500 MG tablet, Take 1 tablet (500 mg total) by mouth daily with breakfast.  Current Outpatient Medications (Cardiovascular):  .  amLODipine (NORVASC) 10 MG tablet, TAKE 1 TABLET BY MOUTH DAILY .  losartan (COZAAR) 100 MG tablet, Take 1 tablet (100 mg total) by mouth daily.   Current Outpatient Medications (Analgesics):  .  allopurinol (ZYLOPRIM) 300 MG tablet,  TAKE 1 TABLET BY MOUTH DAILY  Current Outpatient Medications (Hematological):  Marland Kitchen  Cyanocobalamin (B-12) 5000 MCG CAPS, Take by mouth 2 (two) times daily. .  folic acid (FOLVITE) 1 MG tablet, Take 1 mg by mouth daily.  Current Outpatient Medications (Other):  .  citalopram (CELEXA) 40 MG tablet, TAKE 1 TABLET BY MOUTH EVERY DAY .  cyclobenzaprine (FLEXERIL) 10 MG tablet, Take 1 tablet (10 mg total) by mouth 3 (three) times daily as needed for muscle spasms. (Patient not taking: Reported on 07/09/2020) .  pantoprazole (PROTONIX) 40 MG tablet, TAKE 1 TABLET BY MOUTH TWICE DAILY .  Triamcinolone Acetonide (TRIAMCINOLONE 0.1 % CREAM : EUCERIN) CREA, Apply 1 application topically 2 (two) times daily.   Reviewed prior external information including notes and imaging from  primary care provider As well as notes that were available from care everywhere and other healthcare systems.  Past medical history, social, surgical and family history all reviewed in electronic medical record.  [name] pertanent information unless stated regarding to the chief complaint.   Review of Systems:  [name] headache, visual changes, nausea, vomiting, diarrhea, constipation, dizziness, abdominal pain, skin rash, fevers, chills, night sweats, weight loss, swollen lymph nodes, body aches, joint swelling, chest pain, shortness of breath, mood changes. POSITIVE muscle aches  Objective  Blood pressure 124/72, pulse 88, height 5\' 7"  (1.702 m), weight 200 lb (90.7 kg), SpO2 97 %.   General: [name] apparent distress alert and oriented x3 mood and affect normal, dressed appropriately.  HEENT: Pupils equal, extraocular movements intact  Respiratory: Patient's speak in full sentences and does not appear short of breath  Cardiovascular: [name] lower extremity edema, non tender, [name] erythema  Gait antalgic gait. Patient does have atrophy of the thighs bilaterally. Low back exam does have significant loss of lordosis and degenerative scoliosis.   Significant tightness with straight leg test but [name] radicular symptoms.  Worsening pain noted in the back with any extension greater than 5 degrees.  Difficulty secondary to tightness with FABER test but [name] radicular symptoms.  Seems to be neurovascularly intact distally.    Impression and Recommendations:     The above documentation has been reviewed and is accurate and complete Tyrone Pulley, DO

## 2020-08-03 ENCOUNTER — Encounter: Payer: Self-pay | Admitting: Family Medicine

## 2020-08-03 ENCOUNTER — Other Ambulatory Visit: Payer: Self-pay

## 2020-08-03 ENCOUNTER — Ambulatory Visit (INDEPENDENT_AMBULATORY_CARE_PROVIDER_SITE_OTHER): Payer: Medicare Other | Admitting: Family Medicine

## 2020-08-03 VITALS — BP 124/72 | HR 88 | Ht 67.0 in | Wt 200.0 lb

## 2020-08-03 DIAGNOSIS — M5416 Radiculopathy, lumbar region: Secondary | ICD-10-CM | POA: Diagnosis not present

## 2020-08-03 DIAGNOSIS — M48061 Spinal stenosis, lumbar region without neurogenic claudication: Secondary | ICD-10-CM | POA: Diagnosis not present

## 2020-08-03 NOTE — Patient Instructions (Signed)
[facility] [phone] Follow up with use 4-6 weeks after injection-Call once you have appt If any changes, call us or seek medication attention

## 2020-08-03 NOTE — Assessment & Plan Note (Signed)
Patient has advanced degree of severity of patient's cervical and lumbar spinal stenosis.  Patient does have atrophy of the lower extremities.  We discussed the potential for any surgical intervention which patient would like to avoid.  We did discuss with patient though did have about 3 weeks of improvement after the epidural that we could consider this again.  We discussed that sometimes up to 3 with a 21-month window seems to actually continue to make improvement.  Patient would like to try this at the moment.  We discussed different medications.  Patient would like to hold on anything else at this time.  We discussed other options would include referral to neurosurgery or pain management which patient declined follow-up with me again 4 weeks after the epidural to see how patient is responding.

## 2020-08-09 ENCOUNTER — Ambulatory Visit
Admission: RE | Admit: 2020-08-09 | Discharge: 2020-08-09 | Disposition: A | Discharge status: Home / Self Care | Payer: Medicare Other | Source: Ambulatory Visit | Attending: Family Medicine | Admitting: Family Medicine

## 2020-08-09 DIAGNOSIS — M5416 Radiculopathy, lumbar region: Secondary | ICD-10-CM

## 2020-08-09 DIAGNOSIS — M48062 Spinal stenosis, lumbar region with neurogenic claudication: Secondary | ICD-10-CM | POA: Diagnosis not present

## 2020-08-09 IMAGING — XA Imaging study
2 series · 2 of 2 positions shown · non-contrast
Comparison: none

CLINICAL DATA: Patient reports moderate improvement following the
initial injection. Some continued symptoms of spinal stenosis with
claudication.

[Series 1: ortho standard · 1 of 1 slices shown (1 of 2)]
[im 1/1]
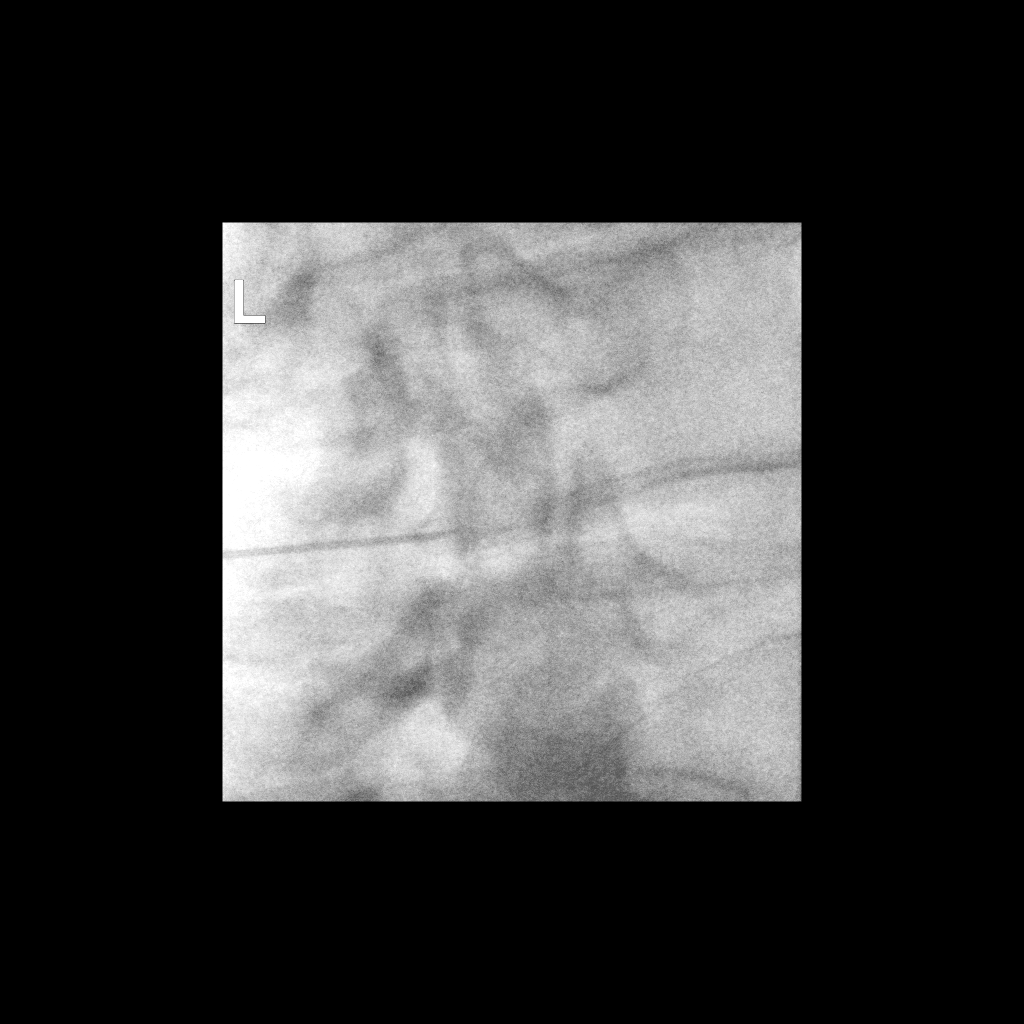

[Series 2: ortho standard · 1 of 1 slices shown (2 of 2)]
[im 1/1]
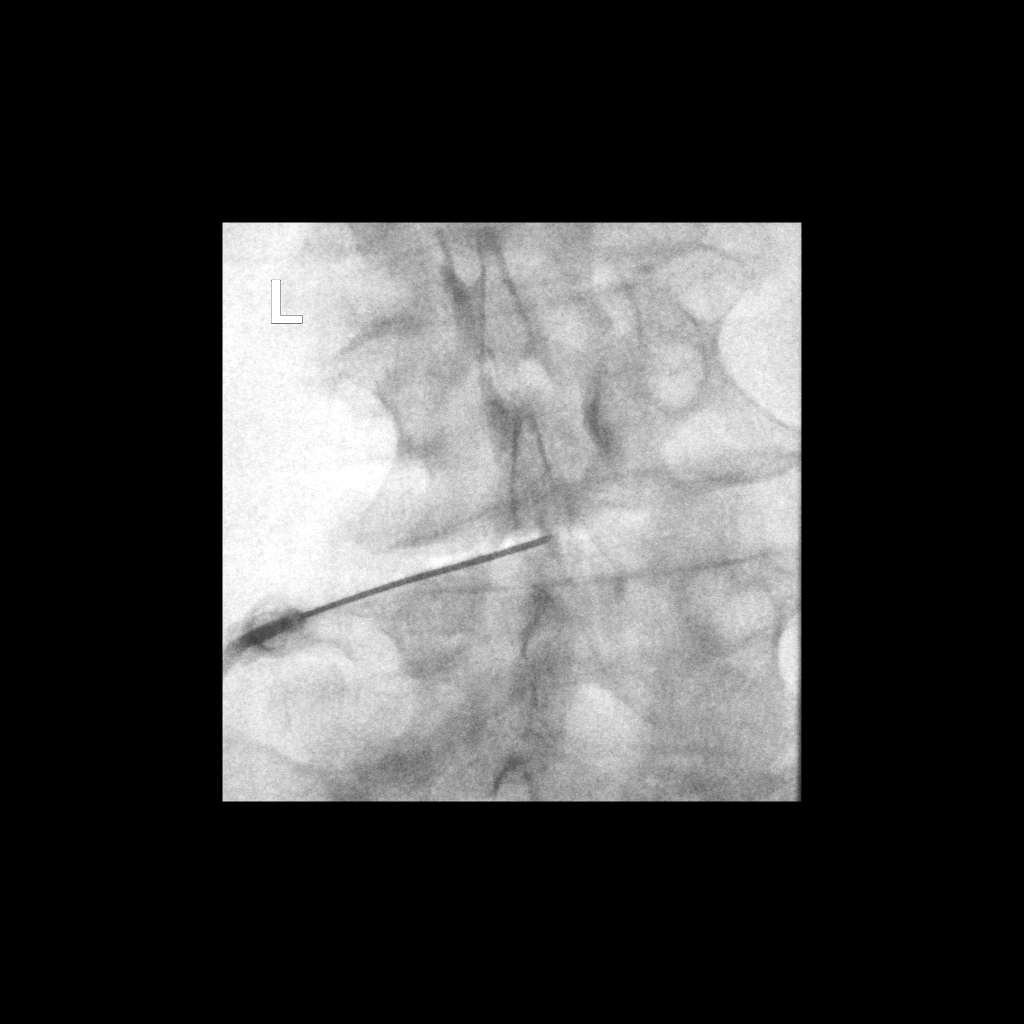

[2 of 2 positions shown; findings below may reference images not displayed]

FLUOROSCOPY TIME:  0 minutes 59 seconds. 65.53 micro gray meter
squared

PROCEDURE:
The procedure, risks, benefits, and alternatives were explained to
the patient. Questions regarding the procedure were encouraged and
answered. The patient understands and consents to the procedure.

LUMBAR EPIDURAL INJECTION:

An interlaminar approach was performed on the left at L3-4. The
overlying skin was cleansed and anesthetized. A 20 gauge epidural
needle was advanced using loss-of-resistance technique.

DIAGNOSTIC EPIDURAL INJECTION:

Injection of Isovue-M 200 shows a good epidural pattern with spread
above and below the level of needle placement, extending to both
sides. No vascular opacification is seen.

THERAPEUTIC EPIDURAL INJECTION:

One hundred twenty mg of Depo-Medrol mixed with 2 cc 1% lidocaine
were instilled. The procedure was well-tolerated, and the patient
was discharged thirty minutes following the injection in good
condition.

COMPLICATIONS:
None
IMPRESSION: Technically successful epidural injection on the left at L3-4 # 2.

Note that a 6 inch spinal needle was necessary to reach the epidural
space at the L3-4 level.

## 2020-08-09 MED ORDER — METHYLPREDNISOLONE ACETATE 40 MG/ML INJ SUSP (RADIOLOG
120.0000 mg | Freq: Once | INTRAMUSCULAR | Status: AC
  Administered 2020-08-09: 120 mg via EPIDURAL

## 2020-08-09 MED ORDER — IOPAMIDOL (ISOVUE-M 200) INJECTION 41%
1.0000 mL | Freq: Once | INTRAMUSCULAR | Status: AC
  Administered 2020-08-09: 1 mL via EPIDURAL

## 2020-08-09 NOTE — Discharge Instructions (Signed)

## 2020-08-20 ENCOUNTER — Other Ambulatory Visit: Payer: Self-pay | Admitting: Family Medicine

## 2020-09-25 ENCOUNTER — Other Ambulatory Visit: Payer: Self-pay | Admitting: Family Medicine

## 2020-09-26 ENCOUNTER — Other Ambulatory Visit: Payer: Self-pay | Admitting: *Deleted

## 2020-10-03 ENCOUNTER — Other Ambulatory Visit: Payer: Self-pay

## 2020-10-03 ENCOUNTER — Ambulatory Visit (INDEPENDENT_AMBULATORY_CARE_PROVIDER_SITE_OTHER): Payer: Medicare Other | Admitting: Family Medicine

## 2020-10-03 ENCOUNTER — Encounter: Payer: Self-pay | Admitting: Family Medicine

## 2020-10-03 VITALS — BP 161/82 | HR 75 | Temp 97.9°F | Ht 67.0 in | Wt 197.2 lb

## 2020-10-03 DIAGNOSIS — M109 Gout, unspecified: Secondary | ICD-10-CM

## 2020-10-03 DIAGNOSIS — R739 Hyperglycemia, unspecified: Secondary | ICD-10-CM | POA: Diagnosis not present

## 2020-10-03 DIAGNOSIS — I10 Essential (primary) hypertension: Secondary | ICD-10-CM | POA: Diagnosis not present

## 2020-10-03 DIAGNOSIS — K219 Gastro-esophageal reflux disease without esophagitis: Secondary | ICD-10-CM

## 2020-10-03 NOTE — Patient Instructions (Signed)
It was very nice to see you today!  Please keep an eye on your blood pressure.  Let me know if persistently 150/90 or higher.   No other changes today.  I will see back in 6 months.  We will do your annual physical we will check labs at that time.  Take care, Dr [name]  PLEASE NOTE:  If you had any lab tests please let us know if you have not heard back within a few days. You may see your results on mychart before we have a chance to review them but we will give you a call once they are reviewed by [place]. If we ordered any referrals today, please let us know if you have not heard from their office within the next week.   Please try these tips to maintain a healthy lifestyle:   Eat at least 3 REAL meals and 1-2 snacks per day.  Aim for no more than 5 hours between eating.  If you eat breakfast, please do so within one hour of getting up.    Each meal should contain half fruits/vegetables, one quarter protein, and one quarter carbs (no bigger than a computer mouse)   Cut down on sweet beverages. This includes juice, soda, and sweet tea.     Drink at least 1 glass of water with each meal and aim for at least 8 glasses per day   Exercise at least 150 minutes every week.

## 2020-10-03 NOTE — Assessment & Plan Note (Signed)
Patient recently stopped losartan as he thought it was also contributing to his dizziness.  Blood pressure is elevated to 160s over 70s today.  Reportedly home readings have been in the 130s over 80s at home.  Advised him to continue home monitoring and check with me in a few weeks to let me know if blood pressures persistently elevated.  I think the gabapentin was the most likely contributor to his dizziness and we can probably start back low-dose losartan depending on his home blood pressure readings if needed.

## 2020-10-03 NOTE — Assessment & Plan Note (Signed)
Patient recently stopped metformin.  Felt like was contributing to his dizziness.  Last A1c was 6.0 and before this was 6.3.  Discussed lifestyle modifications.  Ok with him staying off metformin.  We will recheck A1c in 6 months.

## 2020-10-03 NOTE — Assessment & Plan Note (Signed)
Stable on Protonix 40 mg daily. 

## 2020-10-03 NOTE — Assessment & Plan Note (Signed)
No recent flares.  On allopurinol 300 mg daily.  We can check uric acid level with next blood draw.

## 2020-10-03 NOTE — Progress Notes (Signed)
   Tyrone [name] is a 75 y.o. male who presents today for an office visit.  Assessment/Plan:  Chronic Problems Addressed Today: Hyperglycemia Patient recently stopped metformin.  Felt like was contributing to his dizziness.  Last A1c was 6.0 and before this was 6.3.  Discussed lifestyle modifications.  Ok with him staying off metformin.  We will recheck A1c in 6 months.  Essential hypertension Patient recently stopped losartan as he thought it was also contributing to his dizziness.  Blood pressure is elevated to 160s over 70s today.  Reportedly home readings have been in the 130s over 80s at home.  Advised him to continue home monitoring and check with me in a few weeks to let me know if blood pressures persistently elevated.  I think the gabapentin was the most likely contributor to his dizziness and we can probably start back low-dose losartan depending on his home blood pressure readings if needed.   GERD (gastroesophageal reflux disease) Stable on Protonix 40 mg daily.  Gout No recent flares.  On allopurinol 300 mg daily.  We can check uric acid level with next blood draw.    Subjective:  HPI:  See A/p.         Objective:  Physical Exam: BP (!) 161/82   Pulse 75   Temp 97.9 F (36.6 C)   Ht 5\' 7"  (1.702 m)   Wt 197 lb 3.2 oz (89.4 kg)   SpO2 97%   BMI 30.89 kg/m   Gen: No acute distress, resting comfortably CV: Regular rate and rhythm with no murmurs appreciated Pulm: Normal work of breathing, clear to auscultation bilaterally with no crackles, wheezes, or rhonchi Neuro: Grossly normal, moves all extremities Psych: Normal affect and thought content      Tad Fancher M. [name], MD 10/03/2020 8:48 AM

## 2020-10-30 ENCOUNTER — Telehealth (INDEPENDENT_AMBULATORY_CARE_PROVIDER_SITE_OTHER): Payer: Medicare Other | Admitting: Registered Nurse

## 2020-10-30 VITALS — BP 130/93

## 2020-10-30 DIAGNOSIS — L03119 Cellulitis of unspecified part of limb: Secondary | ICD-10-CM

## 2020-10-30 MED ORDER — FUROSEMIDE 20 MG PO TABS: 20.0000 mg | ORAL_TABLET | Freq: Every day | ORAL | 0 refills | Status: DC | PRN

## 2020-10-30 MED ORDER — SULFAMETHOXAZOLE-TRIMETHOPRIM 800-160 MG PO TABS: 1.0000 | ORAL_TABLET | Freq: Two times a day (BID) | ORAL | 0 refills | Status: DC

## 2020-11-10 ENCOUNTER — Encounter (HOSPITAL_COMMUNITY): Payer: Self-pay | Admitting: Emergency Medicine

## 2020-11-10 ENCOUNTER — Other Ambulatory Visit: Payer: Self-pay

## 2020-11-10 ENCOUNTER — Emergency Department (HOSPITAL_COMMUNITY): Payer: Medicare Other

## 2020-11-10 ENCOUNTER — Inpatient Hospital Stay (HOSPITAL_COMMUNITY)
Admission: EM | Admit: 2020-11-10 | Discharge: 2020-11-20 | DRG: 987 | Disposition: A | Discharge status: Home Health | Payer: Medicare Other | Attending: Internal Medicine | Admitting: Internal Medicine

## 2020-11-10 DIAGNOSIS — F10231 Alcohol dependence with withdrawal delirium: Secondary | ICD-10-CM

## 2020-11-10 DIAGNOSIS — E8770 Fluid overload, unspecified: Secondary | ICD-10-CM | POA: Diagnosis not present

## 2020-11-10 DIAGNOSIS — Z20822 Contact with and (suspected) exposure to covid-19: Secondary | ICD-10-CM | POA: Diagnosis present

## 2020-11-10 DIAGNOSIS — Z791 Long term (current) use of non-steroidal anti-inflammatories (NSAID): Secondary | ICD-10-CM

## 2020-11-10 DIAGNOSIS — N179 Acute kidney failure, unspecified: Secondary | ICD-10-CM | POA: Diagnosis not present

## 2020-11-10 DIAGNOSIS — F1010 Alcohol abuse, uncomplicated: Secondary | ICD-10-CM

## 2020-11-10 DIAGNOSIS — I110 Hypertensive heart disease with heart failure: Secondary | ICD-10-CM | POA: Diagnosis not present

## 2020-11-10 DIAGNOSIS — Z6830 Body mass index (BMI) 30.0-30.9, adult: Secondary | ICD-10-CM

## 2020-11-10 DIAGNOSIS — Z87442 Personal history of urinary calculi: Secondary | ICD-10-CM

## 2020-11-10 DIAGNOSIS — R600 Localized edema: Secondary | ICD-10-CM | POA: Diagnosis not present

## 2020-11-10 DIAGNOSIS — E119 Type 2 diabetes mellitus without complications: Secondary | ICD-10-CM | POA: Diagnosis present

## 2020-11-10 DIAGNOSIS — I444 Left anterior fascicular block: Secondary | ICD-10-CM | POA: Diagnosis present

## 2020-11-10 DIAGNOSIS — I5021 Acute systolic (congestive) heart failure: Secondary | ICD-10-CM | POA: Diagnosis present

## 2020-11-10 DIAGNOSIS — Y92239 Unspecified place in hospital as the place of occurrence of the external cause: Secondary | ICD-10-CM | POA: Diagnosis not present

## 2020-11-10 DIAGNOSIS — K219 Gastro-esophageal reflux disease without esophagitis: Secondary | ICD-10-CM | POA: Diagnosis present

## 2020-11-10 DIAGNOSIS — S01112A Laceration without foreign body of left eyelid and periocular area, initial encounter: Secondary | ICD-10-CM | POA: Diagnosis not present

## 2020-11-10 DIAGNOSIS — M109 Gout, unspecified: Secondary | ICD-10-CM | POA: Diagnosis present

## 2020-11-10 DIAGNOSIS — F10931 Alcohol use, unspecified with withdrawal delirium: Secondary | ICD-10-CM

## 2020-11-10 DIAGNOSIS — I10 Essential (primary) hypertension: Secondary | ICD-10-CM

## 2020-11-10 DIAGNOSIS — Z8711 Personal history of peptic ulcer disease: Secondary | ICD-10-CM

## 2020-11-10 DIAGNOSIS — D509 Iron deficiency anemia, unspecified: Secondary | ICD-10-CM | POA: Diagnosis present

## 2020-11-10 DIAGNOSIS — G928 Other toxic encephalopathy: Secondary | ICD-10-CM | POA: Diagnosis present

## 2020-11-10 DIAGNOSIS — Z87891 Personal history of nicotine dependence: Secondary | ICD-10-CM

## 2020-11-10 DIAGNOSIS — R079 Chest pain, unspecified: Secondary | ICD-10-CM | POA: Diagnosis not present

## 2020-11-10 DIAGNOSIS — Y901 Blood alcohol level of 20-39 mg/100 ml: Secondary | ICD-10-CM | POA: Diagnosis present

## 2020-11-10 DIAGNOSIS — R062 Wheezing: Secondary | ICD-10-CM

## 2020-11-10 DIAGNOSIS — E669 Obesity, unspecified: Secondary | ICD-10-CM | POA: Diagnosis present

## 2020-11-10 DIAGNOSIS — F10229 Alcohol dependence with intoxication, unspecified: Secondary | ICD-10-CM | POA: Diagnosis present

## 2020-11-10 DIAGNOSIS — Z79899 Other long term (current) drug therapy: Secondary | ICD-10-CM

## 2020-11-10 DIAGNOSIS — G9340 Encephalopathy, unspecified: Secondary | ICD-10-CM

## 2020-11-10 DIAGNOSIS — R7989 Other specified abnormal findings of blood chemistry: Secondary | ICD-10-CM | POA: Diagnosis present

## 2020-11-10 DIAGNOSIS — Z7984 Long term (current) use of oral hypoglycemic drugs: Secondary | ICD-10-CM

## 2020-11-10 DIAGNOSIS — W06XXXA Fall from bed, initial encounter: Secondary | ICD-10-CM | POA: Diagnosis not present

## 2020-11-10 DIAGNOSIS — E871 Hypo-osmolality and hyponatremia: Secondary | ICD-10-CM | POA: Diagnosis not present

## 2020-11-10 DIAGNOSIS — Z79890 Hormone replacement therapy (postmenopausal): Secondary | ICD-10-CM

## 2020-11-10 DIAGNOSIS — F32A Depression, unspecified: Secondary | ICD-10-CM | POA: Diagnosis present

## 2020-11-10 DIAGNOSIS — E876 Hypokalemia: Secondary | ICD-10-CM | POA: Diagnosis not present

## 2020-11-10 LAB — HEPATIC FUNCTION PANEL
ALT: 24 U/L (ref 0–44)
AST: 27 U/L (ref 15–41)
Albumin: 4.1 g/dL (ref 3.5–5.0)
Alkaline Phosphatase: 88 U/L (ref 38–126)
Bilirubin, Direct: 0.2 mg/dL (ref 0.0–0.2)
Indirect Bilirubin: 0.9 mg/dL (ref 0.3–0.9)
Total Bilirubin: 1.1 mg/dL (ref 0.3–1.2)
Total Protein: 7.4 g/dL (ref 6.5–8.1)

## 2020-11-10 LAB — BASIC METABOLIC PANEL
Anion gap: 15 (ref 5–15)
BUN: 8 mg/dL (ref 8–23)
CO2: 23 mmol/L (ref 22–32)
Calcium: 9 mg/dL (ref 8.9–10.3)
Chloride: 96 mmol/L — ABNORMAL LOW (ref 98–111)
Creatinine, Ser: 1.46 mg/dL — ABNORMAL HIGH (ref 0.61–1.24)
GFR, Estimated: 50 mL/min — ABNORMAL LOW (ref >60)
Glucose, Bld: 131 mg/dL — ABNORMAL HIGH (ref 70–99)
Potassium: 3.7 mmol/L (ref 3.5–5.1)
Sodium: 134 mmol/L — ABNORMAL LOW (ref 135–145)

## 2020-11-10 LAB — BRAIN NATRIURETIC PEPTIDE: B Natriuretic Peptide: 167.2 pg/mL — ABNORMAL HIGH (ref 0.0–100.0)

## 2020-11-10 LAB — TROPONIN I (HIGH SENSITIVITY)
Troponin I (High Sensitivity): 17 ng/L (ref <18)
Troponin I (High Sensitivity): 19 ng/L — ABNORMAL HIGH (ref <18)
Troponin I (High Sensitivity): 18 ng/L — ABNORMAL HIGH (ref <18)

## 2020-11-10 LAB — CBC
HCT: 32.9 % — ABNORMAL LOW (ref 39.0–52.0)
Hemoglobin: 10.4 g/dL — ABNORMAL LOW (ref 13.0–17.0)
MCH: 28.3 pg (ref 26.0–34.0)
MCHC: 31.6 g/dL (ref 30.0–36.0)
MCV: 89.4 fL (ref 80.0–100.0)
Platelets: 293 10*3/uL (ref 150–400)
RBC: 3.68 MIL/uL — ABNORMAL LOW (ref 4.22–5.81)
RDW: 15.7 % — ABNORMAL HIGH (ref 11.5–15.5)
WBC: 6.5 10*3/uL (ref 4.0–10.5)
nRBC: 0 % (ref 0.0–0.2)

## 2020-11-10 LAB — ETHANOL: Alcohol, Ethyl (B): 39 mg/dL — ABNORMAL HIGH (ref <10)

## 2020-11-10 IMAGING — CR DG CHEST 2V
2 series · 2 of 2 positions shown · non-contrast
Comparison: [DATE]

CLINICAL DATA: Chest pain

EXAM:
CHEST - 2 VIEW

[chest pa]
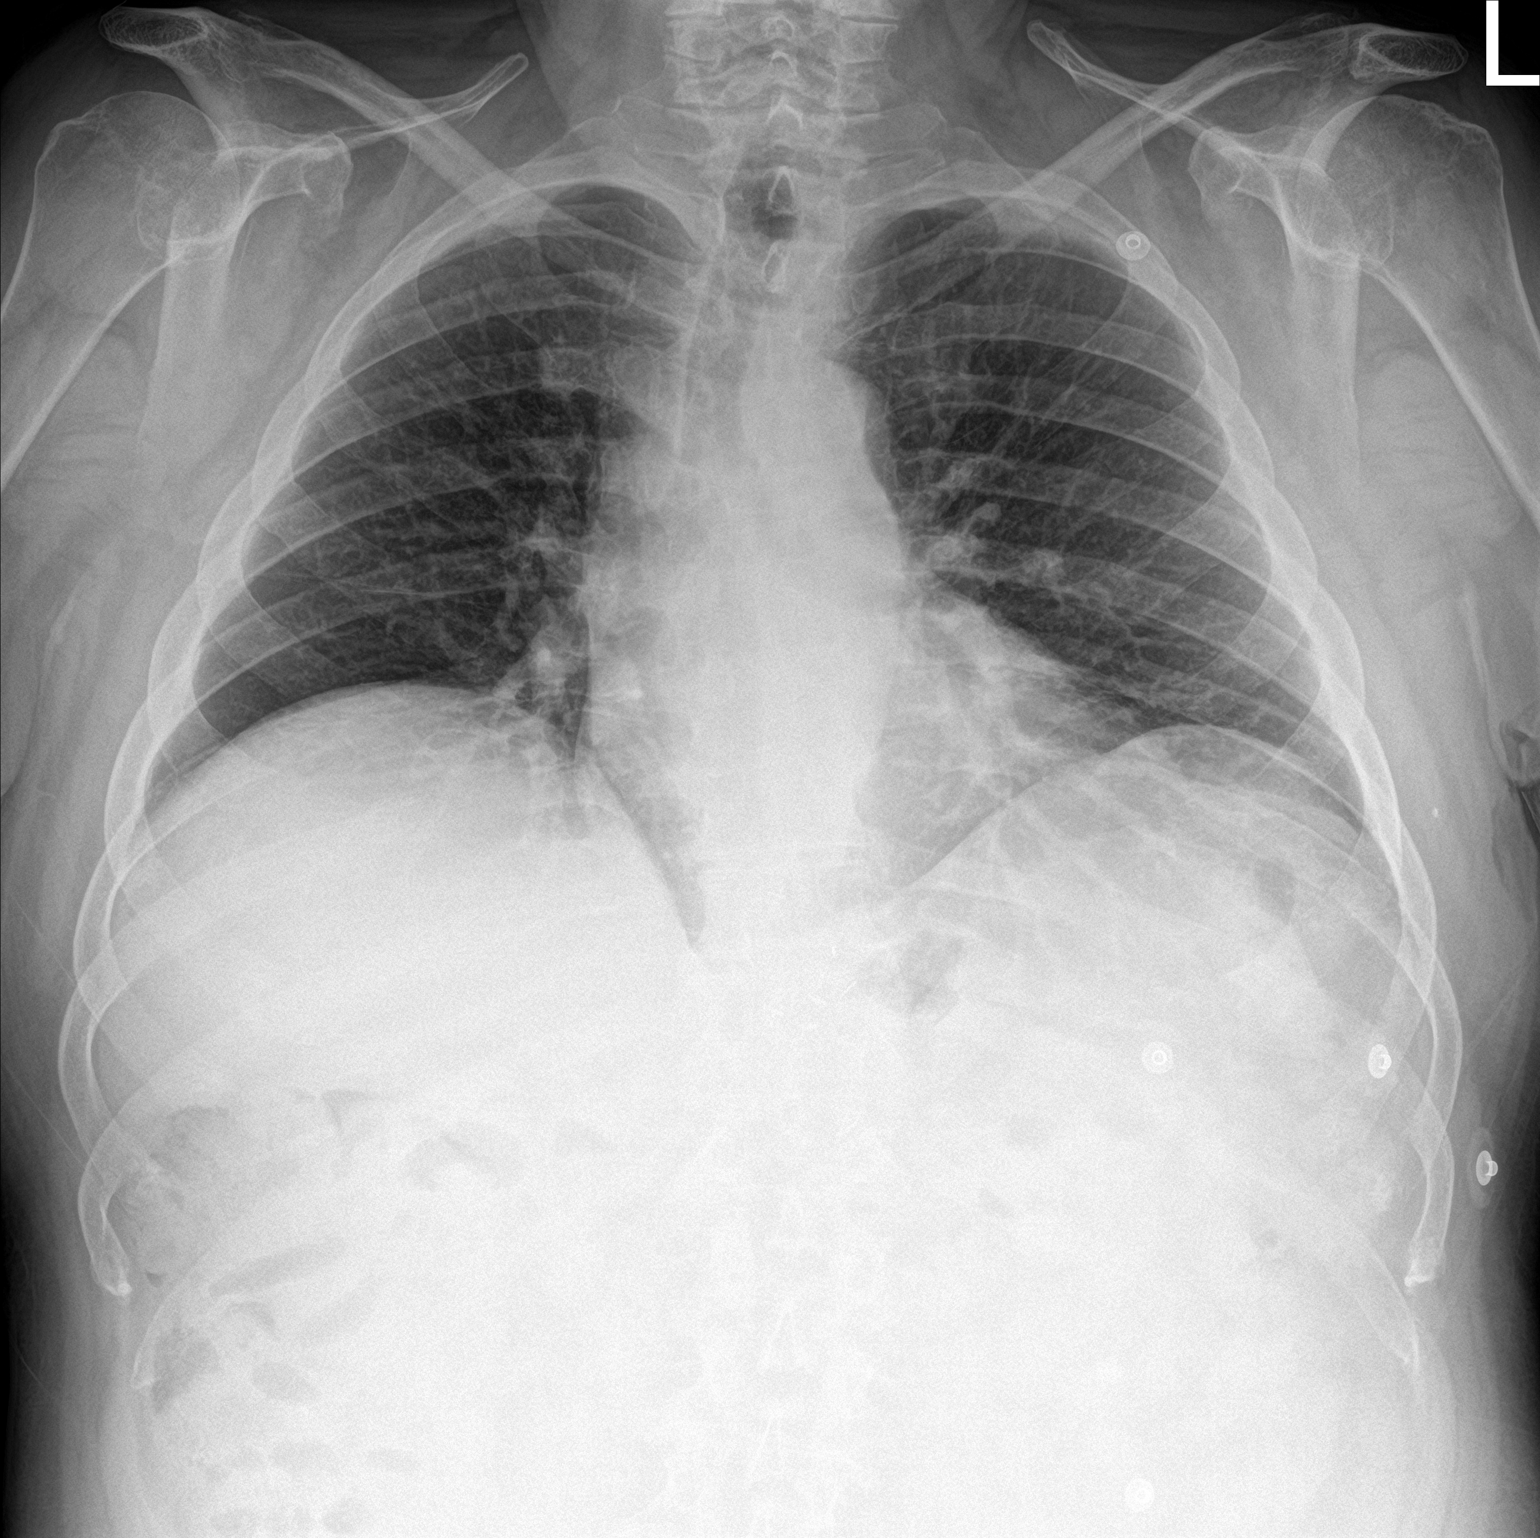

[chest lat]
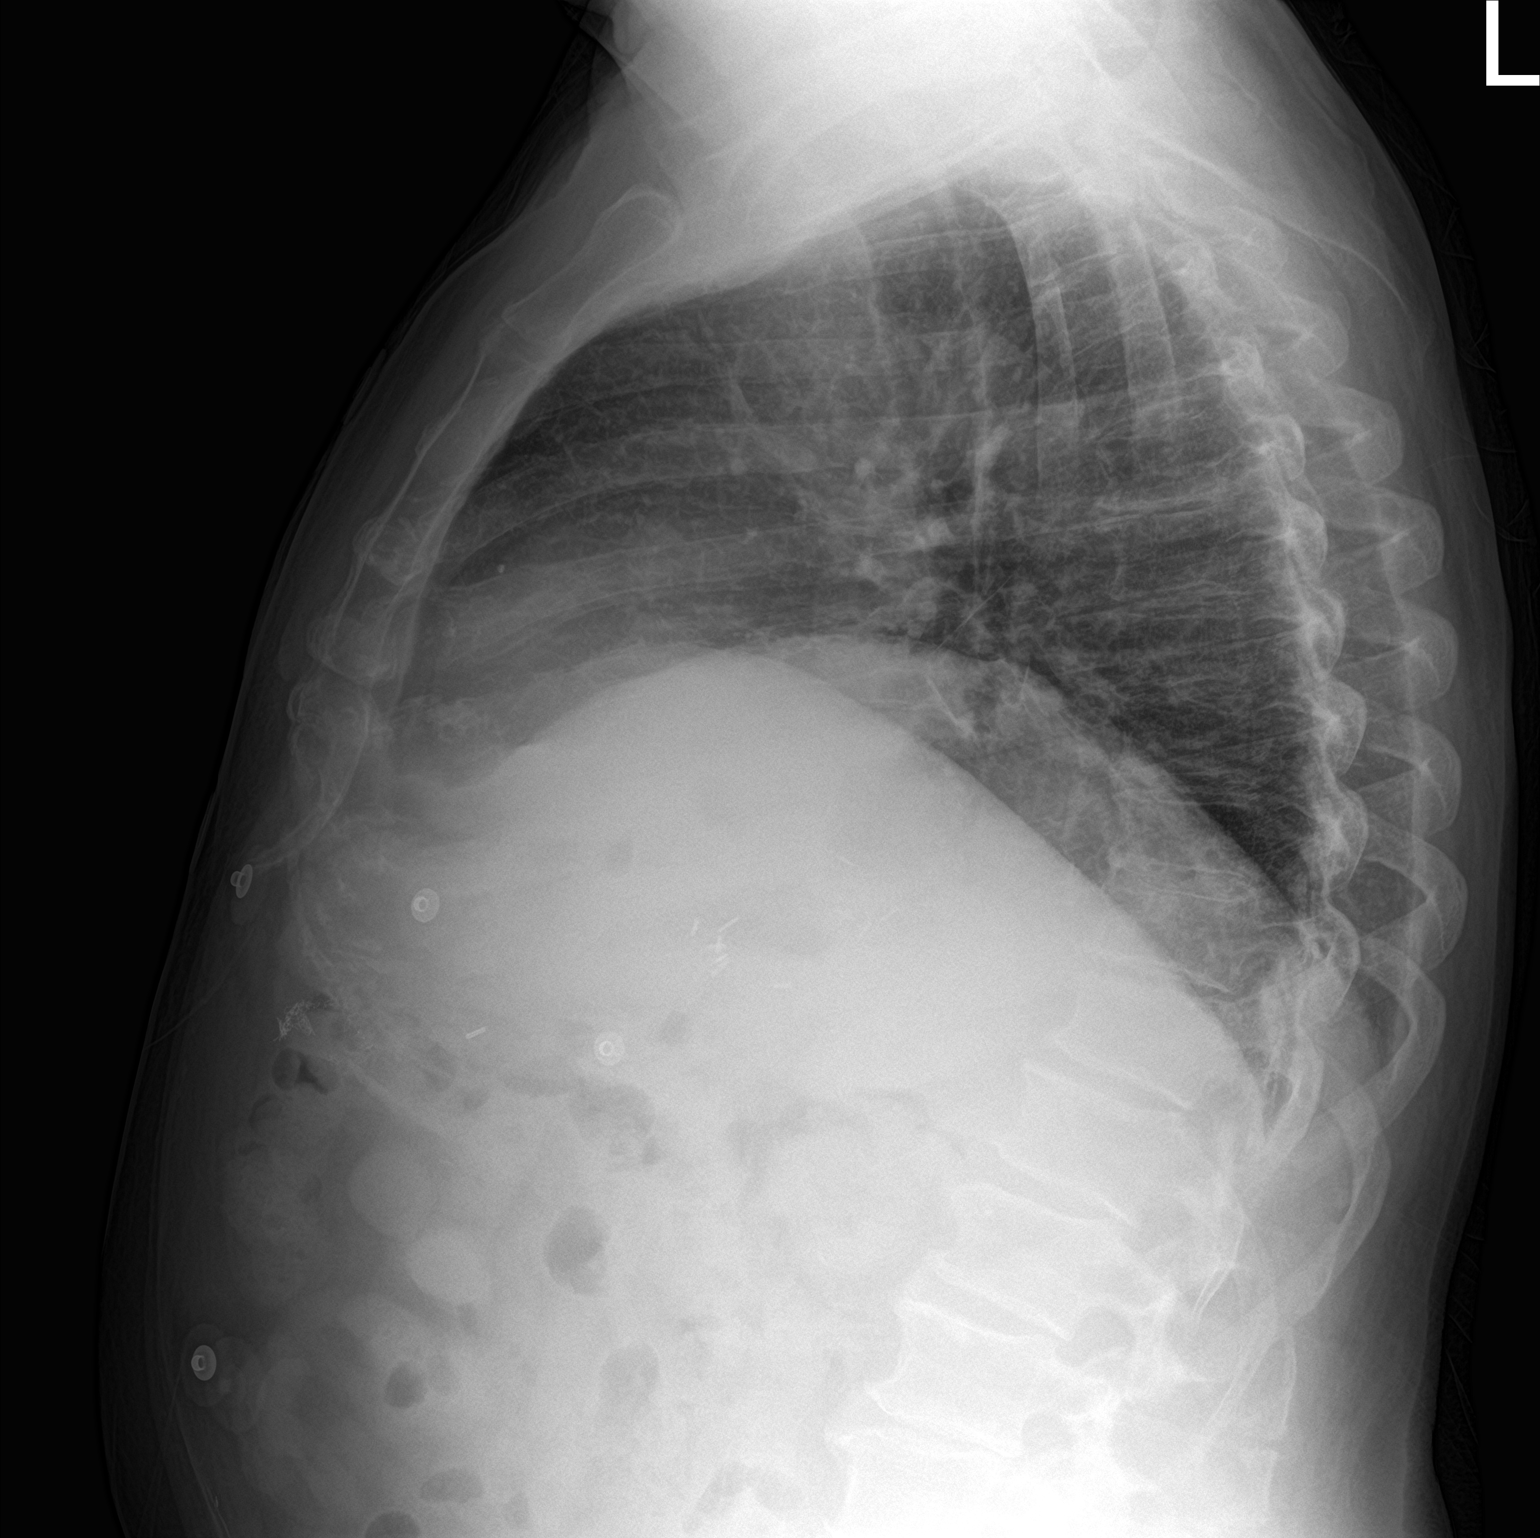

[2 of 2 positions shown; findings below may reference images not displayed]

FINDINGS: The heart size and mediastinal contours are within normal limits.
Both lungs are clear. Disc degenerative disease of the thoracic
spine.
IMPRESSION: No acute abnormality of the lungs.

## 2020-11-10 MED ORDER — CITALOPRAM HYDROBROMIDE 40 MG PO TABS
40.0000 mg | ORAL_TABLET | Freq: Every day | ORAL | Status: DC
  Administered 2020-11-11: 40 mg via ORAL
  Filled 2020-11-10: qty 1
  Filled 2020-11-10: qty 2

## 2020-11-10 MED ORDER — ACETAMINOPHEN 325 MG PO TABS
650.0000 mg | ORAL_TABLET | Freq: Four times a day (QID) | ORAL | Status: DC | PRN
  Administered 2020-11-15 – 2020-11-18 (×4): 650 mg via ORAL
  Filled 2020-11-10 (×6): qty 2

## 2020-11-10 MED ORDER — PANTOPRAZOLE SODIUM 40 MG PO TBEC
40.0000 mg | DELAYED_RELEASE_TABLET | Freq: Two times a day (BID) | ORAL | Status: DC
  Administered 2020-11-11 – 2020-11-12 (×4): 40 mg via ORAL
  Filled 2020-11-10 (×5): qty 1

## 2020-11-10 MED ORDER — LORAZEPAM 2 MG/ML IJ SOLN
1.0000 mg | INTRAMUSCULAR | Status: AC | PRN
Start: 2020-11-10 — End: 2020-11-13
  Administered 2020-11-11 (×3): 2 mg via INTRAVENOUS
  Administered 2020-11-11: 1 mg via INTRAVENOUS
  Administered 2020-11-11 (×2): 2 mg via INTRAVENOUS
  Administered 2020-11-11: 1 mg via INTRAVENOUS
  Administered 2020-11-12: 2 mg via INTRAVENOUS
  Administered 2020-11-12: 1 mg via INTRAVENOUS
  Administered 2020-11-12: 2 mg via INTRAVENOUS
  Administered 2020-11-13: 1 mg via INTRAVENOUS
  Administered 2020-11-13: 2 mg via INTRAVENOUS
  Administered 2020-11-13: 1 mg via INTRAVENOUS
  Administered 2020-11-13: 2 mg via INTRAVENOUS
  Filled 2020-11-10 (×7): qty 1
  Filled 2020-11-10: qty 2
  Filled 2020-11-10 (×8): qty 1

## 2020-11-10 MED ORDER — INSULIN ASPART 100 UNIT/ML IJ SOLN: 0.0000 [IU] | Freq: Every day | INTRAMUSCULAR | Status: DC

## 2020-11-10 MED ORDER — AMLODIPINE BESYLATE 10 MG PO TABS
10.0000 mg | ORAL_TABLET | Freq: Every day | ORAL | Status: DC
  Administered 2020-11-11 – 2020-11-12 (×2): 10 mg via ORAL
  Filled 2020-11-10 (×3): qty 1

## 2020-11-10 MED ORDER — THIAMINE HCL 100 MG/ML IJ SOLN
100.0000 mg | Freq: Every day | INTRAMUSCULAR | Status: DC
  Administered 2020-11-12 – 2020-11-15 (×3): 100 mg via INTRAVENOUS
  Filled 2020-11-10 (×6): qty 2

## 2020-11-10 MED ORDER — INSULIN ASPART 100 UNIT/ML IJ SOLN
0.0000 [IU] | Freq: Three times a day (TID) | INTRAMUSCULAR | Status: DC
  Administered 2020-11-11 – 2020-11-14 (×5): 1 [IU] via SUBCUTANEOUS
  Administered 2020-11-14: 2 [IU] via SUBCUTANEOUS
  Administered 2020-11-15 – 2020-11-17 (×4): 1 [IU] via SUBCUTANEOUS
  Administered 2020-11-18: 3 [IU] via SUBCUTANEOUS
  Administered 2020-11-18 – 2020-11-19 (×3): 1 [IU] via SUBCUTANEOUS

## 2020-11-10 MED ORDER — ADULT MULTIVITAMIN W/MINERALS CH
1.0000 | ORAL_TABLET | Freq: Every day | ORAL | Status: DC
  Administered 2020-11-11 – 2020-11-20 (×9): 1 via ORAL
  Filled 2020-11-10 (×10): qty 1

## 2020-11-10 MED ORDER — LORAZEPAM 1 MG PO TABS: 1.0000 mg | ORAL_TABLET | ORAL | Status: DC | PRN

## 2020-11-10 MED ORDER — ACETAMINOPHEN 650 MG RE SUPP: 650.0000 mg | Freq: Four times a day (QID) | RECTAL | Status: DC | PRN

## 2020-11-10 MED ORDER — FOLIC ACID 1 MG PO TABS
1.0000 mg | ORAL_TABLET | Freq: Every day | ORAL | Status: DC
  Administered 2020-11-11 – 2020-11-12 (×2): 1 mg via ORAL
  Filled 2020-11-10 (×3): qty 1

## 2020-11-10 MED ORDER — LORAZEPAM 2 MG/ML IJ SOLN
1.0000 mg | Freq: Once | INTRAMUSCULAR | Status: AC
  Administered 2020-11-10: 1 mg via INTRAVENOUS
  Filled 2020-11-10: qty 1

## 2020-11-10 MED ORDER — FUROSEMIDE 10 MG/ML IJ SOLN
20.0000 mg | Freq: Two times a day (BID) | INTRAMUSCULAR | Status: DC
  Administered 2020-11-11 (×3): 20 mg via INTRAVENOUS
  Filled 2020-11-10 (×3): qty 2

## 2020-11-10 MED ORDER — ALLOPURINOL 300 MG PO TABS
300.0000 mg | ORAL_TABLET | Freq: Every day | ORAL | Status: DC
  Administered 2020-11-11 – 2020-11-20 (×9): 300 mg via ORAL
  Filled 2020-11-10 (×10): qty 1

## 2020-11-10 MED ORDER — THIAMINE HCL 100 MG PO TABS
100.0000 mg | ORAL_TABLET | Freq: Every day | ORAL | Status: DC
  Administered 2020-11-11 – 2020-11-20 (×7): 100 mg via ORAL
  Filled 2020-11-10 (×8): qty 1

## 2020-11-10 MED ORDER — ENOXAPARIN SODIUM 40 MG/0.4ML IJ SOSY
40.0000 mg | PREFILLED_SYRINGE | INTRAMUSCULAR | Status: DC
  Administered 2020-11-11 – 2020-11-20 (×10): 40 mg via SUBCUTANEOUS
  Filled 2020-11-10 (×10): qty 0.4

## 2020-11-10 NOTE — ED Provider Notes (Signed)
Emergency Medicine Provider Triage Evaluation Note  Tyrone [name] , a 75 y.o. male  was evaluated in triage.  Pt complains of SOB and CP x 2 weeks.  Shortness of breath occurs with exertion.  Chest pains are intermittent and substernal.  Also has had lower extremity edema and swelling to his hands.  No longer uses alcohol, no known history of cirrhosis.  He has been having chills without fever or cough.  No abdominal pain.  Review of Systems  Positive: CP, SOB, LE edema, chills Negative: Fever, cough, abd pain  Physical Exam  BP 136/66 (BP Location: Left Arm)   Pulse 93   Temp 98.1 F (36.7 C)   Resp 18   SpO2 97%  Gen:   Awake, no distress   Resp:  Normal effort  MSK:   Moves extremities without difficulty  Other:  Lungs clear, 2+ pitting edema BLE, some swelling to dorsal hands  Medical Decision Making  Medically screening exam initiated at 6:06 PM.  Appropriate orders placed.  THOR NANNINI was informed that the remainder of the evaluation will be completed by another provider, this initial triage assessment does not replace that evaluation, and the importance of remaining in the ED until their evaluation is complete.  Labs, EKG, CXR ordered   Alie Hardgrove, [place] N, PA-C 11/10/20 1813    Charlesetta Shanks, MD 11/15/20 [phone]

## 2020-11-10 NOTE — ED Provider Notes (Signed)
[facility] Provider Note   CSN: [id number] Arrival date & time: 11/10/20  1710     History No chief complaint on file.   Tyrone [name] is a 75 y.o. male.  Patient is a 75 year old male with a history of hypertension and prior diagnosed diabetes with alcohol abuse who presents with chest pain and shortness of breath.  He said that his legs have been swollen for the last couple weeks.  He initially denied that he had swelling before in his legs but on chart review he was noted to have a prescription for Lasix and apparently he got this when his legs were recently swollen.  However he is not currently taking Lasix.  He has had some shortness of breath for a while but its been worse over the last couple days and today he had some chest pain when he was sitting in the lobby to the left side of his chest.  He denies he has had other episodes of chest pain although he is not a very good historian.  He states that he is not taking any of his medicines other than he thinks allopurinol.  He has stopped taking his metformin as well as his antihypertensive medications.  He initially told me that he had stopped drinking alcohol about a year ago but his wife did indicate that he still drinking and on further questioning he does indicate he still drinking although he will not tell me exactly when his last drink was.  He does seem a little tremulous and feels like he is a little anxious at the moment.  No known history of prior heart problems.      Past Medical History:  Diagnosis Date   Anemia    Arthritis    hands   Blood transfusion without reported diagnosis    for stomach ulcers   Depression    Hypertension    Kidney stone    Multiple duodenal ulcers     Patient Active Problem List   Diagnosis Date Noted   Degenerative lumbar spinal stenosis 08/03/2020   Hyperglycemia 04/05/2020   Steatohepatitis 10/04/2019   Subclinical hyperthyroidism 09/14/2019    Essential hypertension 07/26/2019   GERD (gastroesophageal reflux disease) 06/06/2019   Chronic left-sided lumbar radiculopathy 01/10/2019   Gout 12/03/2018   Left knee pain 12/03/2018   Senile purpura ([place]) 12/03/2018   B12 deficiency 07/20/2018   Fatigue 07/20/2018   Left foot drop 05/13/2018   Alcohol abuse 05/13/2018    Past Surgical History:  Procedure Laterality Date   ABDOMINAL SURGERY     ulcers   COLONOSCOPY     LITHOTRIPSY         Family History  Problem Relation Age of Onset   Colon cancer Neg Hx    Esophageal cancer Neg Hx    Rectal cancer Neg Hx    Stomach cancer Neg Hx     Social History   Tobacco Use   Smoking status: Former    Pack years: 0.00   Smokeless tobacco: Former    Types: Chew    Quit date: 05/30/2007  Substance Use Topics   Alcohol use: Not Currently    Alcohol/week: 0.0 standard drinks    Comment: sober 2.5 months   Drug use: No    Home Medications Prior to Admission medications   Medication Sig Start Date End Date Taking? Authorizing Provider  allopurinol (ZYLOPRIM) 300 MG tablet TAKE 1 TABLET BY MOUTH DAILY Patient taking differently: Take  300 mg by mouth daily. 03/01/20  Yes Vivi Barrack, MD  amLODipine (NORVASC) 10 MG tablet TAKE 1 TABLET BY MOUTH DAILY Patient taking differently: Take 10 mg by mouth daily. 07/31/20  Yes Vivi Barrack, MD  citalopram (CELEXA) 40 MG tablet Take 40 mg by mouth daily. 11/10/20  Yes [provider]  Cyanocobalamin (B-12) 5000 MCG CAPS Take by mouth 2 (two) times daily.   Yes [provider]  cyclobenzaprine (FLEXERIL) 10 MG tablet Take 1 tablet (10 mg total) by mouth 3 (three) times daily as needed for muscle spasms. 10/04/19  Yes Vivi Barrack, MD  folic acid (FOLVITE) 1 MG tablet Take 1 mg by mouth daily.   Yes [provider]  furosemide (LASIX) 20 MG tablet Take 1 tablet (20 mg total) by mouth daily as needed for edema. 10/30/20  Yes Maximiano Coss, NP  losartan  (COZAAR) 100 MG tablet Take 100 mg by mouth daily. 11/10/20  Yes [provider]  metFORMIN (GLUCOPHAGE) 500 MG tablet Take 500 mg by mouth daily. 11/10/20  Yes [provider]  naproxen sodium (ALEVE) 220 MG tablet Take 440 mg by mouth daily.   Yes [provider]  pantoprazole (PROTONIX) 40 MG tablet TAKE 1 TABLET BY MOUTH TWICE DAILY Patient taking differently: Take 40 mg by mouth 2 (two) times daily. 09/26/20  Yes Vivi Barrack, MD  triamcinolone 0.1%-Eucerin equivalent 1:1 cream mixture Apply 1 application topically 2 (two) times daily as needed for itching. 07/12/20  Yes [provider]  sulfamethoxazole-trimethoprim (BACTRIM DS) 800-160 MG tablet Take 1 tablet by mouth 2 (two) times daily. Patient not taking: No sig reported 10/30/20   Maximiano Coss, NP    Allergies    Patient has no known allergies.  Review of Systems   Review of Systems  Constitutional:  Positive for fatigue. Negative for chills, diaphoresis and fever.  HENT:  Negative for congestion, rhinorrhea and sneezing.   Eyes: Negative.   Respiratory:  Positive for shortness of breath. Negative for cough and chest tightness.   Cardiovascular:  Positive for chest pain and leg swelling.  Gastrointestinal:  Negative for abdominal pain, blood in stool, diarrhea, nausea and vomiting.  Genitourinary:  Negative for difficulty urinating, flank pain, frequency and hematuria.  Musculoskeletal:  Negative for arthralgias and back pain.  Skin:  Negative for rash.  Neurological:  Positive for tremors. Negative for dizziness, speech difficulty, weakness, numbness and headaches.  Psychiatric/Behavioral:  The patient is nervous/anxious.    Physical Exam Updated Vital Signs BP (!) 143/67 (BP Location: Left Arm)   Pulse (!) 102   Temp (!) 97.5 F (36.4 C)   Resp 20   SpO2 98%   Physical Exam Constitutional:      Appearance: He is well-developed.  HENT:     Head: Normocephalic and atraumatic.   Eyes:     Pupils: Pupils are equal, round, and reactive to light.  Cardiovascular:     Rate and Rhythm: Normal rate and regular rhythm.     Heart sounds: Normal heart sounds.  Pulmonary:     Effort: Pulmonary effort is normal. No respiratory distress.     Breath sounds: Normal breath sounds. No wheezing or rales.  Chest:     Chest wall: No tenderness.  Abdominal:     General: Bowel sounds are normal.     Palpations: Abdomen is soft.     Tenderness: There is no abdominal tenderness. There is no guarding or rebound.  Musculoskeletal:  General: Normal range of motion.     Cervical back: Normal range of motion and neck supple.     Comments: Slight tremor, 1+ pitting edema to the lower extremities bilaterally  Lymphadenopathy:     Cervical: No cervical adenopathy.  Skin:    General: Skin is warm and dry.     Findings: No rash.  Neurological:     Mental Status: He is alert and oriented to person, place, and time.    ED Results / Procedures / Treatments   Labs (all labs ordered are listed, but only abnormal results are displayed) Labs Reviewed  BASIC METABOLIC PANEL - Abnormal; Notable for the following components:      Result Value   Sodium 134 (*)    Chloride 96 (*)    Glucose, Bld 131 (*)    Creatinine, Ser 1.46 (*)    GFR, Estimated 50 (*)    All other components within normal limits  CBC - Abnormal; Notable for the following components:   RBC 3.68 (*)    Hemoglobin 10.4 (*)    HCT 32.9 (*)    RDW 15.7 (*)    All other components within normal limits  BRAIN NATRIURETIC PEPTIDE - Abnormal; Notable for the following components:   B Natriuretic Peptide 167.2 (*)    All other components within normal limits  TROPONIN I (HIGH SENSITIVITY) - Abnormal; Notable for the following components:   Troponin I (High Sensitivity) 19 (*)    All other components within normal limits  SARS CORONAVIRUS 2 (TAT 6-24 HRS)  HEPATIC FUNCTION PANEL  ETHANOL  TROPONIN I (HIGH  SENSITIVITY)  TROPONIN I (HIGH SENSITIVITY)    EKG EKG Interpretation  Date/Time:  Saturday November 10 2020 18:02:48 EDT Ventricular Rate:  91 PR Interval:  166 QRS Duration: 118 QT Interval:  392 QTC Calculation: 482 R Axis:   -51 Text Interpretation: Normal sinus rhythm Left anterior fascicular block Left ventricular hypertrophy with QRS widening and repolarization abnormality ( R in aVL , Cornell product , Romhilt-Estes ) Prolonged QT Abnormal ECG agree, similar to prevous no STEMI Confirmed by Charlesetta Shanks [phone]) on 11/10/2020 6:07:39 PM  Radiology DG Chest 2 View  Result Date: 11/10/2020 CLINICAL DATA:  Chest pain EXAM: CHEST - 2 VIEW COMPARISON:  10/11/2017 FINDINGS: The heart size and mediastinal contours are within normal limits. Both lungs are clear. Disc degenerative disease of the thoracic spine. IMPRESSION: No acute abnormality of the lungs. Electronically Signed   By: Eddie Candle M.D.   On: 11/10/2020 19:05    Procedures Procedures   Medications Ordered in ED Medications  LORazepam (ATIVAN) injection 1 mg (1 mg Intravenous Given 11/10/20 2244)    ED Course  I have reviewed the triage vital signs and the nursing notes.  Pertinent labs & imaging results that were available during my care of the patient were reviewed by me and considered in my medical decision making (see chart for details).    MDM Rules/Calculators/A&P                          Patient is a 75 year old male who presents with chest pain and shortness of breath with some leg swelling.  His chest x-ray is clear without evidence of pulmonary edema.  No pneumonia.  His EKG does not show any ischemic changes.  His troponin initially was normal but his second 1 was slightly elevated.  He also seems to be having a little bit  of alcohol withdrawal with some mild tachycardia and tremor.  He was given a dose of Ativan.  I spoke with cardiology (Dr. Quentin Ore) regarding his elevated troponin and he felt medicine  admission would be more appropriate.  I spoke with Dr. Marlowe Sax who will admit the pt. he is chest pain-free currently. Final Clinical Impression(s) / ED Diagnoses Final diagnoses:  Chest pain, unspecified type  Alcohol abuse    Rx / DC Orders ED Discharge Orders     None        Malvin Johns, MD 11/10/20 2316

## 2020-11-10 NOTE — H&P (Signed)
History and Physical    Tyrone [name] GDJ:[id number] DOB: [birth year] DOA: 11/10/2020  PCP: Vivi Barrack, MD Patient coming from: Home  Chief Complaint: Chest pain, shortness of breath  HPI: Tyrone [name] is a 75 y.o. male with medical history significant of hypertension, type II diabetes, gout, depression, GERD, alcohol abuse presented to the ED with complaints of chest pain, dyspnea on exertion, and bilateral lower extremity edema.  In the ED, he was slightly tachycardic but not febrile or hypoxic.  Labs showing WBC 6.5, hemoglobin 10.4 (no significant change from baseline), platelet count 293K.  Sodium 134, potassium 3.7, chloride 96, bicarb 23, BUN 8, creatinine 1.4 (baseline 0.9-1.3), glucose 131.  High-sensitivity troponin 17>19. EKG without acute ischemic changes.  LFTs normal.  BNP 167.  Blood ethanol level 39.  SARS-CoV-2 PCR test pending.  Chest x-ray negative for acute finding. Patient was given Ativan 1 mg.  Patient reports 1 month history of dyspnea on exertion, orthopnea, and bilateral lower extremity edema.  States his doctor recently prescribed him Lasix and it is helping with the swelling in his legs.  This morning he felt some left-sided  "throbbing" chest pain at rest without associated dyspnea, diaphoresis, or nausea.  Denies history of exertional chest pain.  Denies any cough or fevers.  Denies history of CAD or CHF.  Denies vomiting or abdominal pain.  Reports history of heavy daily alcohol use, drinks up to 7 or 8 beers daily.  States his last drink was a week ago.  Review of Systems:  All systems reviewed and apart from history of presenting illness, are negative.  Past Medical History:  Diagnosis Date   Anemia    Arthritis    hands   Blood transfusion without reported diagnosis    for stomach ulcers   Depression    Hypertension    Kidney stone    Multiple duodenal ulcers     Past Surgical History:  Procedure Laterality Date   ABDOMINAL SURGERY      ulcers   COLONOSCOPY     LITHOTRIPSY       reports that he has quit smoking. He quit smokeless tobacco use about 13 years ago.  His smokeless tobacco use included chew. He reports previous alcohol use. He reports that he does not use drugs.  No Known Allergies  Family History  Problem Relation Age of Onset   Colon cancer Neg Hx    Esophageal cancer Neg Hx    Rectal cancer Neg Hx    Stomach cancer Neg Hx     Prior to Admission medications   Medication Sig Start Date End Date Taking? Authorizing Provider  allopurinol (ZYLOPRIM) 300 MG tablet TAKE 1 TABLET BY MOUTH DAILY Patient taking differently: Take 300 mg by mouth daily. 03/01/20  Yes Vivi Barrack, MD  amLODipine (NORVASC) 10 MG tablet TAKE 1 TABLET BY MOUTH DAILY Patient taking differently: Take 10 mg by mouth daily. 07/31/20  Yes Vivi Barrack, MD  citalopram (CELEXA) 40 MG tablet Take 40 mg by mouth daily. 11/10/20  Yes [provider]  Cyanocobalamin (B-12) 5000 MCG CAPS Take by mouth 2 (two) times daily.   Yes [provider]  cyclobenzaprine (FLEXERIL) 10 MG tablet Take 1 tablet (10 mg total) by mouth 3 (three) times daily as needed for muscle spasms. 10/04/19  Yes Vivi Barrack, MD  folic acid (FOLVITE) 1 MG tablet Take 1 mg by mouth daily.   Yes [provider]  furosemide (LASIX)  20 MG tablet Take 1 tablet (20 mg total) by mouth daily as needed for edema. 10/30/20  Yes Maximiano Coss, NP  losartan (COZAAR) 100 MG tablet Take 100 mg by mouth daily. 11/10/20  Yes [provider]  metFORMIN (GLUCOPHAGE) 500 MG tablet Take 500 mg by mouth daily. 11/10/20  Yes [provider]  naproxen sodium (ALEVE) 220 MG tablet Take 440 mg by mouth daily.   Yes [provider]  pantoprazole (PROTONIX) 40 MG tablet TAKE 1 TABLET BY MOUTH TWICE DAILY Patient taking differently: Take 40 mg by mouth 2 (two) times daily. 09/26/20  Yes Vivi Barrack, MD  triamcinolone 0.1%-Eucerin equivalent  1:1 cream mixture Apply 1 application topically 2 (two) times daily as needed for itching. 07/12/20  Yes [provider]  sulfamethoxazole-trimethoprim (BACTRIM DS) 800-160 MG tablet Take 1 tablet by mouth 2 (two) times daily. Patient not taking: No sig reported 10/30/20   Maximiano Coss, NP    Physical Exam: Vitals:   11/10/20 1718 11/10/20 1935 11/10/20 2026  BP: 136/66 (!) 157/81 (!) 143/67  Pulse: 93 98 (!) 102  Resp: 18 20 20   Temp: 98.1 F (36.7 C) (!) 97.5 F (36.4 C)   SpO2: 97% 94% 98%    Physical Exam Constitutional:      General: He is not in acute distress. HENT:     Head: Normocephalic and atraumatic.  Eyes:     Extraocular Movements: Extraocular movements intact.     Conjunctiva/sclera: Conjunctivae normal.  Cardiovascular:     Rate and Rhythm: Normal rate and regular rhythm.     Pulses: Normal pulses.  Pulmonary:     Effort: Pulmonary effort is normal. No respiratory distress.     Breath sounds: Normal breath sounds. No wheezing or rales.  Abdominal:     General: Bowel sounds are normal. There is no distension.     Palpations: Abdomen is soft.     Tenderness: There is no abdominal tenderness.  Musculoskeletal:     Cervical back: Normal range of motion and neck supple.     Right lower leg: Edema present.     Left lower leg: Edema present.  Skin:    General: Skin is warm and dry.  Neurological:     General: No focal deficit present.     Mental Status: He is alert and oriented to person, place, and time.     Labs on Admission: I have personally reviewed following labs and imaging studies  CBC: Recent Labs  Lab 11/10/20 1801  WBC 6.5  HGB 10.4*  HCT 32.9*  MCV 89.4  PLT 678   Basic Metabolic Panel: Recent Labs  Lab 11/10/20 1801  NA 134*  K 3.7  CL 96*  CO2 23  GLUCOSE 131*  BUN 8  CREATININE 1.46*  CALCIUM 9.0   GFR: CrCl cannot be calculated (Unknown ideal weight.). Liver Function Tests: Recent Labs  Lab 11/10/20 1801   AST 27  ALT 24  ALKPHOS 88  BILITOT 1.1  PROT 7.4  ALBUMIN 4.1   No results for input(s): LIPASE, AMYLASE in the last 168 hours. No results for input(s): AMMONIA in the last 168 hours. Coagulation Profile: No results for input(s): INR, PROTIME in the last 168 hours. Cardiac Enzymes: No results for input(s): CKTOTAL, CKMB, CKMBINDEX, TROPONINI in the last 168 hours. BNP (last 3 results) No results for input(s): PROBNP in the last 8760 hours. HbA1C: No results for input(s): HGBA1C in the last 72 hours. CBG: No results  for input(s): GLUCAP in the last 168 hours. Lipid Profile: No results for input(s): CHOL, HDL, LDLCALC, TRIG, CHOLHDL, LDLDIRECT in the last 72 hours. Thyroid Function Tests: No results for input(s): TSH, T4TOTAL, FREET4, T3FREE, THYROIDAB in the last 72 hours. Anemia Panel: No results for input(s): VITAMINB12, FOLATE, FERRITIN, TIBC, IRON, RETICCTPCT in the last 72 hours. Urine analysis:    Component Value Date/Time   COLORURINE YELLOW 10/11/2017 1736   APPEARANCEUR CLOUDY (A) 10/11/2017 1736   LABSPEC 1.023 10/11/2017 1736   PHURINE 5.0 10/11/2017 1736   GLUCOSEU NEGATIVE 10/11/2017 1736   HGBUR MODERATE (A) 10/11/2017 1736   BILIRUBINUR NEGATIVE 10/11/2017 1736   KETONESUR NEGATIVE 10/11/2017 1736   PROTEINUR 100 (A) 10/11/2017 1736   UROBILINOGEN 0.2 05/13/2014 1647   NITRITE POSITIVE (A) 10/11/2017 1736   LEUKOCYTESUR MODERATE (A) 10/11/2017 1736    Radiological Exams on Admission: DG Chest 2 View  Result Date: 11/10/2020 CLINICAL DATA:  Chest pain EXAM: CHEST - 2 VIEW COMPARISON:  10/11/2017 FINDINGS: The heart size and mediastinal contours are within normal limits. Both lungs are clear. Disc degenerative disease of the thoracic spine. IMPRESSION: No acute abnormality of the lungs. Electronically Signed   By: Eddie Candle M.D.   On: 11/10/2020 19:05    EKG: Independently reviewed.  Sinus rhythm, LAFB, LVH.  No acute ischemic  changes.  Assessment/Plan Principal Problem:   Volume overload Active Problems:   Gout   GERD (gastroesophageal reflux disease)   HTN (hypertension)   Chest pain   Volume overload/ suspected CHF Patient is presenting with 1 month history of dyspnea on exertion, orthopnea, and bilateral lower extremity edema.  Appears volume overloaded on exam with peripheral edema.  BNP 167.  Chest x-ray clear.  No documented history of CHF or prior echo results in the chart.  PE less likely given no hypoxia. -Cardiac monitoring.  Start IV Lasix 20 mg twice daily.  Echocardiogram ordered.  Monitor intake and output, daily weights.  Low-sodium diet with fluid restriction.  Check D-dimer level.   Chest pain High sensitive troponin borderline elevated 17 >19.  EKG without acute ischemic changes.  Currently chest pain-free and appears comfortable. -Cardiac monitoring.  Trend troponin.  Echocardiogram ordered.  Hypertension Stable. -Continue home amlodipine  Noninsulin-dependent type 2 diabetes -Check A1c.  Sliding scale insulin sensitive ACHS.   Gout -Continue allopurinol  Depression -Continue Celexa  Alcohol abuse LFTs normal.  No signs of withdrawal at this time. -CIWA protocol; Ativan as needed.  Thiamine, folate, and multivitamin.  Check mag and Phos levels.  GERD -Continue Protonix  DVT prophylaxis: Lovenox Code Status: Patient wishes to be full code. Family Communication: No family available at this time. Disposition Plan: Status is: Observation  The patient remains OBS appropriate and will d/c before 2 midnights.  Dispo: The patient is from: Home              Anticipated d/c is to: Home              Patient currently is not medically stable to d/c.   Difficult to place patient No  Level of care: Cardiac telemetry  The medical decision making on this patient was of high complexity and the patient is at high risk for clinical deterioration, therefore this is a level 3  visit.  Shela Leff MD [facility]  If 7PM-7AM, please contact night-coverage [email or web address]  11/11/2020, 12:15 AM

## 2020-11-10 NOTE — ED Triage Notes (Addendum)
Pt reports swelling to hands, legs, and feet x 2 weeks.  Reports intermittent L sided chest pain and SOB.  Seen by an urgent care today and told to come to ED for possible CHF.  Denies chest pain at present.

## 2020-11-11 ENCOUNTER — Inpatient Hospital Stay (HOSPITAL_COMMUNITY): Payer: Medicare Other

## 2020-11-11 ENCOUNTER — Observation Stay (HOSPITAL_COMMUNITY): Payer: Medicare Other

## 2020-11-11 DIAGNOSIS — J320 Chronic maxillary sinusitis: Secondary | ICD-10-CM | POA: Diagnosis not present

## 2020-11-11 DIAGNOSIS — R410 Disorientation, unspecified: Secondary | ICD-10-CM | POA: Diagnosis not present

## 2020-11-11 DIAGNOSIS — K219 Gastro-esophageal reflux disease without esophagitis: Secondary | ICD-10-CM | POA: Diagnosis present

## 2020-11-11 DIAGNOSIS — I5021 Acute systolic (congestive) heart failure: Secondary | ICD-10-CM

## 2020-11-11 DIAGNOSIS — R062 Wheezing: Secondary | ICD-10-CM | POA: Diagnosis not present

## 2020-11-11 DIAGNOSIS — E119 Type 2 diabetes mellitus without complications: Secondary | ICD-10-CM | POA: Diagnosis present

## 2020-11-11 DIAGNOSIS — E8770 Fluid overload, unspecified: Secondary | ICD-10-CM | POA: Diagnosis not present

## 2020-11-11 DIAGNOSIS — Z87891 Personal history of nicotine dependence: Secondary | ICD-10-CM | POA: Diagnosis not present

## 2020-11-11 DIAGNOSIS — D509 Iron deficiency anemia, unspecified: Secondary | ICD-10-CM | POA: Diagnosis present

## 2020-11-11 DIAGNOSIS — E669 Obesity, unspecified: Secondary | ICD-10-CM | POA: Diagnosis present

## 2020-11-11 DIAGNOSIS — I6782 Cerebral ischemia: Secondary | ICD-10-CM | POA: Diagnosis not present

## 2020-11-11 DIAGNOSIS — M109 Gout, unspecified: Secondary | ICD-10-CM | POA: Diagnosis present

## 2020-11-11 DIAGNOSIS — Z791 Long term (current) use of non-steroidal anti-inflammatories (NSAID): Secondary | ICD-10-CM | POA: Diagnosis not present

## 2020-11-11 DIAGNOSIS — F10229 Alcohol dependence with intoxication, unspecified: Secondary | ICD-10-CM | POA: Diagnosis present

## 2020-11-11 DIAGNOSIS — F10231 Alcohol dependence with withdrawal delirium: Secondary | ICD-10-CM | POA: Diagnosis present

## 2020-11-11 DIAGNOSIS — W06XXXA Fall from bed, initial encounter: Secondary | ICD-10-CM | POA: Diagnosis not present

## 2020-11-11 DIAGNOSIS — R0609 Other forms of dyspnea: Secondary | ICD-10-CM | POA: Diagnosis not present

## 2020-11-11 DIAGNOSIS — Z79890 Hormone replacement therapy (postmenopausal): Secondary | ICD-10-CM | POA: Diagnosis not present

## 2020-11-11 DIAGNOSIS — S0990XA Unspecified injury of head, initial encounter: Secondary | ICD-10-CM | POA: Diagnosis not present

## 2020-11-11 DIAGNOSIS — Z20822 Contact with and (suspected) exposure to covid-19: Secondary | ICD-10-CM | POA: Diagnosis present

## 2020-11-11 DIAGNOSIS — N179 Acute kidney failure, unspecified: Secondary | ICD-10-CM | POA: Diagnosis not present

## 2020-11-11 DIAGNOSIS — Y92239 Unspecified place in hospital as the place of occurrence of the external cause: Secondary | ICD-10-CM | POA: Diagnosis not present

## 2020-11-11 DIAGNOSIS — I444 Left anterior fascicular block: Secondary | ICD-10-CM | POA: Diagnosis present

## 2020-11-11 DIAGNOSIS — Z6830 Body mass index (BMI) 30.0-30.9, adult: Secondary | ICD-10-CM | POA: Diagnosis not present

## 2020-11-11 DIAGNOSIS — I672 Cerebral atherosclerosis: Secondary | ICD-10-CM | POA: Diagnosis not present

## 2020-11-11 DIAGNOSIS — J9811 Atelectasis: Secondary | ICD-10-CM | POA: Diagnosis not present

## 2020-11-11 DIAGNOSIS — G9340 Encephalopathy, unspecified: Secondary | ICD-10-CM | POA: Diagnosis not present

## 2020-11-11 DIAGNOSIS — Y901 Blood alcohol level of 20-39 mg/100 ml: Secondary | ICD-10-CM | POA: Diagnosis present

## 2020-11-11 DIAGNOSIS — S01112A Laceration without foreign body of left eyelid and periocular area, initial encounter: Secondary | ICD-10-CM | POA: Diagnosis not present

## 2020-11-11 DIAGNOSIS — R7989 Other specified abnormal findings of blood chemistry: Secondary | ICD-10-CM | POA: Diagnosis present

## 2020-11-11 DIAGNOSIS — G928 Other toxic encephalopathy: Secondary | ICD-10-CM | POA: Diagnosis present

## 2020-11-11 DIAGNOSIS — Z7984 Long term (current) use of oral hypoglycemic drugs: Secondary | ICD-10-CM | POA: Diagnosis not present

## 2020-11-11 DIAGNOSIS — F1010 Alcohol abuse, uncomplicated: Secondary | ICD-10-CM | POA: Diagnosis present

## 2020-11-11 DIAGNOSIS — E876 Hypokalemia: Secondary | ICD-10-CM | POA: Diagnosis not present

## 2020-11-11 DIAGNOSIS — Z79899 Other long term (current) drug therapy: Secondary | ICD-10-CM | POA: Diagnosis not present

## 2020-11-11 DIAGNOSIS — F32A Depression, unspecified: Secondary | ICD-10-CM | POA: Diagnosis present

## 2020-11-11 DIAGNOSIS — E871 Hypo-osmolality and hyponatremia: Secondary | ICD-10-CM | POA: Diagnosis not present

## 2020-11-11 DIAGNOSIS — I110 Hypertensive heart disease with heart failure: Secondary | ICD-10-CM | POA: Diagnosis present

## 2020-11-11 DIAGNOSIS — R220 Localized swelling, mass and lump, head: Secondary | ICD-10-CM | POA: Diagnosis not present

## 2020-11-11 LAB — POCT I-STAT 7, (LYTES, BLD GAS, ICA,H+H)
Acid-Base Excess: 6 mmol/L — ABNORMAL HIGH (ref 0.0–2.0)
Bicarbonate: 33.8 mmol/L — ABNORMAL HIGH (ref 20.0–28.0)
Calcium, Ion: 1.19 mmol/L (ref 1.15–1.40)
HCT: 29 % — ABNORMAL LOW (ref 39.0–52.0)
Hemoglobin: 9.9 g/dL — ABNORMAL LOW (ref 13.0–17.0)
O2 Saturation: 94 %
Patient temperature: 97.7
Potassium: 3.8 mmol/L (ref 3.5–5.1)
Sodium: 136 mmol/L (ref 135–145)
TCO2: 36 mmol/L — ABNORMAL HIGH (ref 22–32)
pCO2 arterial: 66 mmHg (ref 32.0–48.0)
pH, Arterial: 7.314 — ABNORMAL LOW (ref 7.350–7.450)
pO2, Arterial: 77 mmHg — ABNORMAL LOW (ref 83.0–108.0)

## 2020-11-11 LAB — ECHOCARDIOGRAM COMPLETE
Height: 69 in
S' Lateral: 3 cm
Weight: 3171.2 oz

## 2020-11-11 LAB — GLUCOSE, CAPILLARY
Glucose-Capillary: 135 mg/dL — ABNORMAL HIGH (ref 70–99)
Glucose-Capillary: 125 mg/dL — ABNORMAL HIGH (ref 70–99)
Glucose-Capillary: 120 mg/dL — ABNORMAL HIGH (ref 70–99)
Glucose-Capillary: 125 mg/dL — ABNORMAL HIGH (ref 70–99)
Glucose-Capillary: 132 mg/dL — ABNORMAL HIGH (ref 70–99)

## 2020-11-11 LAB — MAGNESIUM: Magnesium: 2 mg/dL (ref 1.7–2.4)

## 2020-11-11 LAB — SARS CORONAVIRUS 2 (TAT 6-24 HRS): SARS Coronavirus 2: NEGATIVE

## 2020-11-11 LAB — CBG MONITORING, ED: Glucose-Capillary: 146 mg/dL — ABNORMAL HIGH (ref 70–99)

## 2020-11-11 LAB — D-DIMER, QUANTITATIVE: D-Dimer, Quant: 1.02 ug/mL-FEU — ABNORMAL HIGH (ref 0.00–0.50)

## 2020-11-11 LAB — MRSA NEXT GEN BY PCR, NASAL: MRSA by PCR Next Gen: NOT DETECTED

## 2020-11-11 LAB — PHOSPHORUS: Phosphorus: 3.7 mg/dL (ref 2.5–4.6)

## 2020-11-11 IMAGING — DX DG CHEST 1V PORT
1 series · 1 of 1 positions shown · non-contrast
Comparison: [DATE]

CLINICAL DATA: Wheezing

EXAM:
PORTABLE CHEST 1 VIEW

[chest ap]
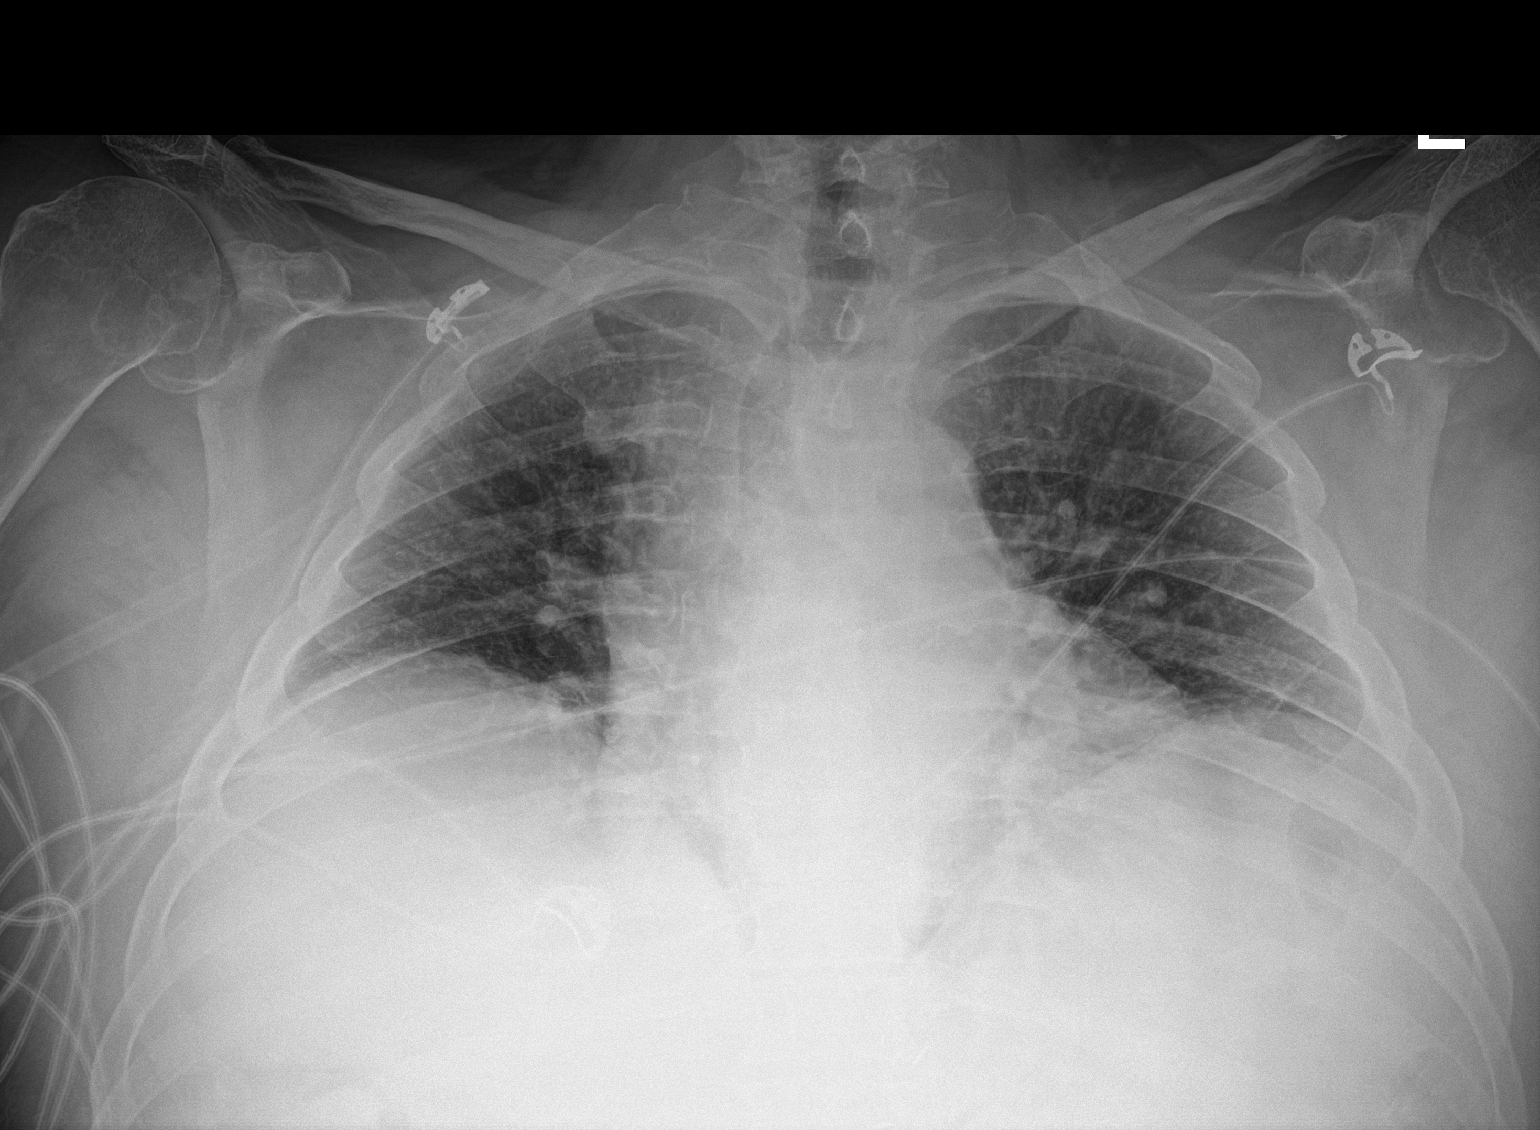

[1 of 1 positions shown; findings below may reference images not displayed]

FINDINGS: Very low lung volumes. Bibasilar atelectasis. Heart is normal size.
No effusions. No acute bony abnormality.
IMPRESSION: Low lung volumes with bibasilar atelectasis.

## 2020-11-11 MED ORDER — ZIPRASIDONE MESYLATE 20 MG IM SOLR: 10.0000 mg | Freq: Once | INTRAMUSCULAR | Status: DC

## 2020-11-11 MED ORDER — ALBUTEROL SULFATE (2.5 MG/3ML) 0.083% IN NEBU: 2.5000 mg | INHALATION_SOLUTION | RESPIRATORY_TRACT | Status: DC | PRN

## 2020-11-11 MED ORDER — DEXMEDETOMIDINE HCL IN NACL 400 MCG/100ML IV SOLN
0.4000 ug/kg/h | INTRAVENOUS | Status: DC
  Administered 2020-11-11: 0.3 ug/kg/h via INTRAVENOUS
  Administered 2020-11-11: 0.4 ug/kg/h via INTRAVENOUS
  Filled 2020-11-11: qty 200
  Filled 2020-11-11: qty 100

## 2020-11-11 MED ORDER — CHLORHEXIDINE GLUCONATE CLOTH 2 % EX PADS
6.0000 | MEDICATED_PAD | Freq: Every day | CUTANEOUS | Status: DC
  Administered 2020-11-11 – 2020-11-18 (×8): 6 via TOPICAL

## 2020-11-11 NOTE — Consult Note (Signed)
Name: Tyrone [name] MRN: [id number] DOB: 1945/12/20    ADMISSION DATE:  11/10/2020 CONSULTATION DATE:  11/11/2020   REFERRING MD :  Welford Roche   CHIEF COMPLAINT: Alcohol withdrawal, agitation  BRIEF PATIENT DESCRIPTION: 75 year old EtOH user admitted for dyspnea with bilateral lower extremity edema, PCCM consulted for EtOH withdrawal, received 6 mg of Ativan since 4 AM  SIGNIFICANT EVENTS  6/25 admission for dyspnea, suspected CHF  STUDIES:  Echo 6/26 normal LV function   HISTORY OF PRESENT ILLNESS: 75 year old heavy EtOH user, diabetic, hypertensive admitted overnight with 1 month history of dyspnea on exertion, bilateral lower extremity edema.  History is obtained from chart review since patient is unable to provide.  He is only oriented to name currently.  His EtOH level was 39 on admission, BNP was 167 chest x-ray was clear, no hypoxia .  He was given 1 mg of Ativan in the ED and admitted with a presumptive diagnosis of CHF. He required more doses of Ativan since 4 AM and has required 6 mg of Ativan in the last 6 hours.  PCCM consulted for increased agitation   PAST MEDICAL HISTORY :   has a past medical history of Anemia, Arthritis, Blood transfusion without reported diagnosis, Depression, Hypertension, Kidney stone, and Multiple duodenal ulcers.  has a past surgical history that includes Lithotripsy; Abdominal surgery; and Colonoscopy. Prior to Admission medications   Medication Sig Start Date End Date Taking? Authorizing Provider  allopurinol (ZYLOPRIM) 300 MG tablet TAKE 1 TABLET BY MOUTH DAILY Patient taking differently: Take 300 mg by mouth daily. 03/01/20  Yes Vivi Barrack, MD  amLODipine (NORVASC) 10 MG tablet TAKE 1 TABLET BY MOUTH DAILY Patient taking differently: Take 10 mg by mouth daily. 07/31/20  Yes Vivi Barrack, MD  citalopram (CELEXA) 40 MG tablet Take 40 mg by mouth daily. 11/10/20  Yes [provider]  Cyanocobalamin (B-12) 5000 MCG CAPS Take  by mouth 2 (two) times daily.   Yes [provider]  cyclobenzaprine (FLEXERIL) 10 MG tablet Take 1 tablet (10 mg total) by mouth 3 (three) times daily as needed for muscle spasms. 10/04/19  Yes Vivi Barrack, MD  folic acid (FOLVITE) 1 MG tablet Take 1 mg by mouth daily.   Yes [provider]  furosemide (LASIX) 20 MG tablet Take 1 tablet (20 mg total) by mouth daily as needed for edema. 10/30/20  Yes Maximiano Coss, NP  losartan (COZAAR) 100 MG tablet Take 100 mg by mouth daily. 11/10/20  Yes [provider]  metFORMIN (GLUCOPHAGE) 500 MG tablet Take 500 mg by mouth daily. 11/10/20  Yes [provider]  naproxen sodium (ALEVE) 220 MG tablet Take 440 mg by mouth daily.   Yes [provider]  pantoprazole (PROTONIX) 40 MG tablet TAKE 1 TABLET BY MOUTH TWICE DAILY Patient taking differently: Take 40 mg by mouth 2 (two) times daily. 09/26/20  Yes Vivi Barrack, MD  triamcinolone 0.1%-Eucerin equivalent 1:1 cream mixture Apply 1 application topically 2 (two) times daily as needed for itching. 07/12/20  Yes [provider]  sulfamethoxazole-trimethoprim (BACTRIM DS) 800-160 MG tablet Take 1 tablet by mouth 2 (two) times daily. Patient not taking: No sig reported 10/30/20   Maximiano Coss, NP   No Known Allergies  FAMILY HISTORY:  family history is not on file. SOCIAL HISTORY:  reports that he has quit smoking. He quit smokeless tobacco use about 13 years ago.  His smokeless tobacco use included chew. He reports previous  alcohol use. He reports that he does not use drugs.  REVIEW OF SYSTEMS:    Unable to obtain due to altered mental status  SUBJECTIVE:   VITAL SIGNS: Temp:  [97.5 F (36.4 C)-98.5 F (36.9 C)] 98.5 F (36.9 C) (06/26 0624) Pulse Rate:  [93-114] 101 (06/26 1042) Resp:  [17-22] 17 (06/26 0500) BP: (116-165)/(53-82) 138/65 (06/26 0846) SpO2:  [94 %-99 %] 99 % (06/26 0500) Weight:  [89.9 kg] 89.9 kg (06/26  0345)  PHYSICAL EXAMINATION: General: Middle-age man, in bilateral wrist restraints, moving around in bed Neuro: Unable to focus, oriented to name, not to place or time, grossly nonfocal HEENT: No pallor, no icterus, no JVD Cardiovascular: S1-S2 mild tacky, regular Lungs: Clear breath sounds bilateral, no rhonchi Abdomen: Soft, nontender abdomen Musculoskeletal: No deformity Skin: No edema, no rash  Recent Labs  Lab 11/10/20 1801  NA 134*  K 3.7  CL 96*  CO2 23  BUN 8  CREATININE 1.46*  GLUCOSE 131*   Recent Labs  Lab 11/10/20 1801  HGB 10.4*  HCT 32.9*  WBC 6.5  PLT 293   DG Chest 2 View  Result Date: 11/10/2020 CLINICAL DATA:  Chest pain EXAM: CHEST - 2 VIEW COMPARISON:  10/11/2017 FINDINGS: The heart size and mediastinal contours are within normal limits. Both lungs are clear. Disc degenerative disease of the thoracic spine. IMPRESSION: No acute abnormality of the lungs. Electronically Signed   By: Eddie Candle M.D.   On: 11/10/2020 19:05   ECHOCARDIOGRAM COMPLETE  Result Date: 11/11/2020    ECHOCARDIOGRAM REPORT   Patient Name:   Tyrone [name] Date of Exam: 11/11/2020 Medical Rec #:  [id number]         Height:       69.0 in Accession #:    [id number]        Weight:       198.2 lb Date of Birth:  27-Sep-1945         BSA:          2.058 m Patient Age:    23 years          BP:           116/71 mmHg Patient Gender: M                 HR:           99 bpm. Exam Location:  Inpatient Procedure: 2D Echo, Cardiac Doppler and Color Doppler Indications:    CHF - Acute systolic  History:        Patient has no prior history of Echocardiogram examinations.                 Signs/Symptoms:Chest Pain and Dyspnea; Risk Factors:Hypertension                 and Former Smoker. GERD. ETOH abuse.  Sonographer:    Clayton Lefort RDCS (AE) Referring Phys: [id number] [facility]  Sonographer Comments: Image acquisition challenging due to uncooperative patient. Unable to complete full echocardiogram due  to patient being uncooperative, repeatedly trying to get out of bed during exam. After first attempt nurse notified. Nurse gave medication, and I completed another echocardiogram. Upon returning to patient room to attempt to continue exam, patient with sitter still trying to get out of bed. IMPRESSIONS  1. The patient was not cooperative during the study and the study was unable to be completed. Grossly there is normal RV/LV function, but further assessment was not performed  due to the patient's inability to comply with the examination.  2. Left ventricular ejection fraction, by estimation, is 60 to 65%. The left ventricle has normal function.  3. Right ventricular systolic function is normal. The right ventricular size is normal.  4. The aortic valve is tricuspid. Aortic valve regurgitation is not visualized. No aortic stenosis is present. FINDINGS  Left Ventricle: Left ventricular ejection fraction, by estimation, is 60 to 65%. The left ventricle has normal function. The left ventricular internal cavity size was normal in size. There is no left ventricular hypertrophy. Right Ventricle: The right ventricular size is normal. No increase in right ventricular wall thickness. Right ventricular systolic function is normal. Pericardium: There is no evidence of pericardial effusion. Presence of pericardial fat pad. Tricuspid Valve: The tricuspid valve is grossly normal. Tricuspid valve regurgitation is trivial. No evidence of tricuspid stenosis. Aortic Valve: The aortic valve is tricuspid. Aortic valve regurgitation is not visualized. No aortic stenosis is present. Pulmonic Valve: The pulmonic valve was grossly normal. Pulmonic valve regurgitation is trivial. No evidence of pulmonic stenosis. Aorta: The aortic root and ascending aorta are structurally normal, with no evidence of dilitation.  LEFT VENTRICLE PLAX 2D LVIDd:         4.30 cm LVIDs:         3.00 cm LV PW:         0.92 cm LV IVS:        1.11 cm LVOT diam:      2.20 cm LVOT Area:     3.80 cm  LEFT ATRIUM         Index LA diam:    3.70 cm 1.80 cm/m   AORTA Ao Root diam: 3.80 cm Ao Asc diam:  3.70 cm  SHUNTS Systemic Diam: 2.20 cm Eleonore Chiquito MD Electronically signed by Eleonore Chiquito MD Signature Date/Time: 11/11/2020/10:00:23 AM    Final     ASSESSMENT / PLAN:  Alcohol withdrawal delirium  -While he is clearly withdrawing with potential to get worse, he does not seem to have received enough benzodiazepines.  He has required 6 mg in the last 6 hours and only been given 2 mg since 7 AM.   Would suggest that we dose him with 2 mg every 1 hour until we catch up and CIWA score is less than 5 -PCCM will reassess and if it seems that he has increasing requirements more than 10 mg all continues to be severely agitated, we can consider transfer to ICU for Precedex  I am not concerned about pulmonary embolism here and would suggest deferring CT angiogram chest until his agitated delirium is better controlled   Kara Mead MD. FCCP. [facility] Pager : [phone]  If no response to pager , please call [phone] until 7 pm After 7:00 pm call Elink  [phone]     11/11/2020, 11:39 AM

## 2020-11-11 NOTE — Progress Notes (Signed)
Pt is increasingly agitated , trying to get out of bed multiple times ,  pts CIWA score is increasing despite ativan given.  MD notified  Sitter and soft restraints ordered

## 2020-11-11 NOTE — Consult Note (Signed)
Patient unable to participate in psychiatric assessment due to being lethargic, combative and restless. He is unable to maintain eye contact nor answer any of my questions. Patient seems to be withdrawing from alcohol but unable to determine if he is hallucinating or suicidal due to his inability to answer questions.  Recommendations: -Continue 1:1 sitter for safety -Continue alcohol withdrawal protocol and consider higher level of care such as ICU if patient continues to deteriorate -Consider Haldol 2 mg IM(if pt unable to take PO) every q6 hrs as needed for agitation/psychosis. -Re-consult psychiatric service when patient is able to participate in assessment.  Corena Pilgrim, MD Attending psychiatrist.

## 2020-11-11 NOTE — Progress Notes (Signed)
  Echocardiogram 2D Echocardiogram has been attempted twice. Please see sonographer's note on report once completed by cardiologist. Could not complete exam due to patient being uncooperative.  Tyrone [name] 11/11/2020, 9:43 AM

## 2020-11-11 NOTE — Plan of Care (Signed)
Asked to see patient. Concerning decreased mentation. Patient has recently received 12mg  of ativan and transferred to ICU for precedex infusion for etoh withdrawal. Patient was extremely agitated when he first came to ICU. Shortly after became increasingly somnolent. On presentation patient resting comfortably. Will not arouse to voice. He will flutter eyes and mutter to sternal rub. He appears to be protecting airway. Do not thing patient requires intubation at this time but is at high risk for this. Agree with holding precedex and monitoring over night. Repeat ABG in 2-3 hours to ensure stable to improving CO2 retention. May need bipap vs intubation otherwise.   Newell Coral DO Internal Medicine/Pediatrics Pulmonary and Critical Care Fellow PGY-7

## 2020-11-11 NOTE — Progress Notes (Signed)
[facility] Progress Note Patient Name: Tyrone [name] DOB: 05/02/1946 MRN: [id number]   Date of Service  11/11/2020  HPI/Events of Note  Decreased LOC and wheezing. Being treated for ETOH withdrawal.Was on a Precedex IV infusion which is now off. Last dose of Ativan in early afternoon.   eICU Interventions  Plan: Portable CXR STAT. ABG STAT. Blood glucose STAT. Will request that PCCM ground team evaluate the patient at beside.      Intervention Category Major Interventions: Other:  Dquan, Cortopassi 11/11/2020, 10:32 PM

## 2020-11-11 NOTE — Progress Notes (Signed)
PROGRESS NOTE    Tyrone [name]  IWL:[id number] DOB: [birth year] DOA: 11/10/2020 PCP: Vivi Barrack, MD   Brief Narrative: 75 year old Caucasian male with a past medical history of non-insulin-dependent diabetes mellitus type 2, essential hypertension, gout, depression, alcohol dependence, GERD.  The patient was admitted for volume overload and suspected CHF.   Subjective: This morning at bedside the patient is very agitated and thrashing in the bed.  He is repeatedly attempting to get out of bed.  One-to-one sitter was initially started.  CIWA score remains elevated after multiple IV pushes of Ativan.  Daughter spoke with the nurse and stated that he probably drinks more than he told the admitting provider.  She also has concerns about suicidal ideation.  Upon my evaluation he is hallucinating and can only tell me his name.  He cannot tell me where he is.  Cannot tell me the date.  Still trying to get out of bed upon my examination.  We will start soft point restraints.  We will discuss with the ICU crit care team/PCCM.   Assessment & Plan:   Principal Problem:   Volume overload Active Problems:   Gout   GERD (gastroesophageal reflux disease)   HTN (hypertension)   Chest pain   Acute alcohol intoxication with now acute alcohol withdrawal with delirium -The patient remains agitated and hallucinating at bedside.  Still requiring multiple pushes of IV Ativan.  Remains on CIWA protocol.  CIWA score remains elevated even after IV Ativan on recheck.  We will start soft point restraints.  Discuss with PCCM.  May need Precedex drip.  Dyspnea on exertion/orthopnea and bilateral extremity edema with concern for suspected CHF -Patient presented with 1 month history of dyspnea on exertion, orthopnea and bilateral lower extremity edema.  BNP was 167.  Chest x-ray was clear.  No documented history of CHF or echo in the chart.  D-dimer was elevated but low suspicion for well canceled CT PE study  as he is still agitated.  Continue cardiac monitoring.  Continue IV Lasix 20 mg twice daily.  Echocardiogram ordered.  Continue monitoring I's and O's Daily weights and low-sodium and fluid restricted diet.  Elevated D-dimer -Low suspicion for pulmonary embolism therefore we will hold off on CT PE study as he is acutely agitated  Chest pain -High sensitive troponins borderline elevated 17>19>18.  EKG without acute ischemic changes.  Echocardiogram has been ordered.  Hypertension -Continue home amlodipine  Non-insulin-dependent diabetes mellitus type 2 -Continue sliding scale insulin sensitive ACHS  Gout -Continue home allopurinol  Depression -Celexa  GERD -PPI  DVT prophylaxis: Lovenox Code Status: Full Family Communication: No family present at bedside upon my evaluation Disposition:   Status is: Inpatient  Remains inpatient appropriate because:Altered mental status  Dispo: The patient is from: Home              Anticipated d/c is to: Home              Patient currently is not medically stable to d/c.   Difficult to place patient Yes        Consultants:  PCCM     Objective: Vitals:   11/11/20 0846 11/11/20 1042 11/11/20 1211 11/11/20 1300  BP: 138/65  136/75 117/66  Pulse: (!) 103 (!) 101 (!) 102 (!) 103  Resp:      Temp:      TempSrc:      SpO2:      Weight:      Height:  Intake/Output Summary (Last 24 hours) at 11/11/2020 1347 Last data filed at 11/11/2020 0515 Gross per 24 hour  Intake 240 ml  Output 450 ml  Net -210 ml   Filed Weights   11/11/20 0345  Weight: 89.9 kg    Examination:  General exam: Acutely agitated and thrashing in the bed attempting to repeatedly get out of bed Respiratory system: Clear to auscultation. Respiratory effort normal. Cardiovascular system: RRR Gastrointestinal system: Abdomen is nondistended, soft and nontender. No organomegaly or masses felt. Normal bowel sounds heard. Central nervous system: Alert  and oriented to name only. No focal neurological deficits. Extremities: Symmetric 5 x 5 power. Skin: No rashes, lesions or ulcers Psychiatry: Agitated    Data Reviewed: I have personally reviewed following labs and imaging studies  CBC: Recent Labs  Lab 11/10/20 1801  WBC 6.5  HGB 10.4*  HCT 32.9*  MCV 89.4  PLT 703    Basic Metabolic Panel: Recent Labs  Lab 11/10/20 1801 11/10/20 2350  NA 134*  --   K 3.7  --   CL 96*  --   CO2 23  --   GLUCOSE 131*  --   BUN 8  --   CREATININE 1.46*  --   CALCIUM 9.0  --   MG  --  2.0  PHOS  --  3.7    GFR: Estimated Creatinine Clearance: 48.5 mL/min (A) (by C-G formula based on SCr of 1.46 mg/dL (H)).  Liver Function Tests: Recent Labs  Lab 11/10/20 1801  AST 27  ALT 24  ALKPHOS 88  BILITOT 1.1  PROT 7.4  ALBUMIN 4.1    CBG: Recent Labs  Lab 11/11/20 0044 11/11/20 0606 11/11/20 1211  GLUCAP 146* 135* 125*     Recent Results (from the past 240 hour(s))  SARS CORONAVIRUS 2 (TAT 6-24 HRS) Nasopharyngeal Nasopharyngeal Swab     Status: None   Collection Time: 11/10/20 10:56 PM   Specimen: Nasopharyngeal Swab  Result Value Ref Range Status   SARS Coronavirus 2 NEGATIVE NEGATIVE Final    Comment: (NOTE) SARS-CoV-2 target nucleic acids are NOT DETECTED.  The SARS-CoV-2 RNA is generally detectable in upper and lower respiratory specimens during the acute phase of infection. Negative results do not preclude SARS-CoV-2 infection, do not rule out co-infections with other pathogens, and should not be used as the sole basis for treatment or other patient management decisions. Negative results must be combined with clinical observations, patient history, and epidemiological information. The expected result is Negative.  Fact Sheet for Patients: SugarRoll.be  Fact Sheet for Healthcare Providers: https://www.woods-mathews.com/  This test is not yet approved or cleared by  the [place] FDA and  has been authorized for detection and/or diagnosis of SARS-CoV-2 by FDA under an Emergency Use Authorization (EUA). This EUA will remain  in effect (meaning this test can be used) for the duration of the COVID-19 declaration under Se ction 564(b)(1) of the Act, 21 U.S.C. section 360bbb-3(b)(1), unless the authorization is terminated or revoked sooner.  Performed at [facility], [place] [address], [place], [place] [address]          Radiology Studies: DG Chest 2 View  Result Date: 11/10/2020 CLINICAL DATA:  Chest pain EXAM: CHEST - 2 VIEW COMPARISON:  10/11/2017 FINDINGS: The heart size and mediastinal contours are within normal limits. Both lungs are clear. Disc degenerative disease of the thoracic spine. IMPRESSION: No acute abnormality of the lungs. Electronically Signed   By: Dorna Bloom.D.  On: 11/10/2020 19:05   ECHOCARDIOGRAM COMPLETE  Result Date: 11/11/2020    ECHOCARDIOGRAM REPORT   Patient Name:   Tyrone [name] Date of Exam: 11/11/2020 Medical Rec #:  [id number]         Height:       69.0 in Accession #:    [id number]        Weight:       198.2 lb Date of Birth:  [birth year]         BSA:          2.058 m Patient Age:    69 years          BP:           116/71 mmHg Patient Gender: M                 HR:           99 bpm. Exam Location:  Inpatient Procedure: 2D Echo, Cardiac Doppler and Color Doppler Indications:    CHF - Acute systolic  History:        Patient has no prior history of Echocardiogram examinations.                 Signs/Symptoms:Chest Pain and Dyspnea; Risk Factors:Hypertension                 and Former Smoker. GERD. ETOH abuse.  Sonographer:    Clayton Lefort RDCS (AE) Referring Phys: [id number] [facility]  Sonographer Comments: Image acquisition challenging due to uncooperative patient. Unable to complete full echocardiogram due to patient being uncooperative, repeatedly trying to get out of bed during exam. After first attempt nurse  notified. Nurse gave medication, and I completed another echocardiogram. Upon returning to patient room to attempt to continue exam, patient with sitter still trying to get out of bed. IMPRESSIONS  1. The patient was not cooperative during the study and the study was unable to be completed. Grossly there is normal RV/LV function, but further assessment was not performed due to the patient's inability to comply with the examination.  2. Left ventricular ejection fraction, by estimation, is 60 to 65%. The left ventricle has normal function.  3. Right ventricular systolic function is normal. The right ventricular size is normal.  4. The aortic valve is tricuspid. Aortic valve regurgitation is not visualized. No aortic stenosis is present. FINDINGS  Left Ventricle: Left ventricular ejection fraction, by estimation, is 60 to 65%. The left ventricle has normal function. The left ventricular internal cavity size was normal in size. There is no left ventricular hypertrophy. Right Ventricle: The right ventricular size is normal. No increase in right ventricular wall thickness. Right ventricular systolic function is normal. Pericardium: There is no evidence of pericardial effusion. Presence of pericardial fat pad. Tricuspid Valve: The tricuspid valve is grossly normal. Tricuspid valve regurgitation is trivial. No evidence of tricuspid stenosis. Aortic Valve: The aortic valve is tricuspid. Aortic valve regurgitation is not visualized. No aortic stenosis is present. Pulmonic Valve: The pulmonic valve was grossly normal. Pulmonic valve regurgitation is trivial. No evidence of pulmonic stenosis. Aorta: The aortic root and ascending aorta are structurally normal, with no evidence of dilitation.  LEFT VENTRICLE PLAX 2D LVIDd:         4.30 cm LVIDs:         3.00 cm LV PW:         0.92 cm LV IVS:        1.11 cm LVOT diam:  2.20 cm LVOT Area:     3.80 cm  LEFT ATRIUM         Index LA diam:    3.70 cm 1.80 cm/m   AORTA Ao Root  diam: 3.80 cm Ao Asc diam:  3.70 cm  SHUNTS Systemic Diam: 2.20 cm Eleonore Chiquito MD Electronically signed by Eleonore Chiquito MD Signature Date/Time: 11/11/2020/10:00:23 AM    Final         Scheduled Meds:  allopurinol  300 mg Oral Daily   amLODipine  10 mg Oral Daily   citalopram  40 mg Oral Daily   enoxaparin (LOVENOX) injection  40 mg Subcutaneous K38V   folic acid  1 mg Oral Daily   furosemide  20 mg Intravenous BID   insulin aspart  0-5 Units Subcutaneous QHS   insulin aspart  0-9 Units Subcutaneous TID WC   multivitamin with minerals  1 tablet Oral Daily   pantoprazole  40 mg Oral BID   thiamine  100 mg Oral Daily   Or   thiamine  100 mg Intravenous Daily   Continuous Infusions:  dexmedetomidine (PRECEDEX) IV infusion       LOS: 0 days    Time spent: 35-minutes    Leslee Home, MD [facility]   To contact the attending provider between 7A-7P or the covering provider during after hours 7P-7A, please log into the web site [email or web address] and access using universal [place] password for that web site. If you do not have the password, please call the hospital operator.  11/11/2020, 1:47 PM

## 2020-11-11 NOTE — Progress Notes (Signed)
Pt arrived to unit, pt in bil wrist restraints. Pt cooperative on arrival, CHG bath complete and assessment. Pt began to get very agitated escalating quickly. Pt cursing at nurses. Attempted to get out of bed, hitting and screaming, trying to pull monitor off against restraints. Pt would fall asleep on and off for a few seconds then wake back up with same behavior. Precedex is started at 0.4. Pt has redness and bruising at wrist from pulling so hard at restraints. MD is aware.. Will continue to monitor. VSS.

## 2020-11-11 NOTE — ED Notes (Signed)
Attempted to call report to floor.Per Network engineer, nurse is unavailable at this time. Name and callback number for report left with secretary.

## 2020-11-11 NOTE — Progress Notes (Signed)
Pt is now having auditory hallucinations as well as visual hallucinations, pt is becoming verbally aggressive yelling profanity at staff. Pt is pulling hard on wrist restraints and has redness on wrists despite efforts to calm pt by medication , staff , and family. MD notified of worsening condition

## 2020-11-12 ENCOUNTER — Inpatient Hospital Stay (HOSPITAL_COMMUNITY): Payer: Medicare Other

## 2020-11-12 DIAGNOSIS — E8770 Fluid overload, unspecified: Secondary | ICD-10-CM

## 2020-11-12 DIAGNOSIS — F1010 Alcohol abuse, uncomplicated: Secondary | ICD-10-CM

## 2020-11-12 LAB — POCT I-STAT 7, (LYTES, BLD GAS, ICA,H+H)
Acid-Base Excess: 6 mmol/L — ABNORMAL HIGH (ref 0.0–2.0)
Bicarbonate: 30.8 mmol/L — ABNORMAL HIGH (ref 20.0–28.0)
Calcium, Ion: 1.14 mmol/L — ABNORMAL LOW (ref 1.15–1.40)
HCT: 30 % — ABNORMAL LOW (ref 39.0–52.0)
Hemoglobin: 10.2 g/dL — ABNORMAL LOW (ref 13.0–17.0)
O2 Saturation: 95 %
Patient temperature: 97.8
Potassium: 3.7 mmol/L (ref 3.5–5.1)
Sodium: 136 mmol/L (ref 135–145)
TCO2: 32 mmol/L (ref 22–32)
pCO2 arterial: 44.5 mmHg (ref 32.0–48.0)
pH, Arterial: 7.446 (ref 7.350–7.450)
pO2, Arterial: 72 mmHg — ABNORMAL LOW (ref 83.0–108.0)

## 2020-11-12 LAB — GLUCOSE, CAPILLARY
Glucose-Capillary: 124 mg/dL — ABNORMAL HIGH (ref 70–99)
Glucose-Capillary: 117 mg/dL — ABNORMAL HIGH (ref 70–99)
Glucose-Capillary: 122 mg/dL — ABNORMAL HIGH (ref 70–99)
Glucose-Capillary: 131 mg/dL — ABNORMAL HIGH (ref 70–99)
Glucose-Capillary: 104 mg/dL — ABNORMAL HIGH (ref 70–99)

## 2020-11-12 LAB — BASIC METABOLIC PANEL
Anion gap: 8 (ref 5–15)
BUN: 18 mg/dL (ref 8–23)
CO2: 28 mmol/L (ref 22–32)
Calcium: 8.1 mg/dL — ABNORMAL LOW (ref 8.9–10.3)
Chloride: 100 mmol/L (ref 98–111)
Creatinine, Ser: 2.05 mg/dL — ABNORMAL HIGH (ref 0.61–1.24)
GFR, Estimated: 33 mL/min — ABNORMAL LOW (ref >60)
Glucose, Bld: 121 mg/dL — ABNORMAL HIGH (ref 70–99)
Potassium: 3.9 mmol/L (ref 3.5–5.1)
Sodium: 136 mmol/L (ref 135–145)

## 2020-11-12 LAB — HEMOGLOBIN A1C
Hgb A1c MFr Bld: 6.1 % — ABNORMAL HIGH (ref 4.8–5.6)
Mean Plasma Glucose: 128 mg/dL

## 2020-11-12 IMAGING — CT CT HEAD W/O CM
4 series · 17 of 47 positions shown, 19 images · non-contrast
Comparison: None.

CLINICAL DATA: Head trauma.  Fall.

EXAM:
CT HEAD WITHOUT CONTRAST
TECHNIQUE: Contiguous axial images were obtained from the base of the skull
through the vertex without intravenous contrast.

[Series 3: head wo · axial · 0.49mm/px · z∈[+1032,+1152]mm · 7 of 34 slices shown, 9 images]
[im 5/34  brain]
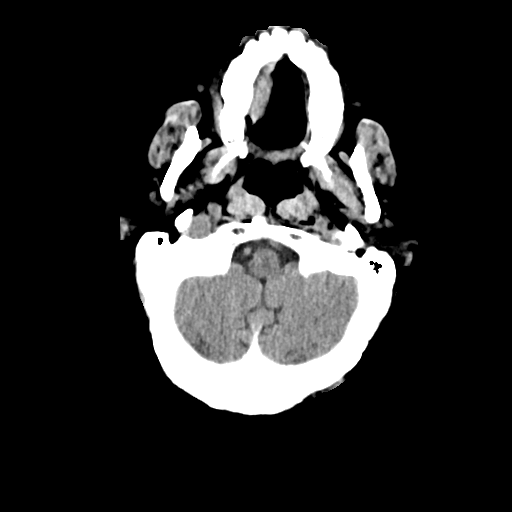
[im 5/34  bone]
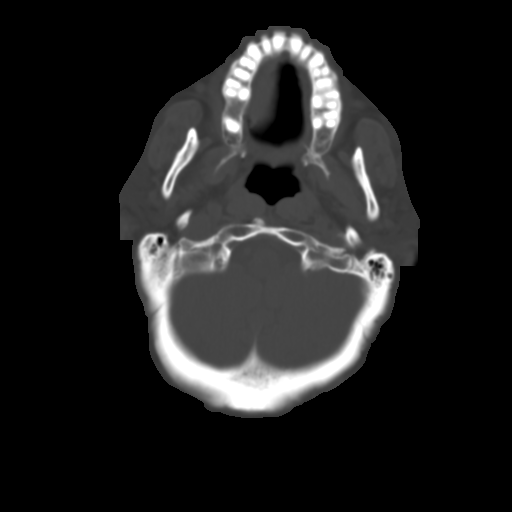
[im 9/34  brain]
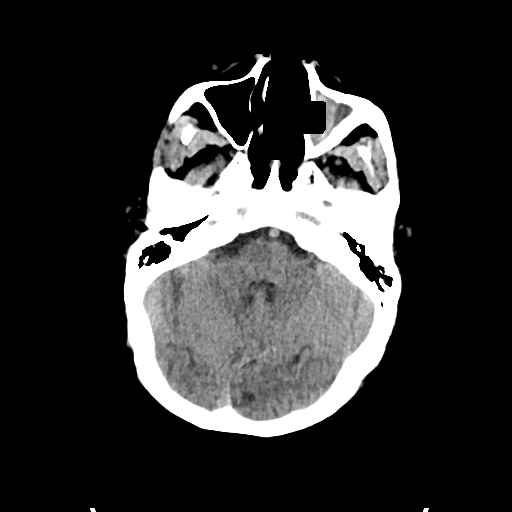
[im 13/34  brain]
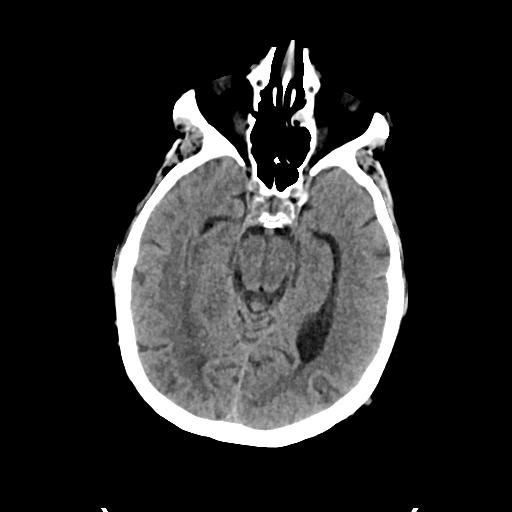
[im 17/34  brain]
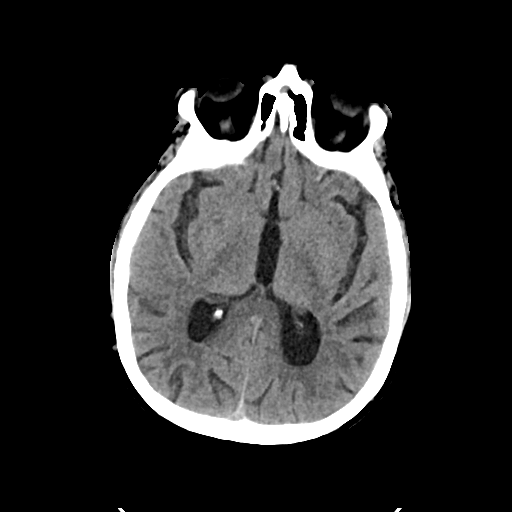
[im 21/34  brain]
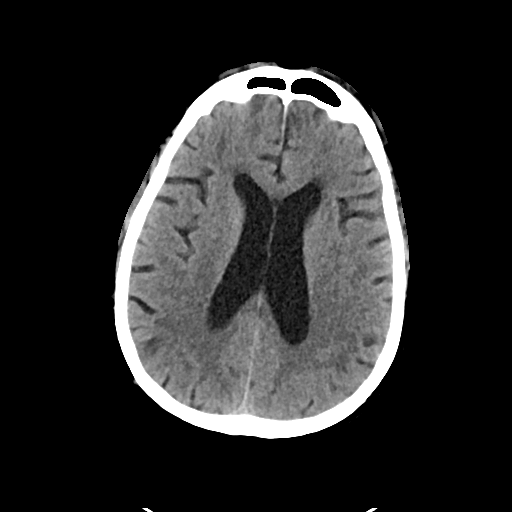
[im 21/34  bone]
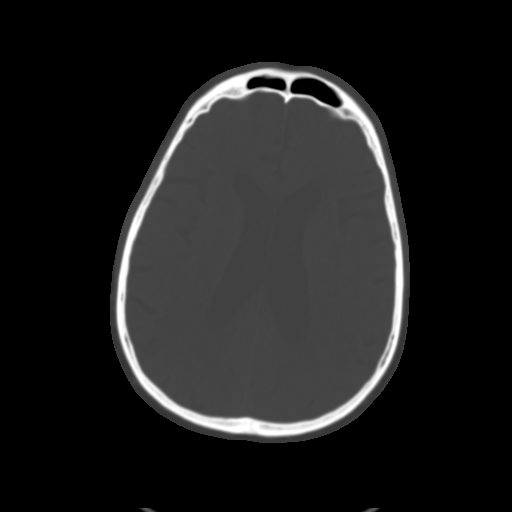
[im 25/34  brain]
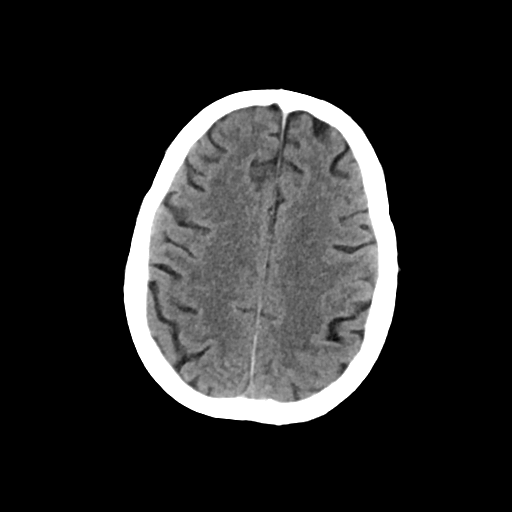
[im 29/34  brain]
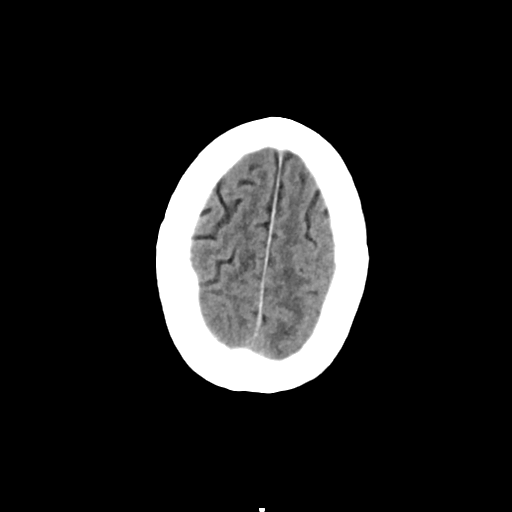

[Series 4: head bone · axial · 0.49mm/px · z∈[+1028,+1086]mm · 4 of 84 slices shown]
[im 9/84  bone]
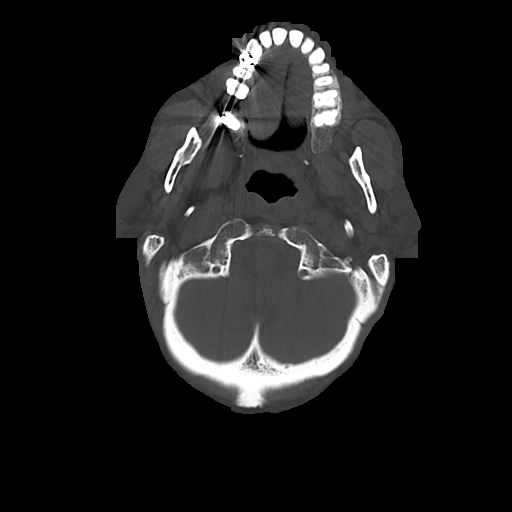
[im 17/84  bone]
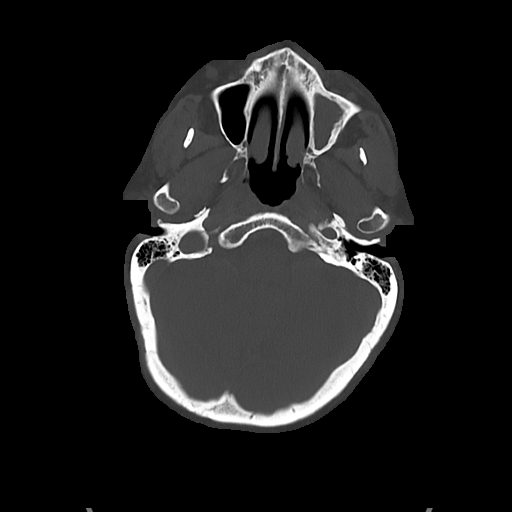
[im 25/84  bone]
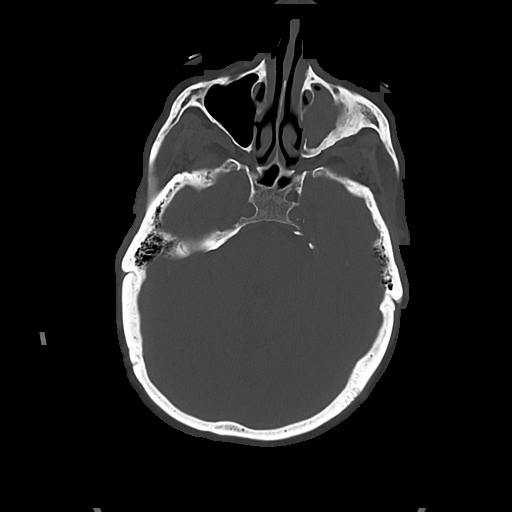
[im 38/84  bone]
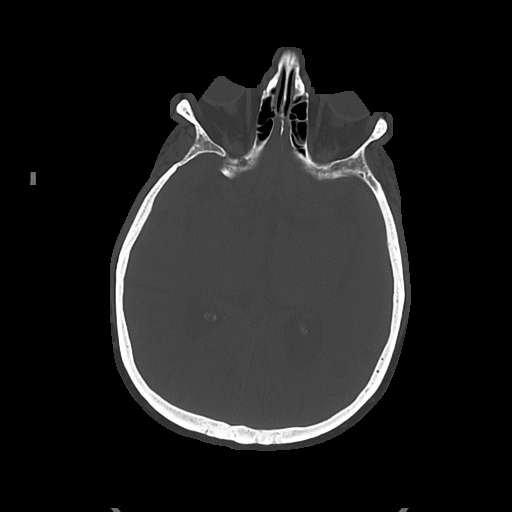

[Series 5: cor soft · coronal · 0.37mm/px · 3 of 75 slices shown]
[im 25/75  brain]
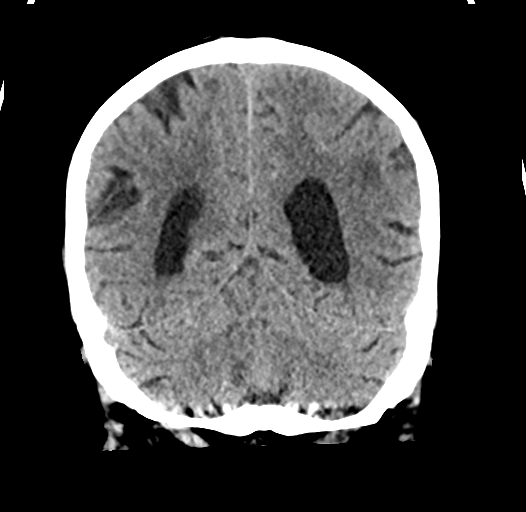
[im 33/75  brain]
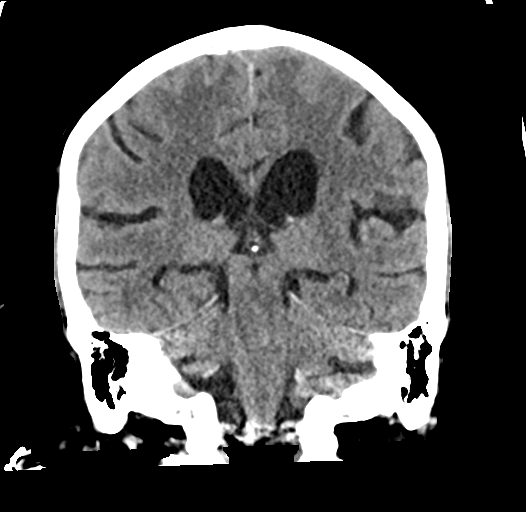
[im 42/75  brain]
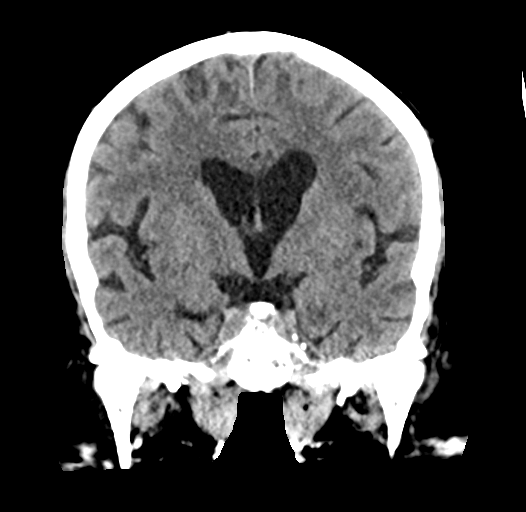

[Series 6: sag soft · sagittal · 0.37mm/px · 3 of 63 slices shown]
[im 21/63  brain]
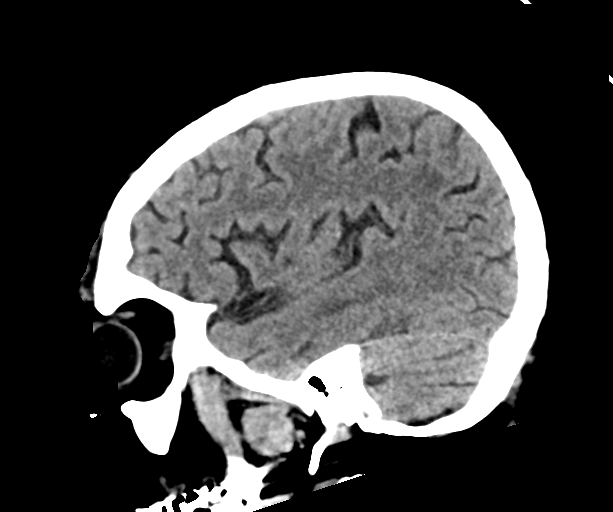
[im 32/63  brain]
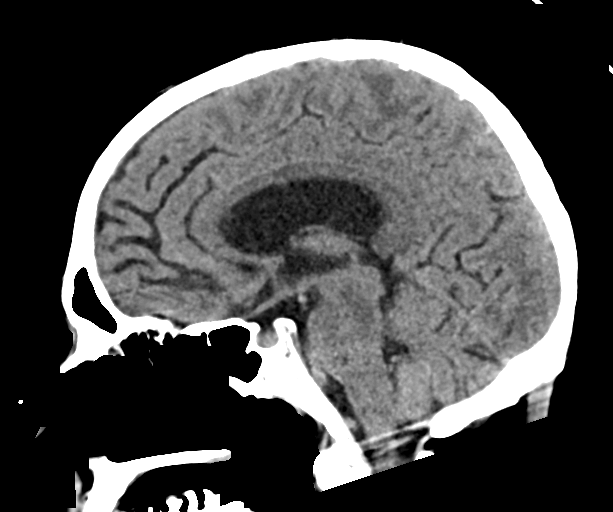
[im 42/63  brain]
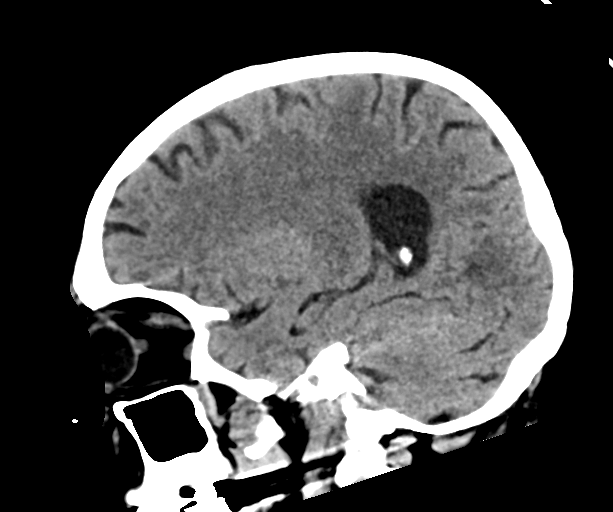

[17 of 47 positions shown; findings below may reference images not displayed]

FINDINGS: Brain: Mild age related volume loss. No evidence of old or recent
focal infarction, mass lesion, hemorrhage, hydrocephalus or
extra-axial collection.

Vascular: There is atherosclerotic calcification of the major
vessels at the base of the brain.

Skull: No skull fracture.

Sinuses/Orbits: Chronic sinusitis of the left maxillary sinus. Few
opacified ethmoid air cells on the left. Orbits are negative.

Other: Left forehead and frontal scalp injury.
IMPRESSION: Left forehead and frontal scalp injury. No underlying skull
fracture. No traumatic intracranial finding.

Chronic left maxillary sinusitis.

## 2020-11-12 MED ORDER — CITALOPRAM HYDROBROMIDE 20 MG PO TABS
20.0000 mg | ORAL_TABLET | Freq: Every day | ORAL | Status: DC
  Administered 2020-11-12 – 2020-11-20 (×8): 20 mg via ORAL
  Filled 2020-11-12 (×9): qty 1

## 2020-11-12 MED ORDER — LIDOCAINE HCL (PF) 1 % IJ SOLN
INTRAMUSCULAR | Status: AC
  Administered 2020-11-12: 5 mL
  Filled 2020-11-12: qty 5

## 2020-11-12 MED ORDER — LIDOCAINE HCL (CARDIAC) PF 100 MG/5ML IV SOSY
PREFILLED_SYRINGE | INTRAVENOUS | Status: AC
  Filled 2020-11-12: qty 5

## 2020-11-12 MED ORDER — LACTATED RINGERS IV SOLN: INTRAVENOUS | Status: DC

## 2020-11-12 MED ORDER — SODIUM CHLORIDE 0.9 % IV BOLUS
1000.0000 mL | Freq: Once | INTRAVENOUS | Status: AC
  Administered 2020-11-12: 1000 mL via INTRAVENOUS

## 2020-11-12 MED ORDER — LIDOCAINE HCL 1 % IJ SOLN
10.0000 mL | Freq: Once | INTRAMUSCULAR | Status: AC
  Filled 2020-11-12: qty 10

## 2020-11-12 NOTE — Progress Notes (Signed)
On admission patient alert and oriented to person, place, situation.  Patient unable to state the month or year but knows the president. Within an hour, patient begins trying to get up and states he is feeling anxious and wants to "go back to his room". Attempts at reorienting patient unsuccessful, and 1mg  IV ativan given.    Patient continues to attempt getting out of bed, needing to leave, no longer knows where he is, trying to leave and increasingly agitated. Patient wife able to calm patient some. RR RN notified and came to bedside. Repeat CIWA score of 12 and 2mg  IV ativan given. Elink notified and suggested to continue CIWA protocol.

## 2020-11-12 NOTE — Consult Note (Signed)
Name: Tyrone [name] MRN: [id number] DOB: 1945-09-12    ADMISSION DATE:  11/10/2020 CONSULTATION DATE:  11/12/2020 REFERRING MD :  Welford Roche  CHIEF COMPLAINT: Alcohol withdrawal, agitation  BRIEF PATIENT DESCRIPTION: 75 year old EtOH user admitted for dyspnea with bilateral lower extremity edema, PCCM consulted for EtOH withdrawal, received 6 mg of Ativan since 4 AM  SIGNIFICANT EVENTS  6/25 admission for dyspnea, suspected CHF 6/26 transferred to ICU for encephalopathy, felt to be EtOH withdrawal  STUDIES:  Echo 6/26 normal LV function   HISTORY OF PRESENT ILLNESS: 75 year old heavy EtOH user, diabetic, hypertensive admitted overnight with 1 month history of dyspnea on exertion, bilateral lower extremity edema.  History is obtained from chart review since patient is unable to provide.  He is only oriented to name currently.  His EtOH level was 39 on admission, BNP was 167 chest x-ray was clear, no hypoxia .  He was given 1 mg of Ativan in the ED and admitted with a presumptive diagnosis of CHF. He required more doses of Ativan since 4 AM and has required 6 mg of Ativan in the last 6 hours.  PCCM consulted for increased agitation   PAST MEDICAL HISTORY :   has a past medical history of Anemia, Arthritis, Blood transfusion without reported diagnosis, Depression, Hypertension, Kidney stone, and Multiple duodenal ulcers.  has a past surgical history that includes Lithotripsy; Abdominal surgery; and Colonoscopy. Prior to Admission medications   Medication Sig Start Date End Date Taking? Authorizing Provider  allopurinol (ZYLOPRIM) 300 MG tablet TAKE 1 TABLET BY MOUTH DAILY Patient taking differently: Take 300 mg by mouth daily. 03/01/20  Yes Vivi Barrack, MD  amLODipine (NORVASC) 10 MG tablet TAKE 1 TABLET BY MOUTH DAILY Patient taking differently: Take 10 mg by mouth daily. 07/31/20  Yes Vivi Barrack, MD  citalopram (CELEXA) 40 MG tablet Take 40 mg by mouth daily. 11/10/20  Yes  [provider]  Cyanocobalamin (B-12) 5000 MCG CAPS Take by mouth 2 (two) times daily.   Yes [provider]  cyclobenzaprine (FLEXERIL) 10 MG tablet Take 1 tablet (10 mg total) by mouth 3 (three) times daily as needed for muscle spasms. 10/04/19  Yes Vivi Barrack, MD  folic acid (FOLVITE) 1 MG tablet Take 1 mg by mouth daily.   Yes [provider]  furosemide (LASIX) 20 MG tablet Take 1 tablet (20 mg total) by mouth daily as needed for edema. 10/30/20  Yes Maximiano Coss, NP  losartan (COZAAR) 100 MG tablet Take 100 mg by mouth daily. 11/10/20  Yes [provider]  metFORMIN (GLUCOPHAGE) 500 MG tablet Take 500 mg by mouth daily. 11/10/20  Yes [provider]  naproxen sodium (ALEVE) 220 MG tablet Take 440 mg by mouth daily.   Yes [provider]  pantoprazole (PROTONIX) 40 MG tablet TAKE 1 TABLET BY MOUTH TWICE DAILY Patient taking differently: Take 40 mg by mouth 2 (two) times daily. 09/26/20  Yes Vivi Barrack, MD  triamcinolone 0.1%-Eucerin equivalent 1:1 cream mixture Apply 1 application topically 2 (two) times daily as needed for itching. 07/12/20  Yes [provider]  sulfamethoxazole-trimethoprim (BACTRIM DS) 800-160 MG tablet Take 1 tablet by mouth 2 (two) times daily. Patient not taking: No sig reported 10/30/20   Maximiano Coss, NP   No Known Allergies  FAMILY HISTORY:  family history is not on file. SOCIAL HISTORY:  reports that he has quit smoking. He quit smokeless tobacco use about 13 years ago.  His  smokeless tobacco use included chew. He reports previous alcohol use. He reports that he does not use drugs.  REVIEW OF SYSTEMS:    Unable to obtain due to altered mental status  SUBJECTIVE:   VITAL SIGNS: Temp:  [95 F (35 C)-98.9 F (37.2 C)] 97.3 F (36.3 C) (06/27 0700) Pulse Rate:  [48-104] 48 (06/27 1000) Resp:  [12-25] 15 (06/27 1000) BP: (86-157)/(55-96) 99/62 (06/27 1000) SpO2:  [88 %-99 %] 99 %  (06/27 1000) Weight:  [88.1 kg] 88.1 kg (06/27 0500)  PHYSICAL EXAMINATION: General: Middle-age man, in bilateral wrist restraints, moving around in bed Neuro: Unable to focus, oriented to name, not to place or time HEENT: No pallor, no icterus, no JVD Cardiovascular: S1-S2 mild tacky, regular Lungs: Clear breath sounds bilateral, no rhonchi Abdomen: Soft, nontender abdomen Musculoskeletal: No deformity Skin: No edema, no rash  Recent Labs  Lab 11/10/20 1801 11/11/20 2253 11/12/20 0257 11/12/20 0822  NA 134* 136 136 136  K 3.7 3.8 3.7 3.9  CL 96*  --   --  100  CO2 23  --   --  28  BUN 8  --   --  18  CREATININE 1.46*  --   --  2.05*  GLUCOSE 131*  --   --  121*    Recent Labs  Lab 11/10/20 1801 11/11/20 2253 11/12/20 0257  HGB 10.4* 9.9* 10.2*  HCT 32.9* 29.0* 30.0*  WBC 6.5  --   --   PLT 293  --   --     DG Chest 2 View  Result Date: 11/10/2020 CLINICAL DATA:  Chest pain EXAM: CHEST - 2 VIEW COMPARISON:  10/11/2017 FINDINGS: The heart size and mediastinal contours are within normal limits. Both lungs are clear. Disc degenerative disease of the thoracic spine. IMPRESSION: No acute abnormality of the lungs. Electronically Signed   By: Eddie Candle M.D.   On: 11/10/2020 19:05   DG CHEST PORT 1 VIEW  Result Date: 11/11/2020 CLINICAL DATA:  Wheezing EXAM: PORTABLE CHEST 1 VIEW COMPARISON:  11/10/2020 FINDINGS: Very low lung volumes. Bibasilar atelectasis. Heart is normal size. No effusions. No acute bony abnormality. IMPRESSION: Low lung volumes with bibasilar atelectasis. Electronically Signed   By: Rolm Baptise M.D.   On: 11/11/2020 23:00   ECHOCARDIOGRAM COMPLETE  Result Date: 11/11/2020    ECHOCARDIOGRAM REPORT   Patient Name:   Tyrone [name] Date of Exam: 11/11/2020 Medical Rec #:  [id number]         Height:       69.0 in Accession #:    [id number]        Weight:       198.2 lb Date of Birth:  [birth year]         BSA:          2.058 m Patient Age:    75 years           BP:           116/71 mmHg Patient Gender: M                 HR:           99 bpm. Exam Location:  Inpatient Procedure: 2D Echo, Cardiac Doppler and Color Doppler Indications:    CHF - Acute systolic  History:        Patient has no prior history of Echocardiogram examinations.  Signs/Symptoms:Chest Pain and Dyspnea; Risk Factors:Hypertension                 and Former Smoker. GERD. ETOH abuse.  Sonographer:    Clayton Lefort RDCS (AE) Referring Phys: [id number] [facility]  Sonographer Comments: Image acquisition challenging due to uncooperative patient. Unable to complete full echocardiogram due to patient being uncooperative, repeatedly trying to get out of bed during exam. After first attempt nurse notified. Nurse gave medication, and I completed another echocardiogram. Upon returning to patient room to attempt to continue exam, patient with sitter still trying to get out of bed. IMPRESSIONS  1. The patient was not cooperative during the study and the study was unable to be completed. Grossly there is normal RV/LV function, but further assessment was not performed due to the patient's inability to comply with the examination.  2. Left ventricular ejection fraction, by estimation, is 60 to 65%. The left ventricle has normal function.  3. Right ventricular systolic function is normal. The right ventricular size is normal.  4. The aortic valve is tricuspid. Aortic valve regurgitation is not visualized. No aortic stenosis is present. FINDINGS  Left Ventricle: Left ventricular ejection fraction, by estimation, is 60 to 65%. The left ventricle has normal function. The left ventricular internal cavity size was normal in size. There is no left ventricular hypertrophy. Right Ventricle: The right ventricular size is normal. No increase in right ventricular wall thickness. Right ventricular systolic function is normal. Pericardium: There is no evidence of pericardial effusion. Presence of pericardial fat  pad. Tricuspid Valve: The tricuspid valve is grossly normal. Tricuspid valve regurgitation is trivial. No evidence of tricuspid stenosis. Aortic Valve: The aortic valve is tricuspid. Aortic valve regurgitation is not visualized. No aortic stenosis is present. Pulmonic Valve: The pulmonic valve was grossly normal. Pulmonic valve regurgitation is trivial. No evidence of pulmonic stenosis. Aorta: The aortic root and ascending aorta are structurally normal, with no evidence of dilitation.  LEFT VENTRICLE PLAX 2D LVIDd:         4.30 cm LVIDs:         3.00 cm LV PW:         0.92 cm LV IVS:        1.11 cm LVOT diam:     2.20 cm LVOT Area:     3.80 cm  LEFT ATRIUM         Index LA diam:    3.70 cm 1.80 cm/m   AORTA Ao Root diam: 3.80 cm Ao Asc diam:  3.70 cm  SHUNTS Systemic Diam: 2.20 cm Eleonore Chiquito MD Electronically signed by Eleonore Chiquito MD Signature Date/Time: 11/11/2020/10:00:23 AM    Final     ASSESSMENT / PLAN:  Alcohol withdrawal : --CIWA with PRN ativan IV --Precedex gtt as supplementation as needed for agitation --folic acid, thaimine  HTN:  --amlodipine  Best Practice (right click and "Reselect all SmartList Selections" daily)   Diet/type: NPO w/ oral meds Pain/Anxiety/Delirium protocol Not indicated VAP protocol (if indicated): Not indicated DVT prophylaxis: LMWH GI prophylaxis: PPI Glucose control:  not indicated Lines: N/A Foley:  N/A Culture data pending:none  Antibiotics:not indicated.  Antibiotic de-escalation: stop date ordered.  Code Status:  full code Last date of multidisciplinary goals of care discussion [n/a] Disposition: remains critically ill, will stay in intensive care    CRITICAL CARE Performed by: Bonna Gains Kashis Penley   Total critical care time: 32 minutes  Critical care time was exclusive of separately billable procedures and treating  other patients.  Critical care was necessary to treat or prevent imminent or life-threatening deterioration.  Critical  care was time spent personally by me on the following activities: development of treatment plan with patient and/or surrogate as well as nursing, discussions with consultants, evaluation of patient's response to treatment, examination of patient, obtaining history from patient or surrogate, ordering and performing treatments and interventions, ordering and review of laboratory studies, ordering and review of radiographic studies, pulse oximetry and re-evaluation of patient's condition.      Lanier Clam, MD  If no response to pager , please call [phone] until 7 pm After 7:00 pm call Elink  [phone]     11/12/2020, 10:36 AM

## 2020-11-12 NOTE — Consult Note (Signed)
[facility] Face-to-Face Psychiatry Consult   Reason for Consult:  EtoH withdrawal, hallucination, SI? Referring Physician:  Imagene Sheller, MD Patient Identification: Tyrone [name] MRN:  [id number] Principal Diagnosis: Volume overload Diagnosis:  Principal Problem:   Volume overload Active Problems:   Gout   GERD (gastroesophageal reflux disease)   HTN (hypertension)   Chest pain   Total Time spent with patient: 20 minutes  Subjective:   Tyrone [name] is a 75 y.o. male patient admitted with dyspnea with bilateral lower extremity edema, PCCM consulted for EtOH withdrawal, received 6 mg of Ativan since 4 AM.  HPI:  On assessment this AM patient is able to participate a bit more in exam. Patient is able to grumble out some answers, but prefers to keep his eyes closed while talking. Patient denies SI, HI or VH. Patient reports that he has been having some AH during withdrawal. Patient does not recall ever having SI recently, but reports that he may have said that he wanted to kill himself along time ago, but he notes that even then he did not actually intend to and there was no meaning behind his words. 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 Patient reports that he has not been taking his Celexa or any medications. Patient reports he is not interested in rehab but is aware that he should stop drinking. Patient denies hx of trauma or PTSD symptoms.   Past Psychiatric History: Depression, EtOH use disorder  Risk to Self:  NO Risk to Others:  NO Prior Inpatient Therapy: None reported  Prior Outpatient Therapy:   None reported  Past Medical History:  Past Medical History:  Diagnosis Date   Anemia     Arthritis    hands   Blood transfusion without reported diagnosis    for stomach ulcers   Depression    Hypertension    Kidney stone    Multiple duodenal ulcers     Past Surgical History:  Procedure Laterality Date   ABDOMINAL SURGERY     ulcers   COLONOSCOPY     LITHOTRIPSY     Family History:  Family History  Problem Relation Age of Onset   Colon cancer Neg Hx    Esophageal cancer Neg Hx    Rectal cancer Neg Hx    Stomach cancer Neg Hx    Family Psychiatric  History: None reported Social History:  Social History   Substance and Sexual Activity  Alcohol Use Not Currently   Alcohol/week: 0.0 standard drinks   Comment: sober 2.5 months     Social History   Substance and Sexual Activity  Drug Use No    Social History   Socioeconomic History   Marital status: Married    Spouse name: Not on file   Number of children: Not on file   Years of education: Not on file   Highest education level: Not on file  Occupational History   Not on file  Tobacco Use   Smoking status: Former    Pack years: 0.00   Smokeless tobacco: Former    Types: Chew    Quit date: 05/30/2007  Substance and Sexual Activity   Alcohol use: Not  Currently    Alcohol/week: 0.0 standard drinks    Comment: sober 2.5 months   Drug use: No   Sexual activity: Not on file  Other Topics Concern   Not on file  Social History Narrative   Not on file   Social Determinants of Health   Financial Resource Strain: Not on file  Food Insecurity: Not on file  Transportation Needs: Not on file  Physical Activity: Not on file  Stress: Not on file  Social Connections: Not on file   Additional Social History:    Allergies:  No Known Allergies  Labs:  Results for orders placed or performed during the hospital encounter of 11/10/20 (from the past 48 hour(s))  Basic metabolic panel     Status: Abnormal   Collection Time: 11/10/20  6:01 PM  Result Value Ref Range   Sodium 134 (L) 135 - 145 mmol/L    Potassium 3.7 3.5 - 5.1 mmol/L   Chloride 96 (L) 98 - 111 mmol/L   CO2 23 22 - 32 mmol/L   Glucose, Bld 131 (H) 70 - 99 mg/dL    Comment: Glucose reference range applies only to samples taken after fasting for at least 8 hours.   BUN 8 8 - 23 mg/dL   Creatinine, Ser 1.46 (H) 0.61 - 1.24 mg/dL   Calcium 9.0 8.9 - 10.3 mg/dL   GFR, Estimated 50 (L) >60 mL/min    Comment: (NOTE) Calculated using the CKD-EPI Creatinine Equation (2021)    Anion gap 15 5 - 15    Comment: Performed at [facility] [address], [place], [place] [address]  CBC     Status: Abnormal   Collection Time: 11/10/20  6:01 PM  Result Value Ref Range   WBC 6.5 4.0 - 10.5 K/uL   RBC 3.68 (L) 4.22 - 5.81 MIL/uL   Hemoglobin 10.4 (L) 13.0 - 17.0 g/dL   HCT 32.9 (L) 39.0 - 52.0 %   MCV 89.4 80.0 - 100.0 fL   MCH 28.3 26.0 - 34.0 pg   MCHC 31.6 30.0 - 36.0 g/dL   RDW 15.7 (H) 11.5 - 15.5 %   Platelets 293 150 - 400 K/uL   nRBC 0.0 0.0 - 0.2 %    Comment: Performed at [facility] [address] [place], [place] [address]  Troponin I (High Sensitivity)     Status: None   Collection Time: 11/10/20  6:01 PM  Result Value Ref Range   Troponin I (High Sensitivity) 17 <18 ng/L    Comment: (NOTE) Elevated high sensitivity troponin I (hsTnI) values and significant  changes across serial measurements may suggest ACS but many other  chronic and acute conditions are known to elevate hsTnI results.  Refer to the "Links" section for chest pain algorithms and additional  guidance. Performed at [facility], [place] [address] [place], [place] [address]   Hepatic function panel     Status: None   Collection Time: 11/10/20  6:01 PM  Result Value Ref Range   Total Protein 7.4 6.5 - 8.1 g/dL   Albumin 4.1 3.5 - 5.0 g/dL   AST 27 15 - 41 U/L   ALT 24 0 - 44 U/L   Alkaline Phosphatase 88 38 - 126 U/L   Total Bilirubin 1.1 0.3 - 1.2 mg/dL   Bilirubin, Direct 0.2 0.0 - 0.2 mg/dL   Indirect Bilirubin 0.9  0.3 - 0.9 mg/dL    Comment: Performed at [facility], [place] [address], [place], [place] [address]  Brain natriuretic peptide     Status: Abnormal   Collection Time: 11/10/20  6:07 PM  Result Value Ref Range   B Natriuretic Peptide 167.2 (H) 0.0 - 100.0 pg/mL    Comment: Performed at [facility] [address] [place], [place] [address]  Troponin I (High Sensitivity)     Status: Abnormal   Collection Time: 11/10/20  8:11 PM  Result Value Ref Range   Troponin I (High Sensitivity) 19 (H) <18 ng/L    Comment: (NOTE) Elevated high sensitivity troponin I (hsTnI) values and significant  changes across serial measurements may suggest ACS but many other  chronic and acute conditions are known to elevate hsTnI results.  Refer to the "Links" section for chest pain algorithms and additional  guidance. Performed at [facility], [place] [address], [place], [place] [address]   Ethanol     Status: Abnormal   Collection Time: 11/10/20 10:45 PM  Result Value Ref Range   Alcohol, Ethyl (B) 39 (H) <10 mg/dL    Comment: (NOTE) Lowest detectable limit for serum alcohol is 10 mg/dL.  For medical purposes only. Performed at [facility], [place] [address], [place], [place] [address]   Troponin I (High Sensitivity)     Status: Abnormal   Collection Time: 11/10/20 10:45 PM  Result Value Ref Range   Troponin I (High Sensitivity) 18 (H) <18 ng/L    Comment: (NOTE) Elevated high sensitivity troponin I (hsTnI) values and significant  changes across serial measurements may suggest ACS but many other  chronic and acute conditions are known to elevate hsTnI results.  Refer to the "Links" section for chest pain algorithms and additional  guidance. Performed at [facility], [place] [address] [place], [place] [address]   SARS CORONAVIRUS 2 (TAT 6-24 HRS) Nasopharyngeal Nasopharyngeal Swab     Status: None   Collection Time: 11/10/20 10:56 PM   Specimen: Nasopharyngeal Swab   Result Value Ref Range   SARS Coronavirus 2 NEGATIVE NEGATIVE    Comment: (NOTE) SARS-CoV-2 target nucleic acids are NOT DETECTED.  The SARS-CoV-2 RNA is generally detectable in upper and lower respiratory specimens during the acute phase of infection. Negative results do not preclude SARS-CoV-2 infection, do not rule out co-infections with other pathogens, and should not be used as the sole basis for treatment or other patient management decisions. Negative results must be combined with clinical observations, patient history, and epidemiological information. The expected result is Negative.  Fact Sheet for Patients: SugarRoll.be  Fact Sheet for Healthcare Providers: https://www.woods-mathews.com/  This test is not yet approved or cleared by the [place] FDA and  has been authorized for detection and/or diagnosis of SARS-CoV-2 by FDA under an Emergency Use Authorization (EUA). This EUA will remain  in effect (meaning this test can be used) for the duration of the COVID-19 declaration under Se ction 564(b)(1) of the Act, 21 U.S.C. section 360bbb-3(b)(1), unless the authorization is terminated or revoked sooner.  Performed at [facility], [place] [address], [place], [place] [address]   Magnesium     Status: None   Collection Time: 11/10/20 11:50 PM  Result Value Ref Range   Magnesium 2.0 1.7 - 2.4 mg/dL    Comment: Performed at [facility], [place] [address], [place], [place] [address]  Phosphorus     Status: None   Collection Time: 11/10/20 11:50 PM  Result  Value Ref Range   Phosphorus 3.7 2.5 - 4.6 mg/dL    Comment: Performed at [facility] [address] [place], [place] [address]  Hemoglobin A1c     Status: Abnormal   Collection Time: 11/10/20 11:50 PM  Result Value Ref Range   Hgb A1c MFr Bld 6.1 (H) 4.8 - 5.6 %    Comment: (NOTE)         Prediabetes: 5.7 - 6.4         Diabetes: >6.4         Glycemic  control for adults with diabetes: <7.0    Mean Plasma Glucose 128 mg/dL    Comment: (NOTE) Performed At: [facility] [place], [place] [id number] Rush Farmer MD FO:[id number]   D-dimer, quantitative     Status: Abnormal   Collection Time: 11/10/20 11:50 PM  Result Value Ref Range   D-Dimer, Quant 1.02 (H) 0.00 - 0.50 ug/mL-FEU    Comment: (NOTE) At the manufacturer cut-off value of 0.5 g/mL FEU, this assay has a negative predictive value of 95-100%.This assay is intended for use in conjunction with a clinical pretest probability (PTP) assessment model to exclude pulmonary embolism (PE) and deep venous thrombosis (DVT) in outpatients suspected of PE or DVT. Results should be correlated with clinical presentation. Performed at [facility], [place] [address], [place], [place] [address]   CBG monitoring, ED     Status: Abnormal   Collection Time: 11/11/20 12:44 AM  Result Value Ref Range   Glucose-Capillary 146 (H) 70 - 99 mg/dL    Comment: Glucose reference range applies only to samples taken after fasting for at least 8 hours.  Glucose, capillary     Status: Abnormal   Collection Time: 11/11/20  6:06 AM  Result Value Ref Range   Glucose-Capillary 135 (H) 70 - 99 mg/dL    Comment: Glucose reference range applies only to samples taken after fasting for at least 8 hours.  Glucose, capillary     Status: Abnormal   Collection Time: 11/11/20 12:11 PM  Result Value Ref Range   Glucose-Capillary 125 (H) 70 - 99 mg/dL    Comment: Glucose reference range applies only to samples taken after fasting for at least 8 hours.   Comment 1 Notify RN   Glucose, capillary     Status: Abnormal   Collection Time: 11/11/20  3:00 PM  Result Value Ref Range   Glucose-Capillary 120 (H) 70 - 99 mg/dL    Comment: Glucose reference range applies only to samples taken after fasting for at least 8 hours.  MRSA Next Gen by PCR, Nasal     Status: None   Collection Time:  11/11/20  3:20 PM   Specimen: Nasal Mucosa; Nasal Swab  Result Value Ref Range   MRSA by PCR Next Gen NOT DETECTED NOT DETECTED    Comment: (NOTE) The GeneXpert MRSA Assay (FDA approved for NASAL specimens only), is one component of a comprehensive MRSA colonization surveillance program. It is not intended to diagnose MRSA infection nor to guide or monitor treatment for MRSA infections. Test performance is not FDA approved in patients less than 42 years old. Performed at [facility], [place] [address], [place], [place] [address]   Glucose, capillary     Status: Abnormal   Collection Time: 11/11/20  7:10 PM  Result Value Ref Range   Glucose-Capillary 125 (H) 70 - 99 mg/dL    Comment: Glucose reference range applies only  to samples taken after fasting for at least 8 hours.  I-STAT 7, (LYTES, BLD GAS, ICA, H+H)     Status: Abnormal   Collection Time: 11/11/20 10:53 PM  Result Value Ref Range   pH, Arterial 7.314 (L) 7.350 - 7.450   pCO2 arterial 66.0 (HH) 32.0 - 48.0 mmHg   pO2, Arterial 77 (L) 83.0 - 108.0 mmHg   Bicarbonate 33.8 (H) 20.0 - 28.0 mmol/L   TCO2 36 (H) 22 - 32 mmol/L   O2 Saturation 94.0 %   Acid-Base Excess 6.0 (H) 0.0 - 2.0 mmol/L   Sodium 136 135 - 145 mmol/L   Potassium 3.8 3.5 - 5.1 mmol/L   Calcium, Ion 1.19 1.15 - 1.40 mmol/L   HCT 29.0 (L) 39.0 - 52.0 %   Hemoglobin 9.9 (L) 13.0 - 17.0 g/dL   Patient temperature 97.7 F    Sample type ARTERIAL    Comment NOTIFIED PHYSICIAN   Glucose, capillary     Status: Abnormal   Collection Time: 11/11/20 10:59 PM  Result Value Ref Range   Glucose-Capillary 132 (H) 70 - 99 mg/dL    Comment: Glucose reference range applies only to samples taken after fasting for at least 8 hours.  I-STAT 7, (LYTES, BLD GAS, ICA, H+H)     Status: Abnormal   Collection Time: 11/12/20  2:57 AM  Result Value Ref Range   pH, Arterial 7.446 7.350 - 7.450   pCO2 arterial 44.5 32.0 - 48.0 mmHg   pO2, Arterial 72 (L) 83.0 - 108.0 mmHg    Bicarbonate 30.8 (H) 20.0 - 28.0 mmol/L   TCO2 32 22 - 32 mmol/L   O2 Saturation 95.0 %   Acid-Base Excess 6.0 (H) 0.0 - 2.0 mmol/L   Sodium 136 135 - 145 mmol/L   Potassium 3.7 3.5 - 5.1 mmol/L   Calcium, Ion 1.14 (L) 1.15 - 1.40 mmol/L   HCT 30.0 (L) 39.0 - 52.0 %   Hemoglobin 10.2 (L) 13.0 - 17.0 g/dL   Patient temperature 97.8 F    Sample type ARTERIAL   Glucose, capillary     Status: Abnormal   Collection Time: 11/12/20  4:57 AM  Result Value Ref Range   Glucose-Capillary 124 (H) 70 - 99 mg/dL    Comment: Glucose reference range applies only to samples taken after fasting for at least 8 hours.  Glucose, capillary     Status: Abnormal   Collection Time: 11/12/20  7:29 AM  Result Value Ref Range   Glucose-Capillary 117 (H) 70 - 99 mg/dL    Comment: Glucose reference range applies only to samples taken after fasting for at least 8 hours.  Basic metabolic panel     Status: Abnormal   Collection Time: 11/12/20  8:22 AM  Result Value Ref Range   Sodium 136 135 - 145 mmol/L   Potassium 3.9 3.5 - 5.1 mmol/L   Chloride 100 98 - 111 mmol/L   CO2 28 22 - 32 mmol/L   Glucose, Bld 121 (H) 70 - 99 mg/dL    Comment: Glucose reference range applies only to samples taken after fasting for at least 8 hours.   BUN 18 8 - 23 mg/dL   Creatinine, Ser 2.05 (H) 0.61 - 1.24 mg/dL   Calcium 8.1 (L) 8.9 - 10.3 mg/dL   GFR, Estimated 33 (L) >60 mL/min    Comment: (NOTE) Calculated using the CKD-EPI Creatinine Equation (2021)    Anion gap 8 5 - 15    Comment: Performed  at [facility], [place] [address] [place], [place] [address]  Glucose, capillary     Status: Abnormal   Collection Time: 11/12/20 11:32 AM  Result Value Ref Range   Glucose-Capillary 122 (H) 70 - 99 mg/dL    Comment: Glucose reference range applies only to samples taken after fasting for at least 8 hours.    Current Facility-Administered Medications  Medication Dose Route Frequency Provider Last Rate Last Admin    acetaminophen (TYLENOL) tablet 650 mg  650 mg Oral Q6H PRN Shela Leff, MD       Or   acetaminophen (TYLENOL) suppository 650 mg  650 mg Rectal Q6H PRN Shela Leff, MD       albuterol (PROVENTIL) (2.5 MG/3ML) 0.083% nebulizer solution 2.5 mg  2.5 mg Nebulization Q3H PRN Anders Simmonds, MD       allopurinol (ZYLOPRIM) tablet 300 mg  300 mg Oral Daily Shela Leff, MD   300 mg at 11/12/20 1049   amLODipine (NORVASC) tablet 10 mg  10 mg Oral Daily Shela Leff, MD   10 mg at 11/12/20 1049   Chlorhexidine Gluconate Cloth 2 % PADS 6 each  6 each Topical Daily Imagene Sheller S, DO   6 each at 11/12/20 1022   citalopram (CELEXA) tablet 20 mg  20 mg Oral Daily Hunsucker, Bonna Gains, MD   20 mg at 11/12/20 1052   dexmedetomidine (PRECEDEX) 400 MCG/100ML (4 mcg/mL) infusion  0.4-1.2 mcg/kg/hr Intravenous Titrated Rigoberto Noel, MD   Stopped at 11/12/20 0841   enoxaparin (LOVENOX) injection 40 mg  40 mg Subcutaneous Q24H Shela Leff, MD   40 mg at 41/32/44 0102   folic acid (FOLVITE) tablet 1 mg  1 mg Oral Daily Shela Leff, MD   1 mg at 11/12/20 1052   insulin aspart (novoLOG) injection 0-5 Units  0-5 Units Subcutaneous QHS Shela Leff, MD       insulin aspart (novoLOG) injection 0-9 Units  0-9 Units Subcutaneous TID WC Shela Leff, MD   1 Units at 11/12/20 1300   lactated ringers infusion   Intravenous Continuous Hunsucker, Bonna Gains, MD       LORazepam (ATIVAN) injection 1-4 mg  1-4 mg Intravenous Q1H PRN Shela Leff, MD   2 mg at 11/11/20 1421   multivitamin with minerals tablet 1 tablet  1 tablet Oral Daily Shela Leff, MD   1 tablet at 11/12/20 1051   pantoprazole (PROTONIX) EC tablet 40 mg  40 mg Oral BID Shela Leff, MD   40 mg at 11/12/20 1051   thiamine tablet 100 mg  100 mg Oral Daily Shela Leff, MD   100 mg at 11/11/20 7253   Or   thiamine (B-1) injection 100 mg  100 mg Intravenous Daily Shela Leff, MD    100 mg at 11/12/20 1020    Musculoskeletal: Strength & Muscle Tone: within normal limits Gait & Station:  remains in bed Patient leans: N/A            Psychiatric Specialty Exam:  Presentation  General Appearance: Appropriate for Environment (very drowsy)  Eye Contact:Poor  Speech:Garbled  Speech Volume:Decreased  Handedness: No data recorded  Mood and Affect  Mood:Euthymic  Affect:Constricted   Thought Process  Thought Processes:Goal Directed  Descriptions of Associations:Intact  Orientation:Partial (was aware of the year but noted that he forgot were he was even though "someone just told me")  Thought Content:Logical  History of Schizophrenia/Schizoaffective disorder:No data recorded Duration of Psychotic Symptoms:No data recorded  Hallucinations:Hallucinations: Auditory  Ideas of Reference:None  Suicidal Thoughts:Suicidal Thoughts: No  Homicidal Thoughts:Homicidal Thoughts: No   Sensorium  Memory:Immediate Poor; Recent Poor; Remote Fair  Judgment:-- (Improving)  Insight:Shallow   Executive Functions  Concentration:Fair  Attention Span:Poor  [place]  Language:Poor   Psychomotor Activity  Psychomotor Activity:Psychomotor Activity: -- (doing well in restraints)   Assets  Assets:Resilience   Sleep  Sleep:Sleep: Fair   Physical Exam: Physical Exam Constitutional:      Appearance: Normal appearance.  HENT:     Head: Normocephalic and atraumatic.  Eyes:     Conjunctiva/sclera: Conjunctivae normal.  Cardiovascular:     Rate and Rhythm: Normal rate.  Pulmonary:     Effort: Pulmonary effort is normal.     Breath sounds: Normal breath sounds.  Abdominal:     General: Abdomen is flat.  Musculoskeletal:        General: Normal range of motion.     Cervical back: Normal range of motion.  Skin:    General: Skin is warm and dry.  Neurological:     Mental Status: He is alert. He is disoriented.      Comments: But easily reoriented   Review of Systems  Constitutional:  Negative for chills and fever.  HENT:  Negative for hearing loss.   Eyes:  Negative for blurred vision.  Respiratory:  Negative for cough and wheezing.   Cardiovascular:  Negative for chest pain.  Gastrointestinal:  Negative for abdominal pain.  Neurological:  Negative for dizziness.  Psychiatric/Behavioral:  Positive for substance abuse. Negative for suicidal ideas.   Blood pressure 109/66, pulse 89, temperature (!) 97.3 F (36.3 C), temperature source Axillary, resp. rate (!) 22, height 5\' 9"  (1.753 m), weight 88.1 kg, SpO2 98 %. Body mass index is 28.68 kg/m.  Treatment Plan Summary: Medication management EtoH use disorder Hx of depression Patient has a hx of depressed mood and was prescribed Celexa 40mg  but his PCP. Patient also has a hx of Low Vit B12 most likely 2/2 to his excessive EtoH intake. Patient's EtoH is likely contributing to his depressed mood as etoh is a depressant but if patient has a low B12 this can also increase risk for depressed mood as well as hypothyroidism. Patient was restarted on his Celexa and this may be beneficial but if patient continues to drink outpatient is his mood will likely remain depressed.  - Can continue Celexa 40mg  - Recommend B12 and TSH - Agree with primary team's plan for [facility] Withdrawal.  Will sign off as patient's symptoms are 2/2 to EtOH withdrawal and he appears to be improving.  Disposition: Refer to IOP. PGY-1 Freida Busman, MD 11/12/2020 2:43 PM

## 2020-11-12 NOTE — Significant Event (Signed)
PCCM Progress Note:   Called to bedside. Patient got out of bed and fell near foot of bed. Head laceration above left eyebrow. By time I arrived relative hemostasis achieved. Neuro exam stable and unchanged from prior. Will contact general surgery for laceration repair. Will order CT head.

## 2020-11-12 NOTE — Progress Notes (Signed)
[facility] Progress Note Patient Name: Tyrone [name] DOB: 06-18-1945 MRN: [id number]   Date of Service  11/12/2020  HPI/Events of Note  Oliguria - Bladder scan with no residual. LVEF = 60-65%.  eICU Interventions  Bolus with 0.9 NaCl 1 liter IV over 1 hour now.      Intervention Category Major Interventions: Other:  Arch, Methot 11/12/2020, 4:14 AM

## 2020-11-12 NOTE — Procedures (Signed)
Procedure Name: Laceration Repair Indication: Reduce risk of infection Location: L eyebrow Pre-Procedure Diagnosis: Laceration Post-Procedure Diagnosis: Repaired Laceration Informed consent was obtained before procedure started. PROCEDURE: The appropriate timeout was taken. The area was prepped and draped in the usual sterile fashion. Local anesthesia was achieved using 5cc of  Lidocaine 1% without epinephrine. The wound was copiously irrigated. #10 5-0 Monocryl interrupted sutures were placed.  Estimated blood loss was less than 0.5 mL. The patient tolerated the procedure well without complications.  Norm Parcel , [facility] Surgery 11/12/2020, 4:58 PM Please see Amion for pager number during day hours 7:00am-4:30pm

## 2020-11-12 NOTE — Progress Notes (Signed)
Was in another patient's room when the secretary came and informed me the patient was on the ground. I went into the room and found the patient on the ground with a laceration above left eyebrow. Patient A&Ox4. Reports no pain. Patient stated he was trying to use the restroom and had used his call bell with no response. MD notified. Family updated and notified. Surgery consulted to place sutures. STAT CT ordered.

## 2020-11-12 NOTE — Consult Note (Signed)
Consult Note  Tyrone [name] [birth year]  [id number].    Requesting MD: Larey Days, MD Chief Complaint/Reason for Consult: Fall with laceration  HPI:  Patient is a 75 year old male who is currently admitted for EtOH withdrawal and agitation. He fell today while trying to get up to the restroom and struck his head. He denies LOC. CT head has been ordered. Trauma consulted for laceration repair.   ROS: Review of Systems  Constitutional:  Negative for chills and fever.  Eyes:  Negative for blurred vision and double vision.  Gastrointestinal:  Negative for abdominal pain, nausea and vomiting.  Musculoskeletal:  Negative for neck pain.  Neurological:  Negative for sensory change, loss of consciousness, weakness and headaches.  All other systems reviewed and are negative.  Family History  Problem Relation Age of Onset   Colon cancer Neg Hx    Esophageal cancer Neg Hx    Rectal cancer Neg Hx    Stomach cancer Neg Hx     Past Medical History:  Diagnosis Date   Anemia    Arthritis    hands   Blood transfusion without reported diagnosis    for stomach ulcers   Depression    Hypertension    Kidney stone    Multiple duodenal ulcers     Past Surgical History:  Procedure Laterality Date   ABDOMINAL SURGERY     ulcers   COLONOSCOPY     LITHOTRIPSY      Social History:  reports that he has quit smoking. He quit smokeless tobacco use about 13 years ago.  His smokeless tobacco use included chew. He reports previous alcohol use. He reports that he does not use drugs.  Allergies: No Known Allergies  Medications Prior to Admission  Medication Sig Dispense Refill   allopurinol (ZYLOPRIM) 300 MG tablet TAKE 1 TABLET BY MOUTH DAILY (Patient taking differently: Take 300 mg by mouth daily.) 90 tablet 4   amLODipine (NORVASC) 10 MG tablet TAKE 1 TABLET BY MOUTH DAILY (Patient taking differently: Take 10 mg by mouth daily.) 90 tablet 2   citalopram (CELEXA) 40 MG tablet  Take 40 mg by mouth daily.     Cyanocobalamin (B-12) 5000 MCG CAPS Take by mouth 2 (two) times daily.     cyclobenzaprine (FLEXERIL) 10 MG tablet Take 1 tablet (10 mg total) by mouth 3 (three) times daily as needed for muscle spasms. 30 tablet 0   folic acid (FOLVITE) 1 MG tablet Take 1 mg by mouth daily.     furosemide (LASIX) 20 MG tablet Take 1 tablet (20 mg total) by mouth daily as needed for edema. 10 tablet 0   losartan (COZAAR) 100 MG tablet Take 100 mg by mouth daily.     metFORMIN (GLUCOPHAGE) 500 MG tablet Take 500 mg by mouth daily.     naproxen sodium (ALEVE) 220 MG tablet Take 440 mg by mouth daily.     pantoprazole (PROTONIX) 40 MG tablet TAKE 1 TABLET BY MOUTH TWICE DAILY (Patient taking differently: Take 40 mg by mouth 2 (two) times daily.) 60 tablet 3   triamcinolone 0.1%-Eucerin equivalent 1:1 cream mixture Apply 1 application topically 2 (two) times daily as needed for itching.     sulfamethoxazole-trimethoprim (BACTRIM DS) 800-160 MG tablet Take 1 tablet by mouth 2 (two) times daily. (Patient not taking: No sig reported) 14 tablet 0    Blood pressure (!) 143/73, pulse 91, temperature (!) 97.3 F (36.3 C), temperature source Axillary,  resp. rate 18, height 5\' 9"  (1.753 m), weight 88.1 kg, SpO2 97 %. Physical Exam:  General: pleasant, WD, elderly male who is laying in bed in NAD HEENT: 8-10 cm laceration to L eyebrow without active bleeding.  Sclera are noninjected.  PERRL.  Ears and nose without any masses or lesions.  Mouth is pink and moist Heart: regular, rate, and rhythm.  Normal s1,s2. No obvious murmurs, gallops, or rubs noted.  Palpable radial and pedal pulses bilaterally Lungs: CTAB, no wheezes, rhonchi, or rales noted.  Respiratory effort nonlabored Abd: soft, NT, ND, +BS, no masses, hernias, or organomegaly MS: all 4 extremities are symmetrical with no cyanosis, clubbing, or edema. Skin: warm and dry with no masses, lesions, or rashes Neuro: Cranial nerves 2-12  grossly intact, sensation is normal throughout Psych: A&Ox4 with an appropriate affect.   Results for orders placed or performed during the hospital encounter of 11/10/20 (from the past 48 hour(s))  Basic metabolic panel     Status: Abnormal   Collection Time: 11/10/20  6:01 PM  Result Value Ref Range   Sodium 134 (L) 135 - 145 mmol/L   Potassium 3.7 3.5 - 5.1 mmol/L   Chloride 96 (L) 98 - 111 mmol/L   CO2 23 22 - 32 mmol/L   Glucose, Bld 131 (H) 70 - 99 mg/dL    Comment: Glucose reference range applies only to samples taken after fasting for at least 8 hours.   BUN 8 8 - 23 mg/dL   Creatinine, Ser 1.46 (H) 0.61 - 1.24 mg/dL   Calcium 9.0 8.9 - 10.3 mg/dL   GFR, Estimated 50 (L) >60 mL/min    Comment: (NOTE) Calculated using the CKD-EPI Creatinine Equation (2021)    Anion gap 15 5 - 15    Comment: Performed at [facility] [address], [place], [place] [address]  CBC     Status: Abnormal   Collection Time: 11/10/20  6:01 PM  Result Value Ref Range   WBC 6.5 4.0 - 10.5 K/uL   RBC 3.68 (L) 4.22 - 5.81 MIL/uL   Hemoglobin 10.4 (L) 13.0 - 17.0 g/dL   HCT 32.9 (L) 39.0 - 52.0 %   MCV 89.4 80.0 - 100.0 fL   MCH 28.3 26.0 - 34.0 pg   MCHC 31.6 30.0 - 36.0 g/dL   RDW 15.7 (H) 11.5 - 15.5 %   Platelets 293 150 - 400 K/uL   nRBC 0.0 0.0 - 0.2 %    Comment: Performed at [facility] [address] [place], [place] [address]  Troponin I (High Sensitivity)     Status: None   Collection Time: 11/10/20  6:01 PM  Result Value Ref Range   Troponin I (High Sensitivity) 17 <18 ng/L    Comment: (NOTE) Elevated high sensitivity troponin I (hsTnI) values and significant  changes across serial measurements may suggest ACS but many other  chronic and acute conditions are known to elevate hsTnI results.  Refer to the "Links" section for chest pain algorithms and additional  guidance. Performed at [facility], [place] [address] [place], [place] [address]   Hepatic  function panel     Status: None   Collection Time: 11/10/20  6:01 PM  Result Value Ref Range   Total Protein 7.4 6.5 - 8.1 g/dL   Albumin 4.1 3.5 - 5.0 g/dL   AST 27 15 - 41 U/L   ALT 24 0 - 44 U/L   Alkaline Phosphatase  88 38 - 126 U/L   Total Bilirubin 1.1 0.3 - 1.2 mg/dL   Bilirubin, Direct 0.2 0.0 - 0.2 mg/dL   Indirect Bilirubin 0.9 0.3 - 0.9 mg/dL    Comment: Performed at [facility] [address], [place], [place] [address]  Brain natriuretic peptide     Status: Abnormal   Collection Time: 11/10/20  6:07 PM  Result Value Ref Range   B Natriuretic Peptide 167.2 (H) 0.0 - 100.0 pg/mL    Comment: Performed at [facility] [address], [place], [place] [address]  Troponin I (High Sensitivity)     Status: Abnormal   Collection Time: 11/10/20  8:11 PM  Result Value Ref Range   Troponin I (High Sensitivity) 19 (H) <18 ng/L    Comment: (NOTE) Elevated high sensitivity troponin I (hsTnI) values and significant  changes across serial measurements may suggest ACS but many other  chronic and acute conditions are known to elevate hsTnI results.  Refer to the "Links" section for chest pain algorithms and additional  guidance. Performed at [facility], [place] [address], [place], [place] [address]   Ethanol     Status: Abnormal   Collection Time: 11/10/20 10:45 PM  Result Value Ref Range   Alcohol, Ethyl (B) 39 (H) <10 mg/dL    Comment: (NOTE) Lowest detectable limit for serum alcohol is 10 mg/dL.  For medical purposes only. Performed at [facility], [place] [address] [place], [place] [address]   Troponin I (High Sensitivity)     Status: Abnormal   Collection Time: 11/10/20 10:45 PM  Result Value Ref Range   Troponin I (High Sensitivity) 18 (H) <18 ng/L    Comment: (NOTE) Elevated high sensitivity troponin I (hsTnI) values and significant  changes across serial measurements may suggest ACS but many other  chronic and acute conditions are known to  elevate hsTnI results.  Refer to the "Links" section for chest pain algorithms and additional  guidance. Performed at [facility], [place] [address] [place], [place] [address]   SARS CORONAVIRUS 2 (TAT 6-24 HRS) Nasopharyngeal Nasopharyngeal Swab     Status: None   Collection Time: 11/10/20 10:56 PM   Specimen: Nasopharyngeal Swab  Result Value Ref Range   SARS Coronavirus 2 NEGATIVE NEGATIVE    Comment: (NOTE) SARS-CoV-2 target nucleic acids are NOT DETECTED.  The SARS-CoV-2 RNA is generally detectable in upper and lower respiratory specimens during the acute phase of infection. Negative results do not preclude SARS-CoV-2 infection, do not rule out co-infections with other pathogens, and should not be used as the sole basis for treatment or other patient management decisions. Negative results must be combined with clinical observations, patient history, and epidemiological information. The expected result is Negative.  Fact Sheet for Patients: SugarRoll.be  Fact Sheet for Healthcare Providers: https://www.woods-mathews.com/  This test is not yet approved or cleared by the [place] FDA and  has been authorized for detection and/or diagnosis of SARS-CoV-2 by FDA under an Emergency Use Authorization (EUA). This EUA will remain  in effect (meaning this test can be used) for the duration of the COVID-19 declaration under Se ction 564(b)(1) of the Act, 21 U.S.C. section 360bbb-3(b)(1), unless the authorization is terminated or revoked sooner.  Performed at [facility], [place] [address], [place], [place] [address]   Magnesium     Status: None   Collection Time: 11/10/20 11:50 PM  Result Value Ref Range  Magnesium 2.0 1.7 - 2.4 mg/dL    Comment: Performed at [facility], [place] [address], [place], [place] [address]  Phosphorus     Status: None   Collection Time: 11/10/20 11:50 PM  Result Value Ref Range   Phosphorus  3.7 2.5 - 4.6 mg/dL    Comment: Performed at [facility], [place] [address] [place], [place] [address]  Hemoglobin A1c     Status: Abnormal   Collection Time: 11/10/20 11:50 PM  Result Value Ref Range   Hgb A1c MFr Bld 6.1 (H) 4.8 - 5.6 %    Comment: (NOTE)         Prediabetes: 5.7 - 6.4         Diabetes: >6.4         Glycemic control for adults with diabetes: <7.0    Mean Plasma Glucose 128 mg/dL    Comment: (NOTE) Performed At: [facility] [place], [place] [id number] Rush Farmer MD BS:[id number]   D-dimer, quantitative     Status: Abnormal   Collection Time: 11/10/20 11:50 PM  Result Value Ref Range   D-Dimer, Quant 1.02 (H) 0.00 - 0.50 ug/mL-FEU    Comment: (NOTE) At the manufacturer cut-off value of 0.5 g/mL FEU, this assay has a negative predictive value of 95-100%.This assay is intended for use in conjunction with a clinical pretest probability (PTP) assessment model to exclude pulmonary embolism (PE) and deep venous thrombosis (DVT) in outpatients suspected of PE or DVT. Results should be correlated with clinical presentation. Performed at [facility], [place] [address] [place], [place] [address]   CBG monitoring, ED     Status: Abnormal   Collection Time: 11/11/20 12:44 AM  Result Value Ref Range   Glucose-Capillary 146 (H) 70 - 99 mg/dL    Comment: Glucose reference range applies only to samples taken after fasting for at least 8 hours.  Glucose, capillary     Status: Abnormal   Collection Time: 11/11/20  6:06 AM  Result Value Ref Range   Glucose-Capillary 135 (H) 70 - 99 mg/dL    Comment: Glucose reference range applies only to samples taken after fasting for at least 8 hours.  Glucose, capillary     Status: Abnormal   Collection Time: 11/11/20 12:11 PM  Result Value Ref Range   Glucose-Capillary 125 (H) 70 - 99 mg/dL    Comment: Glucose reference range applies only to samples taken after fasting for at least 8 hours.    Comment 1 Notify RN   Glucose, capillary     Status: Abnormal   Collection Time: 11/11/20  3:00 PM  Result Value Ref Range   Glucose-Capillary 120 (H) 70 - 99 mg/dL    Comment: Glucose reference range applies only to samples taken after fasting for at least 8 hours.  MRSA Next Gen by PCR, Nasal     Status: None   Collection Time: 11/11/20  3:20 PM   Specimen: Nasal Mucosa; Nasal Swab  Result Value Ref Range   MRSA by PCR Next Gen NOT DETECTED NOT DETECTED    Comment: (NOTE) The GeneXpert MRSA Assay (FDA approved for NASAL specimens only), is one component of a comprehensive MRSA colonization surveillance program. It is not intended to diagnose MRSA infection nor to guide or monitor treatment for MRSA infections. Test performance is not FDA approved in patients less than 10 years old. Performed at [facility], [place] [address] [place], [place], [place] [address]  Glucose, capillary     Status: Abnormal   Collection Time: 11/11/20  7:10 PM  Result Value Ref Range   Glucose-Capillary 125 (H) 70 - 99 mg/dL    Comment: Glucose reference range applies only to samples taken after fasting for at least 8 hours.  I-STAT 7, (LYTES, BLD GAS, ICA, H+H)     Status: Abnormal   Collection Time: 11/11/20 10:53 PM  Result Value Ref Range   pH, Arterial 7.314 (L) 7.350 - 7.450   pCO2 arterial 66.0 (HH) 32.0 - 48.0 mmHg   pO2, Arterial 77 (L) 83.0 - 108.0 mmHg   Bicarbonate 33.8 (H) 20.0 - 28.0 mmol/L   TCO2 36 (H) 22 - 32 mmol/L   O2 Saturation 94.0 %   Acid-Base Excess 6.0 (H) 0.0 - 2.0 mmol/L   Sodium 136 135 - 145 mmol/L   Potassium 3.8 3.5 - 5.1 mmol/L   Calcium, Ion 1.19 1.15 - 1.40 mmol/L   HCT 29.0 (L) 39.0 - 52.0 %   Hemoglobin 9.9 (L) 13.0 - 17.0 g/dL   Patient temperature 97.7 F    Sample type ARTERIAL    Comment NOTIFIED PHYSICIAN   Glucose, capillary     Status: Abnormal   Collection Time: 11/11/20 10:59 PM  Result Value Ref Range   Glucose-Capillary 132 (H) 70 - 99 mg/dL     Comment: Glucose reference range applies only to samples taken after fasting for at least 8 hours.  I-STAT 7, (LYTES, BLD GAS, ICA, H+H)     Status: Abnormal   Collection Time: 11/12/20  2:57 AM  Result Value Ref Range   pH, Arterial 7.446 7.350 - 7.450   pCO2 arterial 44.5 32.0 - 48.0 mmHg   pO2, Arterial 72 (L) 83.0 - 108.0 mmHg   Bicarbonate 30.8 (H) 20.0 - 28.0 mmol/L   TCO2 32 22 - 32 mmol/L   O2 Saturation 95.0 %   Acid-Base Excess 6.0 (H) 0.0 - 2.0 mmol/L   Sodium 136 135 - 145 mmol/L   Potassium 3.7 3.5 - 5.1 mmol/L   Calcium, Ion 1.14 (L) 1.15 - 1.40 mmol/L   HCT 30.0 (L) 39.0 - 52.0 %   Hemoglobin 10.2 (L) 13.0 - 17.0 g/dL   Patient temperature 97.8 F    Sample type ARTERIAL   Glucose, capillary     Status: Abnormal   Collection Time: 11/12/20  4:57 AM  Result Value Ref Range   Glucose-Capillary 124 (H) 70 - 99 mg/dL    Comment: Glucose reference range applies only to samples taken after fasting for at least 8 hours.  Glucose, capillary     Status: Abnormal   Collection Time: 11/12/20  7:29 AM  Result Value Ref Range   Glucose-Capillary 117 (H) 70 - 99 mg/dL    Comment: Glucose reference range applies only to samples taken after fasting for at least 8 hours.  Basic metabolic panel     Status: Abnormal   Collection Time: 11/12/20  8:22 AM  Result Value Ref Range   Sodium 136 135 - 145 mmol/L   Potassium 3.9 3.5 - 5.1 mmol/L   Chloride 100 98 - 111 mmol/L   CO2 28 22 - 32 mmol/L   Glucose, Bld 121 (H) 70 - 99 mg/dL    Comment: Glucose reference range applies only to samples taken after fasting for at least 8 hours.   BUN 18 8 - 23 mg/dL   Creatinine, Ser 2.05 (H) 0.61 - 1.24 mg/dL   Calcium 8.1 (  L) 8.9 - 10.3 mg/dL   GFR, Estimated 33 (L) >60 mL/min    Comment: (NOTE) Calculated using the CKD-EPI Creatinine Equation (2021)    Anion gap 8 5 - 15    Comment: Performed at [facility], [place] [address], [place], [place] [address]  Glucose, capillary     Status:  Abnormal   Collection Time: 11/12/20 11:32 AM  Result Value Ref Range   Glucose-Capillary 122 (H) 70 - 99 mg/dL    Comment: Glucose reference range applies only to samples taken after fasting for at least 8 hours.  Glucose, capillary     Status: Abnormal   Collection Time: 11/12/20  3:59 PM  Result Value Ref Range   Glucose-Capillary 131 (H) 70 - 99 mg/dL    Comment: Glucose reference range applies only to samples taken after fasting for at least 8 hours.   DG Chest 2 View  Result Date: 11/10/2020 CLINICAL DATA:  Chest pain EXAM: CHEST - 2 VIEW COMPARISON:  10/11/2017 FINDINGS: The heart size and mediastinal contours are within normal limits. Both lungs are clear. Disc degenerative disease of the thoracic spine. IMPRESSION: No acute abnormality of the lungs. Electronically Signed   By: Eddie Candle M.D.   On: 11/10/2020 19:05   DG CHEST PORT 1 VIEW  Result Date: 11/11/2020 CLINICAL DATA:  Wheezing EXAM: PORTABLE CHEST 1 VIEW COMPARISON:  11/10/2020 FINDINGS: Very low lung volumes. Bibasilar atelectasis. Heart is normal size. No effusions. No acute bony abnormality. IMPRESSION: Low lung volumes with bibasilar atelectasis. Electronically Signed   By: Rolm Baptise M.D.   On: 11/11/2020 23:00   ECHOCARDIOGRAM COMPLETE  Result Date: 11/11/2020    ECHOCARDIOGRAM REPORT   Patient Name:   EWING FANDINO Date of Exam: 11/11/2020 Medical Rec #:  [id number]         Height:       69.0 in Accession #:    [id number]        Weight:       198.2 lb Date of Birth:  1945-09-09         BSA:          2.058 m Patient Age:    56 years          BP:           116/71 mmHg Patient Gender: M                 HR:           99 bpm. Exam Location:  Inpatient Procedure: 2D Echo, Cardiac Doppler and Color Doppler Indications:    CHF - Acute systolic  History:        Patient has no prior history of Echocardiogram examinations.                 Signs/Symptoms:Chest Pain and Dyspnea; Risk Factors:Hypertension                 and  Former Smoker. GERD. ETOH abuse.  Sonographer:    Clayton Lefort RDCS (AE) Referring Phys: [id number] [facility]  Sonographer Comments: Image acquisition challenging due to uncooperative patient. Unable to complete full echocardiogram due to patient being uncooperative, repeatedly trying to get out of bed during exam. After first attempt nurse notified. Nurse gave medication, and I completed another echocardiogram. Upon returning to patient room to attempt to continue exam, patient with sitter still trying to get out of bed. IMPRESSIONS  1. The patient was not cooperative  during the study and the study was unable to be completed. Grossly there is normal RV/LV function, but further assessment was not performed due to the patient's inability to comply with the examination.  2. Left ventricular ejection fraction, by estimation, is 60 to 65%. The left ventricle has normal function.  3. Right ventricular systolic function is normal. The right ventricular size is normal.  4. The aortic valve is tricuspid. Aortic valve regurgitation is not visualized. No aortic stenosis is present. FINDINGS  Left Ventricle: Left ventricular ejection fraction, by estimation, is 60 to 65%. The left ventricle has normal function. The left ventricular internal cavity size was normal in size. There is no left ventricular hypertrophy. Right Ventricle: The right ventricular size is normal. No increase in right ventricular wall thickness. Right ventricular systolic function is normal. Pericardium: There is no evidence of pericardial effusion. Presence of pericardial fat pad. Tricuspid Valve: The tricuspid valve is grossly normal. Tricuspid valve regurgitation is trivial. No evidence of tricuspid stenosis. Aortic Valve: The aortic valve is tricuspid. Aortic valve regurgitation is not visualized. No aortic stenosis is present. Pulmonic Valve: The pulmonic valve was grossly normal. Pulmonic valve regurgitation is trivial. No evidence of pulmonic  stenosis. Aorta: The aortic root and ascending aorta are structurally normal, with no evidence of dilitation.  LEFT VENTRICLE PLAX 2D LVIDd:         4.30 cm LVIDs:         3.00 cm LV PW:         0.92 cm LV IVS:        1.11 cm LVOT diam:     2.20 cm LVOT Area:     3.80 cm  LEFT ATRIUM         Index LA diam:    3.70 cm 1.80 cm/m   AORTA Ao Root diam: 3.80 cm Ao Asc diam:  3.70 cm  SHUNTS Systemic Diam: 2.20 cm Eleonore Chiquito MD Electronically signed by Eleonore Chiquito MD Signature Date/Time: 11/11/2020/10:00:23 AM    Final       Assessment/Plan Fall  L eyebrow laceration - closed with dissolvable sutures   EtOH withdrawal - per primary team   CT head pending. Patient is alert and oriented x4. If clear, then trauma will sign off.   Norm Parcel, [facility] 11/12/2020, 5:00 PM Please see Amion for pager number during day hours 7:00am-4:30pm

## 2020-11-13 DIAGNOSIS — F10931 Alcohol use, unspecified with withdrawal delirium: Secondary | ICD-10-CM

## 2020-11-13 DIAGNOSIS — G9340 Encephalopathy, unspecified: Secondary | ICD-10-CM

## 2020-11-13 DIAGNOSIS — F10231 Alcohol dependence with withdrawal delirium: Secondary | ICD-10-CM

## 2020-11-13 LAB — COMPREHENSIVE METABOLIC PANEL
ALT: 28 U/L (ref 0–44)
AST: 54 U/L — ABNORMAL HIGH (ref 15–41)
Albumin: 3.1 g/dL — ABNORMAL LOW (ref 3.5–5.0)
Alkaline Phosphatase: 86 U/L (ref 38–126)
Anion gap: 12 (ref 5–15)
BUN: 14 mg/dL (ref 8–23)
CO2: 26 mmol/L (ref 22–32)
Calcium: 8.1 mg/dL — ABNORMAL LOW (ref 8.9–10.3)
Chloride: 95 mmol/L — ABNORMAL LOW (ref 98–111)
Creatinine, Ser: 1.32 mg/dL — ABNORMAL HIGH (ref 0.61–1.24)
GFR, Estimated: 56 mL/min — ABNORMAL LOW (ref >60)
Glucose, Bld: 133 mg/dL — ABNORMAL HIGH (ref 70–99)
Potassium: 3.4 mmol/L — ABNORMAL LOW (ref 3.5–5.1)
Sodium: 133 mmol/L — ABNORMAL LOW (ref 135–145)
Total Bilirubin: 0.9 mg/dL (ref 0.3–1.2)
Total Protein: 5.5 g/dL — ABNORMAL LOW (ref 6.5–8.1)

## 2020-11-13 LAB — GLUCOSE, CAPILLARY
Glucose-Capillary: 150 mg/dL — ABNORMAL HIGH (ref 70–99)
Glucose-Capillary: 103 mg/dL — ABNORMAL HIGH (ref 70–99)
Glucose-Capillary: 87 mg/dL (ref 70–99)
Glucose-Capillary: 110 mg/dL — ABNORMAL HIGH (ref 70–99)

## 2020-11-13 LAB — CBC
HCT: 26 % — ABNORMAL LOW (ref 39.0–52.0)
Hemoglobin: 7.9 g/dL — ABNORMAL LOW (ref 13.0–17.0)
MCH: 28 pg (ref 26.0–34.0)
MCHC: 30.4 g/dL (ref 30.0–36.0)
MCV: 92.2 fL (ref 80.0–100.0)
Platelets: 198 10*3/uL (ref 150–400)
RBC: 2.82 MIL/uL — ABNORMAL LOW (ref 4.22–5.81)
RDW: 15.3 % (ref 11.5–15.5)
WBC: 8.4 10*3/uL (ref 4.0–10.5)
nRBC: 0 % (ref 0.0–0.2)

## 2020-11-13 LAB — MAGNESIUM: Magnesium: 2.1 mg/dL (ref 1.7–2.4)

## 2020-11-13 LAB — MRSA NEXT GEN BY PCR, NASAL: MRSA by PCR Next Gen: NOT DETECTED

## 2020-11-13 LAB — TROPONIN I (HIGH SENSITIVITY): Troponin I (High Sensitivity): 24 ng/L — ABNORMAL HIGH (ref <18)

## 2020-11-13 LAB — LACTIC ACID, PLASMA: Lactic Acid, Venous: 0.7 mmol/L (ref 0.5–1.9)

## 2020-11-13 LAB — PHOSPHORUS: Phosphorus: 3.5 mg/dL (ref 2.5–4.6)

## 2020-11-13 MED ORDER — LACTATED RINGERS IV BOLUS
1000.0000 mL | Freq: Once | INTRAVENOUS | Status: AC
  Administered 2020-11-13: 1000 mL via INTRAVENOUS

## 2020-11-13 MED ORDER — DEXTROSE IN LACTATED RINGERS 5 % IV SOLN: INTRAVENOUS | Status: DC

## 2020-11-13 MED ORDER — CLONIDINE HCL 0.1 MG PO TABS: 0.1000 mg | ORAL_TABLET | Freq: Four times a day (QID) | ORAL | Status: DC

## 2020-11-13 MED ORDER — CLONIDINE HCL 0.1 MG PO TABS: 0.1000 mg | ORAL_TABLET | ORAL | Status: DC

## 2020-11-13 MED ORDER — CLONIDINE HCL 0.1 MG PO TABS: 0.1000 mg | ORAL_TABLET | Freq: Every day | ORAL | Status: DC

## 2020-11-13 MED ORDER — PANTOPRAZOLE SODIUM 40 MG IV SOLR
40.0000 mg | Freq: Two times a day (BID) | INTRAVENOUS | Status: DC
  Administered 2020-11-13: 40 mg via INTRAVENOUS
  Filled 2020-11-13: qty 40

## 2020-11-13 MED ORDER — DEXMEDETOMIDINE HCL IN NACL 400 MCG/100ML IV SOLN
0.0000 ug/kg/h | INTRAVENOUS | Status: DC
  Administered 2020-11-13: 0.4 ug/kg/h via INTRAVENOUS
  Administered 2020-11-13: 1.1 ug/kg/h via INTRAVENOUS
  Filled 2020-11-13 (×2): qty 100

## 2020-11-13 MED ORDER — CLONIDINE HCL 0.2 MG PO TABS
0.2000 mg | ORAL_TABLET | Freq: Four times a day (QID) | ORAL | Status: DC
  Administered 2020-11-13 (×2): 0.2 mg via ORAL
  Filled 2020-11-13 (×2): qty 1

## 2020-11-13 MED ORDER — PANTOPRAZOLE SODIUM 40 MG PO TBEC
40.0000 mg | DELAYED_RELEASE_TABLET | Freq: Two times a day (BID) | ORAL | Status: DC
  Administered 2020-11-13 – 2020-11-20 (×14): 40 mg via ORAL
  Filled 2020-11-13 (×14): qty 1

## 2020-11-13 MED ORDER — LACTATED RINGERS IV SOLN: INTRAVENOUS | Status: DC

## 2020-11-13 MED ORDER — FOLIC ACID 1 MG PO TABS
1.0000 mg | ORAL_TABLET | Freq: Every day | ORAL | Status: DC
  Administered 2020-11-14 – 2020-11-20 (×7): 1 mg via ORAL
  Filled 2020-11-13 (×7): qty 1

## 2020-11-13 MED ORDER — FOLIC ACID 5 MG/ML IJ SOLN
1.0000 mg | Freq: Every day | INTRAMUSCULAR | Status: DC
  Administered 2020-11-13: 1 mg via INTRAVENOUS
  Filled 2020-11-13: qty 0.2

## 2020-11-13 NOTE — Progress Notes (Signed)
[employer] Progress Note Patient Name: Tyrone [name] DOB: August 28, 1945 MRN: [id number]   Date of Service  11/13/2020  HPI/Events of Note  Agitation - Currently on CIWA protocol. Nursing request for 1:1 safety sitter   eICU Interventions  Plan: 1:1 Safety sitter X 12 hours. Will request that ground team evaluate the patient at bedside.      Intervention Category Major Interventions: Delirium, psychosis, severe agitation - evaluation and management  Oval Moralez,Olof Eugene 11/13/2020, 2:02 AM

## 2020-11-13 NOTE — Progress Notes (Signed)
Patient continues to have to be directed by staff to stay in bed.  Continuing with CIWA protocol.  [facility] contacted regarding sitter order.  Patient has required a staff member in the room the entire night for safety.

## 2020-11-13 NOTE — Progress Notes (Addendum)
Name: Tyrone [name] MRN: [id number] DOB: [birth year]    ADMISSION DATE:  11/10/2020 CONSULTATION DATE:  11/13/2020 REFERRING MD :  Welford Roche  CHIEF COMPLAINT: Alcohol withdrawal, agitation BRIEF: 75 year old heavy EtOH user, diabetic, hypertensive admitted overnight with 1 month history of dyspnea on exertion, bilateral lower extremity edema.  History is obtained from chart review since patient is unable to provide.  He is only oriented to name currently.  His EtOH level was 39 on admission, BNP was 167 chest x-ray was clear, no hypoxia .  He was given 1 mg of Ativan in the ED and admitted with a presumptive diagnosis of CHF. He required more doses of Ativan since 4 AM and has required 6 mg of Ativan in the last 6 hours.  PCCM consulted for increased agitation  has a past medical history of Anemia, Arthritis, Blood transfusion without reported diagnosis, Depression, Hypertension, Kidney stone, and Multiple duodenal ulcers.   has a past surgical history that includes Lithotripsy; Abdominal surgery; and Colonoscopy.   EVENTS    6/25 admission for dyspnea, suspected CHF 6/26 transferred to ICU for encephalopathy, felt to be EtOH withdrawal. Echo 6/26 normal LV function    SUBJECTIVE/OVERNIGHT/INTERVAL HX   6/28 - moved to floor 3e 6/27 pm. CT head after bump in head on 11/12/20 pm without intracranial issues. In floor needing sitter + RN reorientation frequently and ativan total 7mg  (at least 1mg  Q1h). Confused and agiated. Had worsening AKI yesterdy. AFebrile  VITAL SIGNS: Temp:  [97.3 F (36.3 C)-98.8 F (37.1 C)] 98.8 F (37.1 C) (06/27 2312) Pulse Rate:  [48-100] 98 (06/27 2312) Resp:  [14-23] 16 (06/27 2312) BP: (93-143)/(56-84) 139/84 (06/27 2312) SpO2:  [88 %-99 %] 99 % (06/27 2312) Weight:  [86 kg-89.1 kg] 86 kg (06/28 0056)  PHYSICAL EXAMINATION: General Appearance:  Looks criticall ill OBESE - no Head:  Normocephalic, without obvious abnormality, atraumatic except  left forehead abrasion + Eyes:  PERRL - not examined, conjunctiva/corneas - muddy     Ears:  Normal external ear canals, both ears Nose:  G tube - no Throat:  ETT TUBE - no , OG tube - no Neck:  Supple,  No enlargement/tenderness/nodules Lungs: Clear to auscultation bilaterally,  Heart:  S1 and S2 normal, no murmur, CVP - `x.  Pressors - no Abdomen:  Soft, no masses, no organomegaly Genitalia / Rectal:  Not done Extremities:  Extremities- intact Skin:  ntact in exposed areas . Sacral area - not examined Neurologic:  Sedation - ativan prn -> RASS -2 to +3 . Moves all 4s - yes. CAM-ICU - positive for delirum . Orientation - not   LABS    PULMONARY Recent Labs  Lab 11/11/20 2253 11/12/20 0257  PHART 7.314* 7.446  PCO2ART 66.0* 44.5  PO2ART 77* 72*  HCO3 33.8* 30.8*  TCO2 36* 32  O2SAT 94.0 95.0    CBC Recent Labs  Lab 11/10/20 1801 11/11/20 2253 11/12/20 0257  HGB 10.4* 9.9* 10.2*  HCT 32.9* 29.0* 30.0*  WBC 6.5  --   --   PLT 293  --   --     COAGULATION No results for input(s): INR in the last 168 hours.  CARDIAC  No results for input(s): TROPONINI in the last 168 hours. No results for input(s): PROBNP in the last 168 hours.   CHEMISTRY Recent Labs  Lab 11/10/20 1801 11/10/20 2350 11/11/20 2253 11/12/20 0257 11/12/20 0822  NA 134*  --  136 136 136  K 3.7  --  3.8 3.7 3.9  CL 96*  --   --   --  100  CO2 23  --   --   --  28  GLUCOSE 131*  --   --   --  121*  BUN 8  --   --   --  18  CREATININE 1.46*  --   --   --  2.05*  CALCIUM 9.0  --   --   --  8.1*  MG  --  2.0  --   --   --   PHOS  --  3.7  --   --   --    Estimated Creatinine Clearance: 33.8 mL/min (A) (by C-G formula based on SCr of 2.05 mg/dL (H)).   LIVER Recent Labs  Lab 11/10/20 1801  AST 27  ALT 24  ALKPHOS 88  BILITOT 1.1  PROT 7.4  ALBUMIN 4.1     INFECTIOUS No results for input(s): LATICACIDVEN, PROCALCITON in the last 168 hours.   ENDOCRINE CBG (last 3)  Recent  Labs    11/12/20 1132 11/12/20 1559 11/12/20 2052  GLUCAP 122* 131* 104*         IMAGING x48h  - image(s) personally visualized  -   highlighted in bold CT HEAD WO CONTRAST  Result Date: 11/12/2020 CLINICAL DATA:  Head trauma.  Fall. EXAM: CT HEAD WITHOUT CONTRAST TECHNIQUE: Contiguous axial images were obtained from the base of the skull through the vertex without intravenous contrast. COMPARISON:  None. FINDINGS: Brain: Mild age related volume loss. No evidence of old or recent focal infarction, mass lesion, hemorrhage, hydrocephalus or extra-axial collection. Vascular: There is atherosclerotic calcification of the major vessels at the base of the brain. Skull: No skull fracture. Sinuses/Orbits: Chronic sinusitis of the left maxillary sinus. Few opacified ethmoid air cells on the left. Orbits are negative. Other: Left forehead and frontal scalp injury. IMPRESSION: Left forehead and frontal scalp injury. No underlying skull fracture. No traumatic intracranial finding. Chronic left maxillary sinusitis. Electronically Signed   By: Nelson Chimes M.D.   On: 11/12/2020 18:33   DG CHEST PORT 1 VIEW  Result Date: 11/11/2020 CLINICAL DATA:  Wheezing EXAM: PORTABLE CHEST 1 VIEW COMPARISON:  11/10/2020 FINDINGS: Very low lung volumes. Bibasilar atelectasis. Heart is normal size. No effusions. No acute bony abnormality. IMPRESSION: Low lung volumes with bibasilar atelectasis. Electronically Signed   By: Rolm Baptise M.D.   On: 11/11/2020 23:00   ECHOCARDIOGRAM COMPLETE  Result Date: 11/11/2020    ECHOCARDIOGRAM REPORT   Patient Name:   Tyrone [name] Date of Exam: 11/11/2020 Medical Rec #:  [id number]         Height:       69.0 in Accession #:    [id number]        Weight:       198.2 lb Date of Birth:  1945-08-01         BSA:          2.058 m Patient Age:    56 years          BP:           116/71 mmHg Patient Gender: M                 HR:           99 bpm. Exam Location:  Inpatient Procedure: 2D Echo,  Cardiac Doppler and Color Doppler Indications:    CHF - Acute systolic  History:  Patient has no prior history of Echocardiogram examinations.                 Signs/Symptoms:Chest Pain and Dyspnea; Risk Factors:Hypertension                 and Former Smoker. GERD. ETOH abuse.  Sonographer:    Clayton Lefort RDCS (AE) Referring Phys: [id number] [facility]  Sonographer Comments: Image acquisition challenging due to uncooperative patient. Unable to complete full echocardiogram due to patient being uncooperative, repeatedly trying to get out of bed during exam. After first attempt nurse notified. Nurse gave medication, and I completed another echocardiogram. Upon returning to patient room to attempt to continue exam, patient with sitter still trying to get out of bed. IMPRESSIONS  1. The patient was not cooperative during the study and the study was unable to be completed. Grossly there is normal RV/LV function, but further assessment was not performed due to the patient's inability to comply with the examination.  2. Left ventricular ejection fraction, by estimation, is 60 to 65%. The left ventricle has normal function.  3. Right ventricular systolic function is normal. The right ventricular size is normal.  4. The aortic valve is tricuspid. Aortic valve regurgitation is not visualized. No aortic stenosis is present. FINDINGS  Left Ventricle: Left ventricular ejection fraction, by estimation, is 60 to 65%. The left ventricle has normal function. The left ventricular internal cavity size was normal in size. There is no left ventricular hypertrophy. Right Ventricle: The right ventricular size is normal. No increase in right ventricular wall thickness. Right ventricular systolic function is normal. Pericardium: There is no evidence of pericardial effusion. Presence of pericardial fat pad. Tricuspid Valve: The tricuspid valve is grossly normal. Tricuspid valve regurgitation is trivial. No evidence of tricuspid  stenosis. Aortic Valve: The aortic valve is tricuspid. Aortic valve regurgitation is not visualized. No aortic stenosis is present. Pulmonic Valve: The pulmonic valve was grossly normal. Pulmonic valve regurgitation is trivial. No evidence of pulmonic stenosis. Aorta: The aortic root and ascending aorta are structurally normal, with no evidence of dilitation.  LEFT VENTRICLE PLAX 2D LVIDd:         4.30 cm LVIDs:         3.00 cm LV PW:         0.92 cm LV IVS:        1.11 cm LVOT diam:     2.20 cm LVOT Area:     3.80 cm  LEFT ATRIUM         Index LA diam:    3.70 cm 1.80 cm/m   AORTA Ao Root diam: 3.80 cm Ao Asc diam:  3.70 cm  SHUNTS Systemic Diam: 2.20 cm Eleonore Chiquito MD Electronically signed by Eleonore Chiquito MD Signature Date/Time: 11/11/2020/10:00:23 AM    Final      ASSESSMENT / PLAN:  A:  AT risk for intubation due to agitated encephalopathy  11/13/2020 -> currently RA and protecting airway  P:   ICU monitoring    A:   Acute agitated encephalopathy with suspect etoh wd . Soft tissue head injury 11/12/20. CT head without bleed  P:   Mov eto ICU PRecedex gtt Atirvan prn Sitter Might need repeat head ct baesd on course Thiamine Folic acoid  Hypertension  Plan  Amlodipine if he can take po  A:  AKI 11/12/20 P:  Hydrate and recheck    GASTROINTESTINAL A:   NPO  P:   NPO  HEMATOLOGIC   -  HEME A:  Anemia of ICU   P:  - PRBC for hgb </= 6.9gm%    - exceptions are   -  if ACS susepcted/confirmed then transfuse for hgb </= 8.0gm%,  or    -  active bleeding with hemodynamic instability, then transfuse regardless of hemoglobin value   At at all times try to transfuse 1 unit prbc as possible with exception of active hemorrhage   Best Practice (right click and "Reselect all SmartList Selections" daily)   Diet/type: NPO w/ oral meds -> might need to go to NPO 100% Pain/Anxiety/Delirium protocol Not indicated VAP protocol (if indicated): Not indicated DVT  prophylaxis: LMWH GI prophylaxis: PPI Glucose control:  not indicated Lines: N/A Foley:  N/A Culture data pending:none  Antibiotics:not indicated.  Antibiotic de-escalation: stop date ordered.  Code Status:  full code Last date of multidisciplinary goals of care discussion [n/a] Disposition: admit to the intensive care -0 move back to ICU     ATTESTATION & SIGNATURE   The patient Tyrone [name] is critically ill with multiple organ systems failure and requires high complexity decision making for assessment and support, frequent evaluation and titration of therapies, application of advanced monitoring technologies and extensive interpretation of multiple databases.   Critical Care Time devoted to patient care services described in this note is  35  Minutes. This time reflects time of care of this signee Dr Brand Males. This critical care time does not reflect procedure time, or teaching time or supervisory time of PA/NP/Med student/Med Resident etc but could involve care discussion time     Dr. Brand Males, M.D., [facility] Pulmonary and Critical Care Medicine Staff Physician [place] [facility] Pager: [phone], If no answer or between  15:00h - 7:00h: call [phone]  11/13/2020 2:56 AM

## 2020-11-13 NOTE — Significant Event (Signed)
Rapid Response Event Note   Reason for Call :  Agitation  Initial Focused Assessment:  Pt sitting on side of bed trying to get up. He is alert x 1. He is very difficult to redirect. CIWA-12.  T-98.8, HR-98, BP-139/84, RR-17, SpO2-99% on RA.  Pt received 1mg  ativan at 2129.   Interventions:  2mg  Ativan IV given per CIWA protocol  Plan of Care:  Pt resting in bed after ativan given. Continue to follow CIWA protocol. Call RRT if further assistance needed.    Event Summary:   MD Notified: Bedside RN awaiting call back from MD Call [place] Time:2300  Dillard Essex, RN

## 2020-11-13 NOTE — Progress Notes (Signed)
Discussed with Dr. Marcelle Smiling was transferred back to ICU overnight for continued agitation and alcohol withdrawal.  Currently placed back on Precedex.  TRH will sign off for now, and willing to accept patient when out of ICU.   Brent Noto D.O. [facility] If 7PM-7AM, please contact night-coverage [email or web address] 11/13/2020, 10:16 AM

## 2020-11-13 NOTE — Progress Notes (Addendum)
Name: Tyrone [name] MRN: [id number] DOB: 06/14/1945    ADMISSION DATE:  11/10/2020 CONSULTATION DATE:  11/13/2020 REFERRING MD :  Welford Roche  CHIEF COMPLAINT: Alcohol withdrawal, agitation BRIEF: 75 year old heavy EtOH user, diabetic, hypertensive admitted overnight with 1 month history of dyspnea on exertion, bilateral lower extremity edema.  History is obtained from chart review since patient is unable to provide.  He is only oriented to name currently.  His EtOH level was 39 on admission, BNP was 167 chest x-ray was clear, no hypoxia .  He was given 1 mg of Ativan in the ED and admitted with a presumptive diagnosis of CHF. He required more doses of Ativan since 4 AM and has required 6 mg of Ativan in the last 6 hours.  PCCM consulted for increased agitation  has a past medical history of Anemia, Arthritis, Blood transfusion without reported diagnosis, Depression, Hypertension, Kidney stone, and Multiple duodenal ulcers.   has a past surgical history that includes Lithotripsy; Abdominal surgery; and Colonoscopy.   EVENTS  6/25 admission for dyspnea, suspected CHF 6/26 transferred to ICU for encephalopathy, felt to be EtOH withdrawal. Echo 6/26 normal LV function 6/27 Fell while getting out of bed, had left forehead laceration which was repaired by general surgery.  Later started on precedex 6/28 precedex weaning and starting clonidine taper  SUBJECTIVE/OVERNIGHT/INTERVAL HX  Not arouseable this AM.  Precedex being weaned off.  Was on 1.2 overnight then down to 0.4 this AM. Have asked RN to wean further. Will start clonidine taper.  VITAL SIGNS: Temp:  [98.4 F (36.9 C)-99.2 F (37.3 C)] 99.2 F (37.3 C) (06/28 0300) Pulse Rate:  [48-100] 57 (06/28 0700) Resp:  [14-22] 14 (06/28 0700) BP: (94-143)/(58-84) 118/58 (06/28 0700) SpO2:  [89 %-99 %] 94 % (06/28 0700) Weight:  [86 kg-89.1 kg] 86.4 kg (06/28 0300)  PHYSICAL EXAMINATION: General: Adult male, resting in bed, in  NAD. Neuro: Somnolent, not easily arouseable on precedex at 0.4. HEENT: Sclerae anicteric. Left frontal laceration which has been repaired with sutures. Cardiovascular: RRR, no M/R/G.  Lungs: Respirations even and unlabored.  CTA bilaterally, No W/R/R. Abdomen: BS x 4, soft, NT/ND.  Musculoskeletal: No gross deformities, no edema.  Skin: Intact, warm, no rashes.   ASSESSMENT / PLAN:   Alcohol Withdrawal with acute encephalopathy. - Wean precedex to off. - Start Clonidine taper if can get to take PO.  If not, will hold off for today. - Continue Ativan per CIWA. - Thiamine / Folate. - EtOH cessation counseling when able.  Hypokalemia - s/p repletion. Hyponatremia. - Gentle hydration. - Follow BMP.  Hx Depression. - Continue Celexa. - EtOH cessation counseling when able.  Left frontal forehead laceration - s/p repair with dissolvable sutures per CCS. - Keep wound clean with no further interventions required.  Hx HTN. - Continue Amlodipine.   Anemia. - Transfuse for Hgb < 7.    Best Practice (right click and "Reselect all SmartList Selections" daily)   Diet/type: NPO w/ oral meds Pain/Anxiety/Delirium protocol Not indicated VAP protocol (if indicated): Not indicated DVT prophylaxis: LMWH GI prophylaxis: PPI Glucose control:  not indicated Lines: N/A Foley:  N/A Culture data pending:none  Antibiotics:not indicated.  Antibiotic de-escalation: NA Code Status:  full code Last date of multidisciplinary goals of care discussion [n/a] Disposition: Continue current level of care  - can transfer out if can get off Precedex.   CC time: 35 min.   Montey Hora, [place] [facility]  For pager details, please see AMION or use Epic chat  After 1900, please call [place] for cross coverage needs 11/13/2020, 9:48 AM

## 2020-11-13 NOTE — Progress Notes (Addendum)
Report given and patient taken to 2H13. Patient's wife, Emauri Krygier called and updated of patient location.

## 2020-11-13 NOTE — TOC CAGE-AID Note (Signed)
Transition of Care [facility]) - CAGE-AID Screening   Patient Details  Name: Tyrone [name] MRN: [id number] Date of Birth: May 12, 1946    Dia Crawford, RN Phone [phone]  Clinical Narrative:  Pt denies current tobacco usage, denies drug usage. Pt reports he does drink etoh daily, pt is on CIWA protocol, denies s/s of withdrawal at this time. When ask if he would like resources for cessation pt declined but pt began talking about arthritis pain. Pt denies ever having seizure activity with etoh cessation. A & O. Pt again offered resources, advised we can provide them at any time.  Primary at nurse at bedside.   CAGE-AID Screening: Substance Abuse Screening unable to be completed due to: : Patient Refused  Have You Ever Felt You Ought to Cut Down on Your Drinking or Drug Use?: Yes Have People Annoyed You By Critizing Your Drinking Or Drug Use?: No Have You Felt Bad Or Guilty About Your Drinking Or Drug Use?: No Have You Ever Had a Drink or Used Drugs First Thing In The Morning to Steady Your Nerves or to Get Rid of a Hangover?: Yes CAGE-AID Score: 2  Substance Abuse Education Offered: Yes  Substance abuse interventions: Patient Counseling

## 2020-11-14 LAB — GLUCOSE, CAPILLARY
Glucose-Capillary: 107 mg/dL — ABNORMAL HIGH (ref 70–99)
Glucose-Capillary: 127 mg/dL — ABNORMAL HIGH (ref 70–99)
Glucose-Capillary: 171 mg/dL — ABNORMAL HIGH (ref 70–99)
Glucose-Capillary: 103 mg/dL — ABNORMAL HIGH (ref 70–99)

## 2020-11-14 LAB — CBC
HCT: 24.5 % — ABNORMAL LOW (ref 39.0–52.0)
Hemoglobin: 7.6 g/dL — ABNORMAL LOW (ref 13.0–17.0)
MCH: 28.5 pg (ref 26.0–34.0)
MCHC: 31 g/dL (ref 30.0–36.0)
MCV: 91.8 fL (ref 80.0–100.0)
Platelets: 181 10*3/uL (ref 150–400)
RBC: 2.67 MIL/uL — ABNORMAL LOW (ref 4.22–5.81)
RDW: 15.3 % (ref 11.5–15.5)
WBC: 6.1 10*3/uL (ref 4.0–10.5)
nRBC: 0 % (ref 0.0–0.2)

## 2020-11-14 LAB — BASIC METABOLIC PANEL
Anion gap: 5 (ref 5–15)
BUN: 13 mg/dL (ref 8–23)
CO2: 28 mmol/L (ref 22–32)
Calcium: 8.3 mg/dL — ABNORMAL LOW (ref 8.9–10.3)
Chloride: 99 mmol/L (ref 98–111)
Creatinine, Ser: 1.24 mg/dL (ref 0.61–1.24)
GFR, Estimated: >60 mL/min (ref >60)
Glucose, Bld: 100 mg/dL — ABNORMAL HIGH (ref 70–99)
Potassium: 3.6 mmol/L (ref 3.5–5.1)
Sodium: 132 mmol/L — ABNORMAL LOW (ref 135–145)

## 2020-11-14 MED ORDER — CLONIDINE HCL 0.1 MG PO TABS: 0.1000 mg | ORAL_TABLET | Freq: Four times a day (QID) | ORAL | Status: DC

## 2020-11-14 MED ORDER — CLONIDINE HCL 0.1 MG PO TABS
0.1000 mg | ORAL_TABLET | Freq: Two times a day (BID) | ORAL | Status: AC
  Administered 2020-11-15 (×2): 0.1 mg via ORAL
  Filled 2020-11-14 (×2): qty 1

## 2020-11-14 MED ORDER — CLONIDINE HCL 0.2 MG PO TABS
0.2000 mg | ORAL_TABLET | Freq: Every day | ORAL | Status: AC
  Administered 2020-11-16: 0.2 mg via ORAL
  Filled 2020-11-14: qty 1

## 2020-11-14 MED ORDER — CLONIDINE HCL 0.1 MG PO TABS
0.1000 mg | ORAL_TABLET | Freq: Four times a day (QID) | ORAL | Status: AC
  Administered 2020-11-14 (×4): 0.1 mg via ORAL
  Filled 2020-11-14 (×4): qty 1

## 2020-11-14 MED ORDER — AMLODIPINE BESYLATE 10 MG PO TABS
10.0000 mg | ORAL_TABLET | Freq: Every day | ORAL | Status: DC
  Administered 2020-11-17 – 2020-11-20 (×4): 10 mg via ORAL
  Filled 2020-11-14 (×4): qty 1

## 2020-11-14 NOTE — Progress Notes (Signed)
   Name: Tyrone [name] MRN: [id number] DOB: [birth year]    ADMISSION DATE:  11/10/2020 CONSULTATION DATE:  11/14/2020 REFERRING MD :  Welford Roche  CHIEF COMPLAINT: Alcohol withdrawal, agitation BRIEF: 75 year old heavy EtOH user, diabetic, hypertensive admitted overnight with 1 month history of dyspnea on exertion, bilateral lower extremity edema.  History is obtained from chart review since patient is unable to provide.  He is only oriented to name currently.  His EtOH level was 39 on admission, BNP was 167 chest x-ray was clear, no hypoxia .  He was given 1 mg of Ativan in the ED and admitted with a presumptive diagnosis of CHF. He required more doses of Ativan since 4 AM and has required 6 mg of Ativan in the last 6 hours.  PCCM consulted for increased agitation  has a past medical history of Anemia, Arthritis, Blood transfusion without reported diagnosis, Depression, Hypertension, Kidney stone, and Multiple duodenal ulcers.   has a past surgical history that includes Lithotripsy; Abdominal surgery; and Colonoscopy.   EVENTS  6/25 admission for dyspnea, suspected CHF 6/26 transferred to ICU for encephalopathy, felt to be EtOH withdrawal. Echo 6/26 normal LV function 6/27 Fell while getting out of bed, had left forehead laceration which was repaired by general surgery.  Later started on precedex 6/28 precedex weaning and starting clonidine taper 6/29 stable for transfer out of ICU   SUBJECTIVE/OVERNIGHT/INTERVAL HX  Off precedex Eating breakfast   VITAL SIGNS: Temp:  [97.5 F (36.4 C)-99 F (37.2 C)] 98.7 F (37.1 C) (06/29 0655) Pulse Rate:  [51-80] 60 (06/29 0600) Resp:  [13-18] 16 (06/29 0600) BP: (98-135)/(51-87) 104/59 (06/29 0930) SpO2:  [94 %-100 %] 94 % (06/29 0600) Weight:  [86.1 kg] 86.1 kg (06/29 0400)  PHYSICAL EXAMINATION: General: ill appearing older adult M reclined in bed NAD  Neuro: AAOx3 following commands.  HEENT: L frontal forehead lac. Anicteric sclera.  Pink mm  Cardiovascular: rrr cap refill brisk  Lungs: even, unlabored. CTA  Abdomen: sound round ndnt  Musculoskeletal: no acute joint deformity no cyanosis or clubbing  Skin: pale c/d/w    ASSESSMENT / PLAN:   Acute toxic metabolic encephalopathy EtOH dependence with withdrawal  - CIWA  -thiamine, folate -etoh cessation counseling -- TOC consult  -clonidine taper  -dc dexmed from MAR   Hyponatremia, mild -volume status, EtOH use - trend BMP. Anticipate improvement with PO intake, hydration.   Depression  - Celexa -EtOH cessation counseling   L frontal forehead lac s/p repair - dissolvable sutures by CCS, no further intervention needed   HTN  - Amlodipine   Anemia  - trend PRN, goal hgb>7    Best Practice (right click and "Reselect all SmartList Selections" daily)   Diet/type: Oral  Pain/Anxiety/Delirium protocol Not indicated VAP protocol (if indicated): Not indicated DVT prophylaxis: LMWH GI prophylaxis: PPI Glucose control:  not indicated Lines: N/A Foley:  N/A Culture data pending:none  Antibiotics:not indicated.  Antibiotic de-escalation: NA Code Status:  full code Last date of multidisciplinary goals of care discussion [n/a] Disposition: Transfer to Progressive 6/29. CCM off 6/30 and will ask TRH to resume care    CCT: n/a   Eliseo Gum MSN, AGACNP-BC [place] for pager  11/14/2020, 9:54 AM

## 2020-11-15 DIAGNOSIS — E8770 Fluid overload, unspecified: Secondary | ICD-10-CM | POA: Diagnosis not present

## 2020-11-15 LAB — RETICULOCYTES
Immature Retic Fract: 33.2 % — ABNORMAL HIGH (ref 2.3–15.9)
RBC.: 3.04 MIL/uL — ABNORMAL LOW (ref 4.22–5.81)
Retic Count, Absolute: 79.6 10*3/uL (ref 19.0–186.0)
Retic Ct Pct: 2.6 % (ref 0.4–3.1)

## 2020-11-15 LAB — GLUCOSE, CAPILLARY
Glucose-Capillary: 123 mg/dL — ABNORMAL HIGH (ref 70–99)
Glucose-Capillary: 151 mg/dL — ABNORMAL HIGH (ref 70–99)
Glucose-Capillary: 97 mg/dL (ref 70–99)
Glucose-Capillary: 120 mg/dL — ABNORMAL HIGH (ref 70–99)

## 2020-11-15 LAB — RPR: RPR Ser Ql: NONREACTIVE

## 2020-11-15 LAB — HIV ANTIBODY (ROUTINE TESTING W REFLEX): HIV Screen 4th Generation wRfx: NONREACTIVE

## 2020-11-15 LAB — FERRITIN: Ferritin: 28 ng/mL (ref 24–336)

## 2020-11-15 LAB — FOLATE: Folate: 27.3 ng/mL (ref >5.9)

## 2020-11-15 LAB — MAGNESIUM: Magnesium: 2.1 mg/dL (ref 1.7–2.4)

## 2020-11-15 LAB — VITAMIN B12: Vitamin B-12: 3700 pg/mL — ABNORMAL HIGH (ref 180–914)

## 2020-11-15 LAB — IRON AND TIBC
Iron: 22 ug/dL — ABNORMAL LOW (ref 45–182)
Saturation Ratios: 6 % — ABNORMAL LOW (ref 17.9–39.5)
TIBC: 381 ug/dL (ref 250–450)
UIBC: 359 ug/dL

## 2020-11-15 MED ORDER — QUETIAPINE FUMARATE 25 MG PO TABS
25.0000 mg | ORAL_TABLET | Freq: Every day | ORAL | Status: DC
  Administered 2020-11-15 – 2020-11-19 (×5): 25 mg via ORAL
  Filled 2020-11-15 (×5): qty 1

## 2020-11-15 MED ORDER — MELATONIN 3 MG PO TABS: 3.0000 mg | ORAL_TABLET | Freq: Once | ORAL | Status: DC

## 2020-11-15 MED ORDER — FENTANYL CITRATE (PF) 100 MCG/2ML IJ SOLN: 25.0000 ug | Freq: Once | INTRAMUSCULAR | Status: DC

## 2020-11-15 MED ORDER — FERROUS SULFATE 325 (65 FE) MG PO TABS
325.0000 mg | ORAL_TABLET | Freq: Every day | ORAL | Status: DC
  Administered 2020-11-15 – 2020-11-20 (×6): 325 mg via ORAL
  Filled 2020-11-15 (×6): qty 1

## 2020-11-15 MED ORDER — OLANZAPINE 10 MG IM SOLR
10.0000 mg | Freq: Once | INTRAMUSCULAR | Status: AC
  Administered 2020-11-15: 10 mg via INTRAMUSCULAR
  Filled 2020-11-15: qty 10

## 2020-11-15 MED ORDER — TRAMADOL HCL 50 MG PO TABS
50.0000 mg | ORAL_TABLET | Freq: Three times a day (TID) | ORAL | Status: DC | PRN
  Administered 2020-11-16: 50 mg via ORAL
  Filled 2020-11-15: qty 1

## 2020-11-15 NOTE — Progress Notes (Signed)
Patient has not slept overnight and has become delirious and confused over night.  Restless in bed and attempting to get out of bed.  Sitter at bedside for safety.  Elink notified and orders received and administered.  Patient continues to be agitated and confused.

## 2020-11-15 NOTE — Progress Notes (Signed)
[facility] Progress Note Patient Name: Tyrone [name] DOB: [birth year] MRN: [id number]   Date of Service  11/15/2020  HPI/Events of Note  Delirium was not resolved by Zyprexa, he may be having some pain, and he is sleep deprived.  eICU Interventions  Fentanyl 25 mcg iv x 1 ordered, Melatonin 3 mg po x 1 ordered.        Kerry Kass [place] Geraci 11/15/2020, 7:06 AM

## 2020-11-15 NOTE — Progress Notes (Signed)
PROGRESS NOTE    Tyrone [name]  GUR:[id number] DOB: 08/22/1945 DOA: 11/10/2020 PCP: Vivi Barrack, MD   Brief Narrative: 75 year old with past medical history significant for heavy alcohol use, diabetes, hypertension who was admitted with shortness of breath, bilateral lower extremity edema 1 month of duration.  Patient presented confuse, oriented to name.  His alcohol level was 39 on admission.  BNP 167, chest x-ray clear.  Patient was admitted with presented with diagnosis of heart failure.  Patient required multiple dose of Ativan for agitation.  Subsequently he was transferred to ICU for Precedex drip under the care of CCM  Event.  6/25 admission for dyspnea, suspected CHF 6/26 transferred to ICU for encephalopathy, felt to be EtOH withdrawal. Echo 6/26 normal LV function 6/27 Fell while getting out of bed, had left forehead laceration which was repaired by general surgery.  Later started on precedex 6/28 precedex weaning and starting clonidine taper 6/29 stable for transfer out of ICU    Care transferred to [facility] on 6/30   Assessment & Plan:   Principal Problem:   Volume overload Active Problems:   Gout   GERD (gastroesophageal reflux disease)   HTN (hypertension)   Chest pain   Encephalopathy acute   Alcohol withdrawal syndrome, with delirium ([place])   1-Acute toxic metabolic encephalopathy, alcohol dependence with withdrawal: -Patient received multiple doses of Ativan and subsequently required Precedex drip. -Wean off of Precedex since 6/28. -On clonidine taper. -Plan to start Seroquel tonight. -Continue with sitter -Continue with thiamine and folic acid. -B 12 elevated, RPR negative. HIV non reactive   Hyponatremia: mild , monitor.    Depression: Continue with Celexa  Left frontal forehead laceration s/p repair Dissolvable sutures by CCS.  No further intervention needed.  Hypertension: Continue with Norvasc  Anemia: anemia panel consistent with iron  deficiency.  Start ferrous sulfate.    Dyspnea; LE edema.  Received lasix.  ECHO limited due to patient lack of cooperation.  BNP 167.      Estimated body mass index is 30.02 kg/m as calculated from the following:   Height as of this encounter: 5\' 9"  (1.753 m).   Weight as of this encounter: 92.2 kg.   DVT prophylaxis: Lovenox Code Status: Full code Family Communication: no family at bedside Disposition Plan:  Status is: Inpatient  Remains inpatient appropriate because:Hemodynamically unstable  Dispo: The patient is from: Home              Anticipated d/c is to:  to be determine              Patient currently is not medically stable to d/c.   Difficult to place patient No        Consultants:  CCM  Procedures:  None  Antimicrobials:    Subjective: He is sleepy. He was able to tell me his name.   Objective: Vitals:   11/15/20 0400 11/15/20 0500 11/15/20 0600 11/15/20 0655  BP: 116/78 113/79 (!) 139/121   Pulse: 75 (!) 58 84   Resp: 15 17 19    Temp:    97.6 F (36.4 C)  TempSrc:    Oral  SpO2: 94% 97% 99%   Weight: 92.2 kg     Height:        Intake/Output Summary (Last 24 hours) at 11/15/2020 0757 Last data filed at 11/15/2020 0730 Gross per 24 hour  Intake 300 ml  Output 1750 ml  Net -1450 ml   Filed Weights   11/13/20 0300  11/14/20 0400 11/15/20 0400  Weight: 86.4 kg 86.1 kg 92.2 kg    Examination:  General exam: Appears calm and comfortable  Respiratory system: Clear to auscultation. Respiratory effort normal. Cardiovascular system: S1 & S2 heard, RRR. No JVD, murmurs, rubs, gallops or clicks. No pedal edema. Gastrointestinal system: Abdomen is nondistended, soft and nontender. No organomegaly or masses felt. Normal bowel sounds heard. Central nervous system: Sleepy, oriented to person.  Extremities: Symmetric 5 x 5 power.   Data Reviewed: I have personally reviewed following labs and imaging studies  CBC: Recent Labs  Lab  11/10/20 1801 11/11/20 2253 11/12/20 0257 11/13/20 0600 11/14/20 0111  WBC 6.5  --   --  8.4 6.1  HGB 10.4* 9.9* 10.2* 7.9* 7.6*  HCT 32.9* 29.0* 30.0* 26.0* 24.5*  MCV 89.4  --   --  92.2 91.8  PLT 293  --   --  198 761   Basic Metabolic Panel: Recent Labs  Lab 11/10/20 1801 11/10/20 2350 11/11/20 2253 11/12/20 0257 11/12/20 0822 11/13/20 0600 11/14/20 0111  NA 134*  --  136 136 136 133* 132*  K 3.7  --  3.8 3.7 3.9 3.4* 3.6  CL 96*  --   --   --  100 95* 99  CO2 23  --   --   --  28 26 28   GLUCOSE 131*  --   --   --  121* 133* 100*  BUN 8  --   --   --  18 14 13   CREATININE 1.46*  --   --   --  2.05* 1.32* 1.24  CALCIUM 9.0  --   --   --  8.1* 8.1* 8.3*  MG  --  2.0  --   --   --  2.1  --   PHOS  --  3.7  --   --   --  3.5  --    GFR: Estimated Creatinine Clearance: 57.7 mL/min (by C-G formula based on SCr of 1.24 mg/dL). Liver Function Tests: Recent Labs  Lab 11/10/20 1801 11/13/20 0600  AST 27 54*  ALT 24 28  ALKPHOS 88 86  BILITOT 1.1 0.9  PROT 7.4 5.5*  ALBUMIN 4.1 3.1*   No results for input(s): LIPASE, AMYLASE in the last 168 hours. No results for input(s): AMMONIA in the last 168 hours. Coagulation Profile: No results for input(s): INR, PROTIME in the last 168 hours. Cardiac Enzymes: No results for input(s): CKTOTAL, CKMB, CKMBINDEX, TROPONINI in the last 168 hours. BNP (last 3 results) No results for input(s): PROBNP in the last 8760 hours. HbA1C: No results for input(s): HGBA1C in the last 72 hours. CBG: Recent Labs  Lab 11/13/20 2129 11/14/20 0654 11/14/20 1144 11/14/20 1633 11/14/20 2211  GLUCAP 110* 107* 127* 171* 103*   Lipid Profile: No results for input(s): CHOL, HDL, LDLCALC, TRIG, CHOLHDL, LDLDIRECT in the last 72 hours. Thyroid Function Tests: No results for input(s): TSH, T4TOTAL, FREET4, T3FREE, THYROIDAB in the last 72 hours. Anemia Panel: No results for input(s): VITAMINB12, FOLATE, FERRITIN, TIBC, IRON, RETICCTPCT in the  last 72 hours. Sepsis Labs: Recent Labs  Lab 11/13/20 0600  LATICACIDVEN 0.7    Recent Results (from the past 240 hour(s))  SARS CORONAVIRUS 2 (TAT 6-24 HRS) Nasopharyngeal Nasopharyngeal Swab     Status: None   Collection Time: 11/10/20 10:56 PM   Specimen: Nasopharyngeal Swab  Result Value Ref Range Status   SARS Coronavirus 2 NEGATIVE NEGATIVE Final  Comment: (NOTE) SARS-CoV-2 target nucleic acids are NOT DETECTED.  The SARS-CoV-2 RNA is generally detectable in upper and lower respiratory specimens during the acute phase of infection. Negative results do not preclude SARS-CoV-2 infection, do not rule out co-infections with other pathogens, and should not be used as the sole basis for treatment or other patient management decisions. Negative results must be combined with clinical observations, patient history, and epidemiological information. The expected result is Negative.  Fact Sheet for Patients: SugarRoll.be  Fact Sheet for Healthcare Providers: https://www.woods-mathews.com/  This test is not yet approved or cleared by the [place] FDA and  has been authorized for detection and/or diagnosis of SARS-CoV-2 by FDA under an Emergency Use Authorization (EUA). This EUA will remain  in effect (meaning this test can be used) for the duration of the COVID-19 declaration under Se ction 564(b)(1) of the Act, 21 U.S.C. section 360bbb-3(b)(1), unless the authorization is terminated or revoked sooner.  Performed at [facility], [place] [address], [place], [place] [address]   MRSA Next Gen by PCR, Nasal     Status: None   Collection Time: 11/11/20  3:20 PM   Specimen: Nasal Mucosa; Nasal Swab  Result Value Ref Range Status   MRSA by PCR Next Gen NOT DETECTED NOT DETECTED Final    Comment: (NOTE) The GeneXpert MRSA Assay (FDA approved for NASAL specimens only), is one component of a comprehensive MRSA colonization  surveillance program. It is not intended to diagnose MRSA infection nor to guide or monitor treatment for MRSA infections. Test performance is not FDA approved in patients less than 55 years old. Performed at [facility], [place] [address] [place], [place] [address]   MRSA Next Gen by PCR, Nasal     Status: None   Collection Time: 11/13/20  3:29 AM   Specimen: Nasal Mucosa; Nasal Swab  Result Value Ref Range Status   MRSA by PCR Next Gen NOT DETECTED NOT DETECTED Final    Comment: (NOTE) The GeneXpert MRSA Assay (FDA approved for NASAL specimens only), is one component of a comprehensive MRSA colonization surveillance program. It is not intended to diagnose MRSA infection nor to guide or monitor treatment for MRSA infections. Test performance is not FDA approved in patients less than 45 years old. Performed at [facility], [place] [address], [place],  [address]          Radiology Studies: No results found.      Scheduled Meds:  allopurinol  300 mg Oral Daily   [START ON 11/17/2020] amLODipine  10 mg Oral Daily   Chlorhexidine Gluconate Cloth  6 each Topical Daily   citalopram  20 mg Oral Daily   cloNIDine  0.1 mg Oral BID   Followed by   Derrill Memo ON 11/16/2020] cloNIDine  0.2 mg Oral Daily   enoxaparin (LOVENOX) injection  40 mg Subcutaneous Q24H   fentaNYL (SUBLIMAZE) injection  25 mcg Intravenous Once   folic acid  1 mg Oral Daily   insulin aspart  0-5 Units Subcutaneous QHS   insulin aspart  0-9 Units Subcutaneous TID WC   multivitamin with minerals  1 tablet Oral Daily   pantoprazole  40 mg Oral BID   thiamine  100 mg Oral Daily   Or   thiamine  100 mg Intravenous Daily   Continuous Infusions:  lactated ringers Stopped (11/14/20 1000)     LOS: 4 days    Time spent: 35 minutes.  Elmarie Shiley, MD [facility]   If 7PM-7AM, please contact night-coverage [email or web address]  11/15/2020, 7:57 AM

## 2020-11-15 NOTE — Progress Notes (Signed)
[place] Progress Note Patient Name: Tyrone [name] DOB: Jun 24, 1945 MRN: [id number]   Date of Service  11/15/2020  HPI/Events of Note  Ativan for ETOH delirium recently discontinued, patient appears to have ICU delirium at this point  and attempted to get out of bed , putting himself at risk for falling. Qtc 0.492.  eICU Interventions  Zyprexa 10 mg IM x 1 ordered.        Kerry Kass Keaton Stirewalt 11/15/2020, 4:35 AM

## 2020-11-16 ENCOUNTER — Inpatient Hospital Stay (HOSPITAL_COMMUNITY): Payer: Medicare Other

## 2020-11-16 DIAGNOSIS — E8770 Fluid overload, unspecified: Secondary | ICD-10-CM | POA: Diagnosis not present

## 2020-11-16 LAB — BASIC METABOLIC PANEL
Anion gap: 5 (ref 5–15)
BUN: 7 mg/dL — ABNORMAL LOW (ref 8–23)
CO2: 29 mmol/L (ref 22–32)
Calcium: 8.7 mg/dL — ABNORMAL LOW (ref 8.9–10.3)
Chloride: 102 mmol/L (ref 98–111)
Creatinine, Ser: 1.03 mg/dL (ref 0.61–1.24)
GFR, Estimated: >60 mL/min (ref >60)
Glucose, Bld: 116 mg/dL — ABNORMAL HIGH (ref 70–99)
Potassium: 3.8 mmol/L (ref 3.5–5.1)
Sodium: 136 mmol/L (ref 135–145)

## 2020-11-16 LAB — CBC
HCT: 26.8 % — ABNORMAL LOW (ref 39.0–52.0)
Hemoglobin: 8.2 g/dL — ABNORMAL LOW (ref 13.0–17.0)
MCH: 28.2 pg (ref 26.0–34.0)
MCHC: 30.6 g/dL (ref 30.0–36.0)
MCV: 92.1 fL (ref 80.0–100.0)
Platelets: 173 10*3/uL (ref 150–400)
RBC: 2.91 MIL/uL — ABNORMAL LOW (ref 4.22–5.81)
RDW: 16 % — ABNORMAL HIGH (ref 11.5–15.5)
WBC: 6.4 10*3/uL (ref 4.0–10.5)
nRBC: 0 % (ref 0.0–0.2)

## 2020-11-16 LAB — AMMONIA: Ammonia: 13 umol/L (ref 9–35)

## 2020-11-16 LAB — GLUCOSE, CAPILLARY
Glucose-Capillary: 101 mg/dL — ABNORMAL HIGH (ref 70–99)
Glucose-Capillary: 112 mg/dL — ABNORMAL HIGH (ref 70–99)
Glucose-Capillary: 137 mg/dL — ABNORMAL HIGH (ref 70–99)
Glucose-Capillary: 110 mg/dL — ABNORMAL HIGH (ref 70–99)
Glucose-Capillary: 123 mg/dL — ABNORMAL HIGH (ref 70–99)

## 2020-11-16 IMAGING — MR MR HEAD W/O CM
6 of 11 series · 26 of 48 positions shown · non-contrast
Comparison: None.

CLINICAL DATA: Delirium

EXAM:
MRI HEAD WITHOUT CONTRAST
TECHNIQUE: Multiplanar, multiecho pulse sequences of the brain and surrounding
structures were obtained without intravenous contrast.

[Series 2: DWI · axial · 3.0mm · 0.94mm/px · z∈[-76,+82]mm · 8 of 108 slices shown (1 of 2)]
[im 1/108]
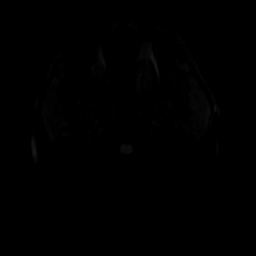
[im 16/108]
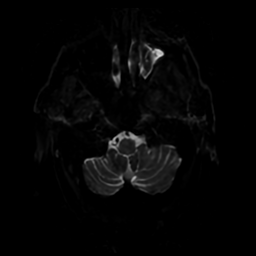
[im 31/108]
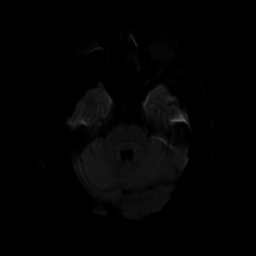
[im 46/108]
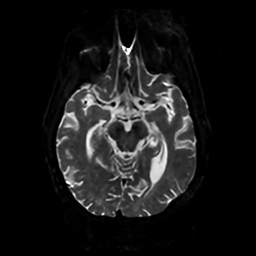
[im 62/108]
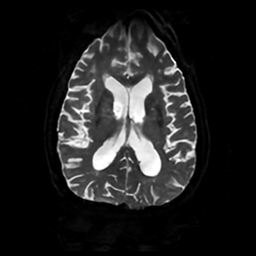
[im 77/108]
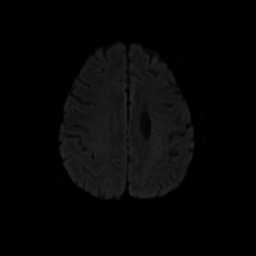
[im 92/108]
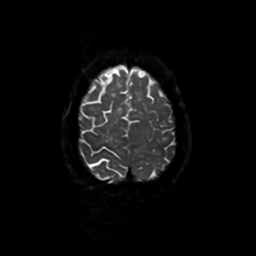
[im 108/108]
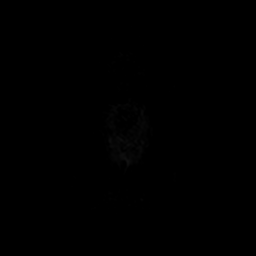

[Series 3: DWI · coronal · 4.0mm · 0.94mm/px · 6 of 76 slices shown (2 of 2)]
[im 1/76]
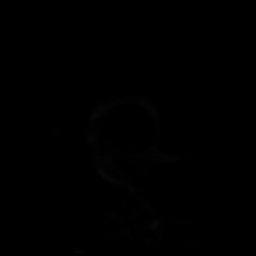
[im 16/76]
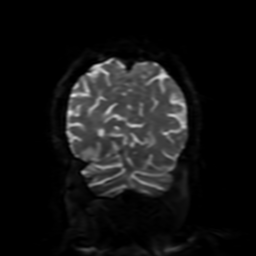
[im 31/76]
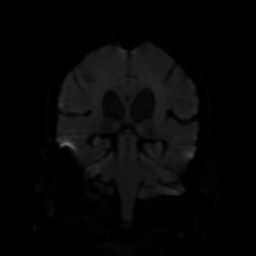
[im 46/76]
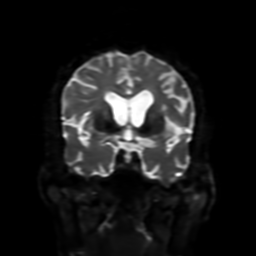
[im 61/76]
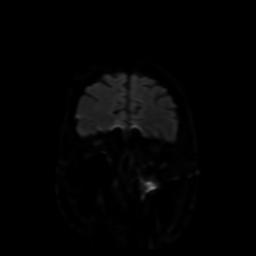
[im 76/76]
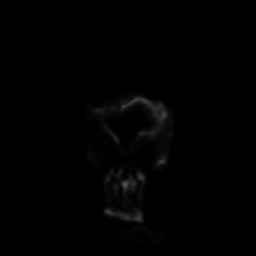

[Series 4: FLAIR · sagittal · 5.0mm · 0.23mm/px · 2 of 25 slices shown (1 of 2)]
[im 1/25]
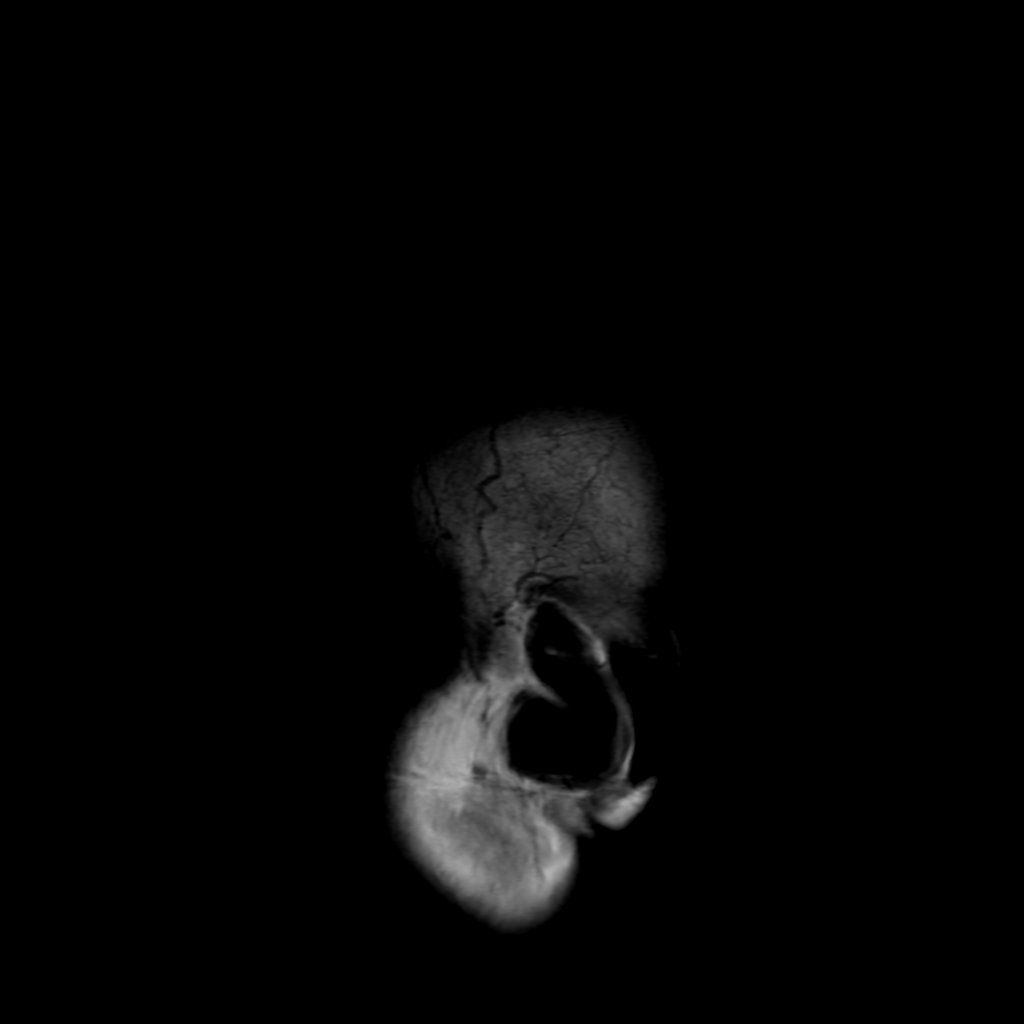
[im 25/25]
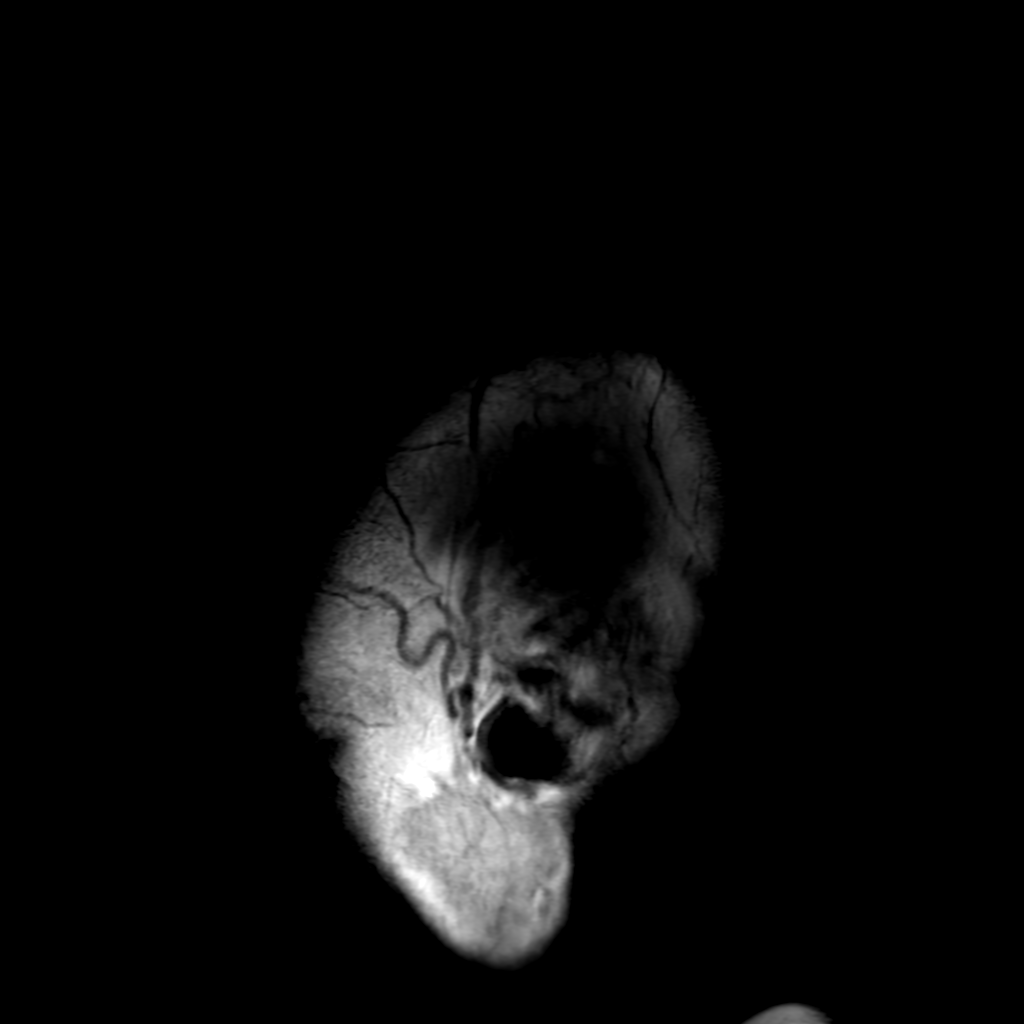

[Series 6: FLAIR · axial · 4.0mm · 0.45mm/px · z∈[-76,+82]mm · 3 of 37 slices shown (2 of 2)]
[im 1/37]
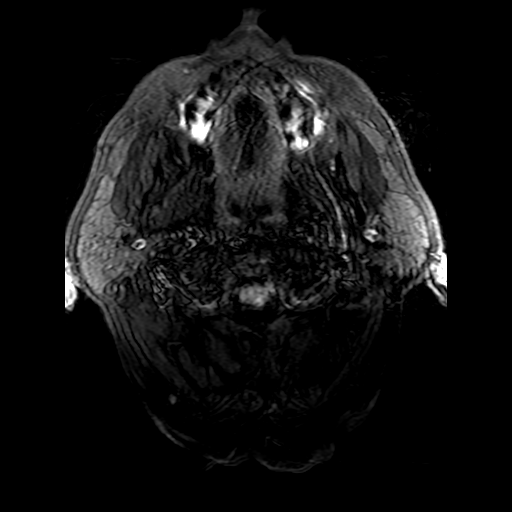
[im 19/37]
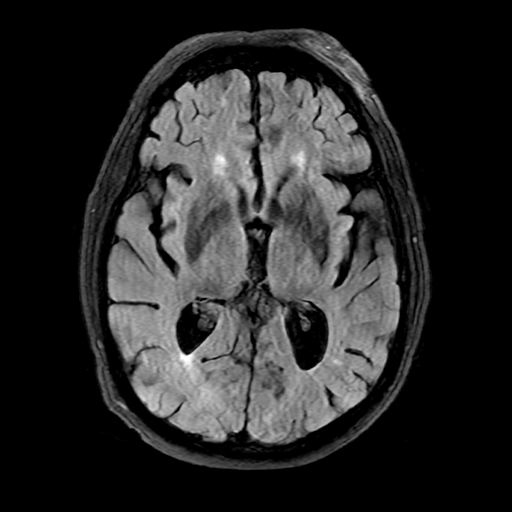
[im 37/37]
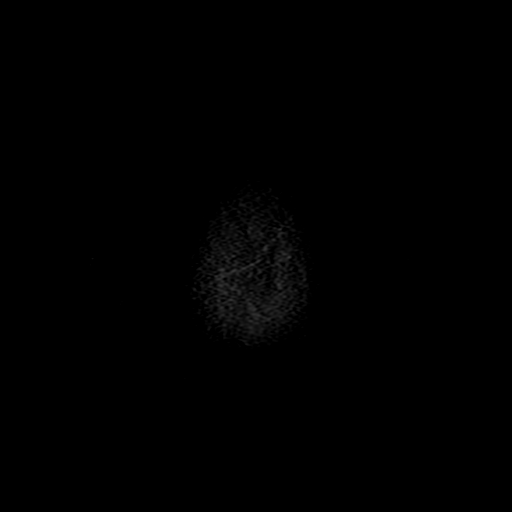

[Series 250: ADC · axial · 3.0mm · 0.94mm/px · z∈[-76,+82]mm · 4 of 54 slices shown (1 of 2)]
[im 1/54]
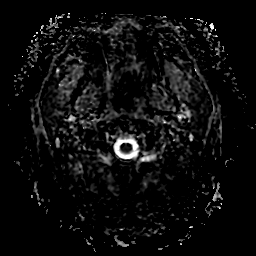
[im 18/54]
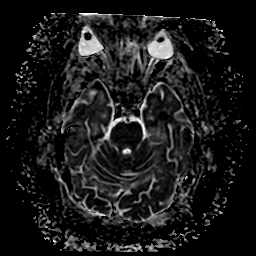
[im 36/54]
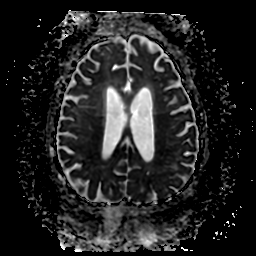
[im 54/54]
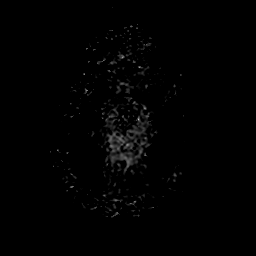

[Series 350: ADC · coronal · 4.0mm · 0.94mm/px · 3 of 38 slices shown (2 of 2)]
[im 1/38]
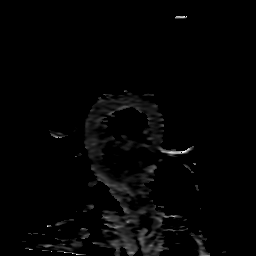
[im 19/38]
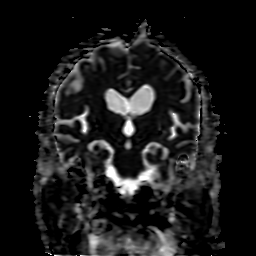
[im 38/38]
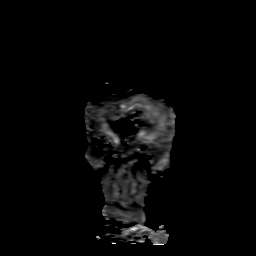

[26 of 48 positions shown; findings below may reference images not displayed]

FINDINGS: Motion artifact is present.

Brain: There is no acute infarction or intracranial hemorrhage.
There is no intracranial mass, mass effect, or edema. There is no
hydrocephalus or extra-axial fluid collection. Prominence of the
ventricles and sulci reflect generalized parenchymal volume loss.
Patchy foci of T2 hyperintensity in the supratentorial white matter
are nonspecific but may reflect mild to moderate chronic
microvascular ischemic changes.

Vascular: Major vessel flow voids at the skull base are preserved.

Skull and upper cervical spine: Normal marrow signal is preserved.

Sinuses/Orbits: Chronic left maxillary sinusitis. Orbits are
unremarkable.

Other: Left frontal scalp soft tissue swelling. Sella is
unremarkable. Mastoid air cells are clear.
IMPRESSION: Motion degraded.

No evidence of recent infarction, hemorrhage, or mass. Chronic
microvascular ischemic changes.

## 2020-11-16 MED ORDER — ENSURE ENLIVE PO LIQD
237.0000 mL | Freq: Two times a day (BID) | ORAL | Status: DC
  Administered 2020-11-17 – 2020-11-20 (×7): 237 mL via ORAL

## 2020-11-16 MED ORDER — MELATONIN 5 MG PO TABS
5.0000 mg | ORAL_TABLET | Freq: Every day | ORAL | Status: DC
  Administered 2020-11-16 – 2020-11-19 (×5): 5 mg via ORAL
  Filled 2020-11-16 (×5): qty 1

## 2020-11-16 NOTE — Therapy (Signed)
Occupational Therapy Evaluation Patient Details Name: Tyrone [name] MRN: [id number] DOB: [birth year] Today's Date: 11/16/2020    History of Present Illness Pt is a 75 y.o. male admitted 11/10/20 with dyspnea, BLE edema; workup for suspected CHF exacerbation. Transfer to ICU 5/26 for encephalopathy, felt to be ETOH withdrawal. Fall while getting OOB 6/27 with L forehead lac. Transfer out of ICU 6/29. PMH includes HTN, anemia, arthritis.   Clinical Impression   Pt presents with deficits in cognition, functional mobility, transfers, decreased safety awareness & awareness of deficits during ADL's. He is currently Min-Mod A functional transfers and LB ADL's. He should benefit from acute OT followed by [facility] and 24/7 supervision/PRN A at discharge. PT spoke with pt wife whom stated plan for home and 24/sitter/caregiver. Will follow acutely to address deficits and assist with increased independence.    Follow Up Recommendations  Home health OT;Supervision/Assistance - 24 hour (PT spoke with spouse whom stated plan to get caregiver/sitter when she is at work during the day.)    Oceanographer    Recommendations for Other Services       Precautions / Restrictions Precautions Precautions: Fall Restrictions Weight Bearing Restrictions: No      Mobility Bed Mobility Overal bed mobility: Needs Assistance Bed Mobility: Supine to Sit     Supine to sit: Min assist;HOB elevated     General bed mobility comments: Decreased awareness of deficits    Transfers Overall transfer level: Needs assistance   Transfers: Sit to/from Stand;Stand Pivot Transfers Sit to Stand: Min guard;From elevated surface Stand pivot transfers: Mod assist;From elevated surface            Balance Overall balance assessment: Needs assistance;History of Falls Sitting-balance support: Bilateral upper extremity supported;Feet supported       Standing balance support: Bilateral  upper extremity supported;During functional activity                               ADL either performed or assessed with clinical judgement   ADL Overall ADL's : Needs assistance/impaired     Grooming: Wash/dry hands;Minimal assistance;Cueing for safety;Cueing for sequencing;Standing Grooming Details (indicate cue type and reason): Pt lethargic, decreased activtiy tolerance. Leaning on elbows at counter while washing hands Upper Body Bathing: Sitting;Set up;Min guard (Pt noted to fatigue sitting at EOB.)   Lower Body Bathing: Moderate assistance;Sit to/from stand;Cueing for safety;Cueing for sequencing   Upper Body Dressing : Min guard;Minimal assistance;Sitting;Cueing for safety;Cueing for sequencing   Lower Body Dressing: Moderate assistance;Sit to/from stand;Cueing for safety;Cueing for sequencing   Toilet Transfer: Minimal assistance;Cueing for safety;Cueing for sequencing;Regular Toilet;Grab bars;RW   Toileting- Clothing Manipulation and Hygiene: Moderate assistance;Cueing for safety;Cueing for sequencing;Sit to/from stand   Tub/ Banker:  (Pt has tub at home, takes showers, TBA)   Functional mobility during ADLs: Minimal assistance;Cueing for safety;Cueing for sequencing;Rolling walker General ADL Comments:  (Pt presents with deconditioning, fatigues easily, decreased activity tolerance overall.)     Vision Baseline Vision/History:  (Wears contacts, has glasses) Vision Assessment?:  (NT)     Perception     Praxis      Pertinent Vitals/Pain Pain Assessment: Faces (Facial grimmace) Pain Score: 2  Faces Pain Scale: Hurts little more Pain Location: chest Pain Descriptors / Indicators: Sore Pain Intervention(s): Monitored during session;Limited activity within patient's tolerance     Hand Dominance Left   Extremity/Trunk Assessment Upper Extremity Assessment Upper Extremity Assessment: Generalized weakness (  Bilateral UE strength impaired, grossly  3/5 for biceps, triceps, grip. Overall deconditioned)   Lower Extremity Assessment Lower Extremity Assessment: Defer to PT evaluation;Generalized weakness       Communication Communication Communication: No difficulties   Cognition Arousal/Alertness: Awake/alert Behavior During Therapy: WFL for tasks assessed/performed Overall Cognitive Status: No family/caregiver present to determine baseline cognitive functioning Area of Impairment: Orientation;Attention;Following commands;Safety/judgement;Awareness;Problem solving                   Current Attention Level: Selective   Following Commands: Follows one step commands consistently Safety/Judgement: Decreased awareness of deficits Awareness: Emergent Problem Solving: Slow processing;Requires verbal cues General Comments: Able to state hospital, but unsure which one. Some awareness of hospital events ("I had a fall downstairs")   General Comments  Sutures above left eye from fall 11/12/20 (When getting OOB)    Exercises     Shoulder Instructions      Home Living Family/patient expects to be discharged to:: Private residence Living Arrangements: Spouse/significant other Available Help at Discharge: Family;Available PRN/intermittently Type of Home: House Home Access: Stairs to enter [employer] of Steps: 6-8 Entrance Stairs-Rails: Right Home Layout: Two level;Able to live on main level with bedroom/bathroom   Alternate Level Stairs-Rails: Right Bathroom Shower/Tub: Tub/shower unit;Walk-in shower   Bathroom Toilet: Handicapped height Bathroom Accessibility: Yes   Home Equipment: Walker - 2 wheels;Grab bars - toilet   Additional Comments: Wife works; reports family will look into hiring 24/7 support/sitters for d/c      Prior Functioning/Environment Level of Independence: Independent        Comments: Pt reports independent and working, enjoys yard work (mows 8 acres). Wife clarifies that pt does not  work daily, is active with yard work and independent at home        OT Problem List: Decreased strength;Decreased activity tolerance;Impaired balance (sitting and/or standing);Decreased cognition;Decreased safety awareness;Decreased knowledge of use of DME or AE;Decreased knowledge of precautions      OT Treatment/Interventions: Self-care/ADL training;DME and/or AE instruction;Therapeutic activities;Cognitive remediation/compensation;Patient/family education    OT Goals(Current goals can be found in the care plan section) Acute Rehab OT Goals Patient Stated Goal:  (Go home) OT Goal Formulation: Patient unable to participate in goal setting Time For Goal Achievement: 11/30/20 Potential to Achieve Goals: Good ADL Goals Pt Will Perform Grooming: sitting;with set-up;with modified independence Pt Will Perform Lower Body Bathing: with min guard assist;sit to/from stand;with adaptive equipment Pt Will Perform Lower Body Dressing: with min guard assist;sit to/from stand;sitting/lateral leans;with adaptive equipment Pt Will Transfer to Toilet: with supervision;ambulating;grab bars Pt Will Perform Toileting - Clothing Manipulation and hygiene: with supervision;with min guard assist;sit to/from stand Pt Will Perform Tub/Shower Transfer: Tub transfer;with min guard assist;with caregiver independent in assisting;ambulating;tub bench;3 in 1;rolling walker  OT Frequency: Min 2X/week   Barriers to D/C:            Co-evaluation PT/OT/SLP Co-Evaluation/Treatment: Yes Reason for Co-Treatment: For patient/therapist safety;Necessary to address cognition/behavior during functional activity          AM-PAC OT "6 Clicks" Daily Activity     Outcome Measure Help from another person eating meals?: A Little Help from another person taking care of personal grooming?: A Little Help from another person toileting, which includes using toliet, bedpan, or urinal?: A Lot Help from another person bathing  (including washing, rinsing, drying)?: A Lot Help from another person to put on and taking off regular upper body clothing?: A Little Help from another person to put on  and taking off regular lower body clothing?: A Little 6 Click Score: 16   End of Session Equipment Utilized During Treatment: Gait belt;Rolling walker Nurse Communication: Mobility status  Activity Tolerance: Patient limited by fatigue;Patient limited by lethargy;Patient limited by pain Patient left: Other (comment) (With PT)  OT Visit Diagnosis: Unsteadiness on feet (R26.81);Muscle weakness (generalized) (M62.81);History of falling (Z91.81);Other symptoms and signs involving cognitive function                Time: 8811-0315 OT Time Calculation (min): 18 min Charges:  OT General Charges $OT Visit: 1 Visit OT Evaluation $OT Eval Low Complexity: 1 Low Jaila Schellhorn Beth Dixon, OTR/L 11/16/2020, 9:32 AM

## 2020-11-16 NOTE — Progress Notes (Signed)
PROGRESS NOTE    Tyrone [name]  CHY:[id number] DOB: 1945-05-25 DOA: 11/10/2020 PCP: Vivi Barrack, MD   Brief Narrative: 75 year old with past medical history significant for heavy alcohol use, diabetes, hypertension who was admitted with shortness of breath, bilateral lower extremity edema 1 month of duration.  Patient presented confuse, oriented to name.  His alcohol level was 39 on admission.  BNP 167, chest x-ray clear.  Patient was admitted with presented with diagnosis of heart failure.  Patient required multiple dose of Ativan for agitation.  Subsequently he was transferred to ICU for Precedex drip under the care of CCM  Event.  6/25 admission for dyspnea, suspected CHF 6/26 transferred to ICU for encephalopathy, felt to be EtOH withdrawal. Echo 6/26 normal LV function 6/27 Fell while getting out of bed, had left forehead laceration which was repaired by general surgery.  Later started on precedex 6/28 precedex weaning and starting clonidine taper 6/29 stable for transfer out of ICU    Care transferred to [facility] on 6/30   Assessment & Plan:   Principal Problem:   Volume overload Active Problems:   Gout   GERD (gastroesophageal reflux disease)   HTN (hypertension)   Chest pain   Encephalopathy acute   Alcohol withdrawal syndrome, with delirium ([place])   1-Acute toxic metabolic encephalopathy, alcohol dependence with withdrawal: -Patient received multiple doses of Ativan and subsequently required Precedex drip. -Wean off of Precedex since 6/28. -On clonidine taper. Last dose today.  -Started Seroquel 6/30. -Continue with sitter -Continue with thiamine and folic acid. -B 12 elevated, RPR negative. HIV non reactive  -Will get MRI.   Hyponatremia: mild , monitor.    Depression: Continue with Celexa  Left frontal forehead laceration s/p repair Dissolvable sutures by CCS.  No further intervention needed.  Hypertension: Continue with Norvasc  Anemia: anemia panel  consistent with iron deficiency.  Started ferrous sulfate.    Dyspnea; LE edema.  Received lasix.  ECHO limited due to patient lack of cooperation.  BNP 167. Resolved.      Estimated body mass index is 30.34 kg/m as calculated from the following:   Height as of this encounter: 5\' 9"  (1.753 m).   Weight as of this encounter: 93.2 kg.   DVT prophylaxis: Lovenox Code Status: Full code Family Communication: no family at bedside. Will call wife Disposition Plan:  Status is: Inpatient  Remains inpatient appropriate because:Hemodynamically unstable  Dispo: The patient is from: Home              Anticipated d/c is to: Home with [facility]              Patient currently is not medically stable to d/c.   Difficult to place patient No        Consultants:  CCM  Procedures:  None  Antimicrobials:    Subjective: He is alert, denies pain, breathing ok. He is alert only to person.   Objective: Vitals:   11/16/20 0446 11/16/20 0900 11/16/20 1025 11/16/20 1100  BP:  (!) 111/56  117/78  Pulse:    70  Resp:  18  15  Temp:   99.9 F (37.7 C) 99.9 F (37.7 C)  TempSrc:   Oral Oral  SpO2:      Weight: 93.2 kg     Height:        Intake/Output Summary (Last 24 hours) at 11/16/2020 1610 Last data filed at 11/16/2020 0823 Gross per 24 hour  Intake 820 ml  Output --  Net 820 ml    Filed Weights   11/15/20 0400 11/15/20 1710 11/16/20 0446  Weight: 92.2 kg 87.9 kg 93.2 kg    Examination:  General exam: NAD Respiratory system: CTA Cardiovascular system: S 1, S 2 RRR Gastrointestinal system: BS present, soft, nt Central nervous system: sleepy, wake up answer few questions.  Extremities: no edema/    Data Reviewed: I have personally reviewed following labs and imaging studies  CBC: Recent Labs  Lab 11/10/20 1801 11/11/20 2253 11/12/20 0257 11/13/20 0600 11/14/20 0111 11/16/20 0334  WBC 6.5  --   --  8.4 6.1 6.4  HGB 10.4* 9.9* 10.2* 7.9* 7.6* 8.2*  HCT 32.9*  29.0* 30.0* 26.0* 24.5* 26.8*  MCV 89.4  --   --  92.2 91.8 92.1  PLT 293  --   --  198 181 573    Basic Metabolic Panel: Recent Labs  Lab 11/10/20 1801 11/10/20 2350 11/11/20 2253 11/12/20 0257 11/12/20 0822 11/13/20 0600 11/14/20 0111 11/15/20 1005 11/16/20 0334  NA 134*  --    < > 136 136 133* 132*  --  136  K 3.7  --    < > 3.7 3.9 3.4* 3.6  --  3.8  CL 96*  --   --   --  100 95* 99  --  102  CO2 23  --   --   --  28 26 28   --  29  GLUCOSE 131*  --   --   --  121* 133* 100*  --  116*  BUN 8  --   --   --  18 14 13   --  7*  CREATININE 1.46*  --   --   --  2.05* 1.32* 1.24  --  1.03  CALCIUM 9.0  --   --   --  8.1* 8.1* 8.3*  --  8.7*  MG  --  2.0  --   --   --  2.1  --  2.1  --   PHOS  --  3.7  --   --   --  3.5  --   --   --    < > = values in this interval not displayed.    GFR: Estimated Creatinine Clearance: 69.9 mL/min (by C-G formula based on SCr of 1.03 mg/dL). Liver Function Tests: Recent Labs  Lab 11/10/20 1801 11/13/20 0600  AST 27 54*  ALT 24 28  ALKPHOS 88 86  BILITOT 1.1 0.9  PROT 7.4 5.5*  ALBUMIN 4.1 3.1*    No results for input(s): LIPASE, AMYLASE in the last 168 hours. Recent Labs  Lab 11/16/20 0334  AMMONIA 13   Coagulation Profile: No results for input(s): INR, PROTIME in the last 168 hours. Cardiac Enzymes: No results for input(s): CKTOTAL, CKMB, CKMBINDEX, TROPONINI in the last 168 hours. BNP (last 3 results) No results for input(s): PROBNP in the last 8760 hours. HbA1C: No results for input(s): HGBA1C in the last 72 hours. CBG: Recent Labs  Lab 11/15/20 1636 11/15/20 1718 11/15/20 2059 11/16/20 0626 11/16/20 1059  GLUCAP 97 151* 120* 112* 137*    Lipid Profile: No results for input(s): CHOL, HDL, LDLCALC, TRIG, CHOLHDL, LDLDIRECT in the last 72 hours. Thyroid Function Tests: No results for input(s): TSH, T4TOTAL, FREET4, T3FREE, THYROIDAB in the last 72 hours. Anemia Panel: Recent Labs    11/15/20 1005  VITAMINB12  3,700*  FOLATE 27.3  FERRITIN 28  TIBC 381  IRON 22*  RETICCTPCT  2.6   Sepsis Labs: Recent Labs  Lab 11/13/20 0600  LATICACIDVEN 0.7     Recent Results (from the past 240 hour(s))  SARS CORONAVIRUS 2 (TAT 6-24 HRS) Nasopharyngeal Nasopharyngeal Swab     Status: None   Collection Time: 11/10/20 10:56 PM   Specimen: Nasopharyngeal Swab  Result Value Ref Range Status   SARS Coronavirus 2 NEGATIVE NEGATIVE Final    Comment: (NOTE) SARS-CoV-2 target nucleic acids are NOT DETECTED.  The SARS-CoV-2 RNA is generally detectable in upper and lower respiratory specimens during the acute phase of infection. Negative results do not preclude SARS-CoV-2 infection, do not rule out co-infections with other pathogens, and should not be used as the sole basis for treatment or other patient management decisions. Negative results must be combined with clinical observations, patient history, and epidemiological information. The expected result is Negative.  Fact Sheet for Patients: SugarRoll.be  Fact Sheet for Healthcare Providers: https://www.woods-mathews.com/  This test is not yet approved or cleared by the [place] FDA and  has been authorized for detection and/or diagnosis of SARS-CoV-2 by FDA under an Emergency Use Authorization (EUA). This EUA will remain  in effect (meaning this test can be used) for the duration of the COVID-19 declaration under Se ction 564(b)(1) of the Act, 21 U.S.C. section 360bbb-3(b)(1), unless the authorization is terminated or revoked sooner.  Performed at [facility], [place] [address] [place], [place] [address]   MRSA Next Gen by PCR, Nasal     Status: None   Collection Time: 11/11/20  3:20 PM   Specimen: Nasal Mucosa; Nasal Swab  Result Value Ref Range Status   MRSA by PCR Next Gen NOT DETECTED NOT DETECTED Final    Comment: (NOTE) The GeneXpert MRSA Assay (FDA approved for NASAL specimens  only), is one component of a comprehensive MRSA colonization surveillance program. It is not intended to diagnose MRSA infection nor to guide or monitor treatment for MRSA infections. Test performance is not FDA approved in patients less than 28 years old. Performed at [facility], [place] [address], [place], [place] [address]   MRSA Next Gen by PCR, Nasal     Status: None   Collection Time: 11/13/20  3:29 AM   Specimen: Nasal Mucosa; Nasal Swab  Result Value Ref Range Status   MRSA by PCR Next Gen NOT DETECTED NOT DETECTED Final    Comment: (NOTE) The GeneXpert MRSA Assay (FDA approved for NASAL specimens only), is one component of a comprehensive MRSA colonization surveillance program. It is not intended to diagnose MRSA infection nor to guide or monitor treatment for MRSA infections. Test performance is not FDA approved in patients less than 61 years old. Performed at [facility], [place] [address], [place], [place] [address]           Radiology Studies: No results found.      Scheduled Meds:  allopurinol  300 mg Oral Daily   [START ON 11/17/2020] amLODipine  10 mg Oral Daily   Chlorhexidine Gluconate Cloth  6 each Topical Daily   citalopram  20 mg Oral Daily   enoxaparin (LOVENOX) injection  40 mg Subcutaneous Q24H   ferrous sulfate  325 mg Oral Q breakfast   folic acid  1 mg Oral Daily   insulin aspart  0-5 Units Subcutaneous QHS   insulin aspart  0-9 Units Subcutaneous TID WC   melatonin  5 mg Oral QHS   multivitamin with minerals  1 tablet Oral  Daily   pantoprazole  40 mg Oral BID   QUEtiapine  25 mg Oral QHS   thiamine  100 mg Oral Daily   Or   thiamine  100 mg Intravenous Daily   Continuous Infusions:     LOS: 5 days    Time spent: 35 minutes.     Elmarie Shiley, MD [facility]   If 7PM-7AM, please contact night-coverage [email or web address]  11/16/2020, 4:10 PM

## 2020-11-16 NOTE — Evaluation (Addendum)
Physical Therapy Evaluation Patient Details Name: Tyrone [name] MRN: [id number] DOB: 1946/03/21 Today's Date: 11/16/2020   History of Present Illness  Pt is a 75 y.o. male admitted 11/10/20 with dyspnea, BLE edema; workup for suspected CHF exacerbation. Transfer to ICU 5/26 for encephalopathy, felt to be ETOH withdrawal. Fall while getting OOB 6/27 with L forehead lac. Transfer out of ICU 6/29. PMH includes HTN, anemia, arthritis.   Clinical Impression  Pt presents with an overall decrease in functional mobility secondary to above. PTA, pt independent without DME, active with yardwork, lives with wife who works during the day. Today, pt requiring up to minA for mobility with RW. Pt limited by generalized weakness, decreased activity tolerance, poor balance strategies and cognitive impairments, including some confusion, slowed processing, and difficulty problem solving. Pt demonstrates improving awareness into deficits; hopeful pt's mobility will progress well as cognition continues to clear. Pt would benefit from continued acute PT services to maximize functional mobility and independence prior to d/c with HHPT services and initial 24/7 supervision for safety. Spoke with wife Vivien Rota) on phone who confirms family interested in hiring caregivers as needed for safety since she works during day (CM notified).     Follow Up Recommendations Home health PT;Supervision/Assistance - 24 hour    Equipment Recommendations  Other (comment) (TBD if pt has RW)    Recommendations for Other Services       Precautions / Restrictions Precautions Precautions: Fall Restrictions Weight Bearing Restrictions: No      Mobility  Bed Mobility Overal bed mobility: Needs Assistance Bed Mobility: Supine to Sit     Supine to sit: Min assist;HOB elevated     General bed mobility comments: Decreased awareness of deficits    Transfers Overall transfer level: Needs assistance Equipment used: None;1 person  hand held assist;Rolling walker (2 wheeled) Transfers: Sit to/from Stand Sit to Stand: Min assist Stand pivot transfers: Mod assist;From elevated surface       General transfer comment: Multiple tries to stand from EOB to RW, reliant on momentum to power into standing and minA for steadying assist, reaching to UE for support; provided RW once standing for balance  Ambulation/Gait Ambulation/Gait assistance: Min guard Gait Distance (Feet): 110 Feet Assistive device: Rolling walker (2 wheeled) Gait Pattern/deviations: Step-through pattern;Decreased stride length;Trunk flexed;Shuffle Gait velocity: Decreased   General Gait Details: Slow, mildly unsteady gait with RW and min guard for balance; frequent cues to maintain closer proximity to RW and upright posture; intermittent cues for sequencing with RW as pt initially picking it up with each step  Stairs            Wheelchair Mobility    Modified Rankin (Stroke Patients Only)       Balance Overall balance assessment: Needs assistance Sitting-balance support: Bilateral upper extremity supported;Feet supported;No upper extremity supported Sitting balance-Leahy Scale: Fair     Standing balance support: Bilateral upper extremity supported;During functional activity;No upper extremity supported Standing balance-Leahy Scale: Poor Standing balance comment: Reliant on UE support to maintain standing balance                             Pertinent Vitals/Pain Pain Assessment: 0-10 Pain Score: 2  Faces Pain Scale: Hurts little more Pain Location: chest Pain Descriptors / Indicators: Sore Pain Intervention(s): Monitored during session    Home Living Family/patient expects to be discharged to:: Private residence Living Arrangements: Spouse/significant other Available Help at Discharge: Family;Available PRN/intermittently Type of  Home: House Home Access: Stairs to enter Entrance Stairs-Rails: Right Entrance  Stairs-Number of Steps: 6-8 Home Layout: Two level;Able to live on main level with bedroom/bathroom Home Equipment: Walker - 2 wheels;Grab bars - toilet Additional Comments: Wife works; reports family will look into hiring 24/7 support/sitters for d/c    Prior Function Level of Independence: Independent         Comments: Pt reports independent and working, enjoys yard work (mows 8 acres). Wife clarifies that pt does not work daily, is active with yard work and independent at home     [place]: Left    Extremity/Trunk Assessment   Upper Extremity Assessment Upper Extremity Assessment: Generalized weakness    Lower Extremity Assessment Lower Extremity Assessment: Generalized weakness    Cervical / Trunk Assessment Cervical / Trunk Assessment: Kyphotic  Communication   Communication: No difficulties  Cognition Arousal/Alertness: Awake/alert Behavior During Therapy: WFL for tasks assessed/performed Overall Cognitive Status: No family/caregiver present to determine baseline cognitive functioning Area of Impairment: Orientation;Attention;Following commands;Safety/judgement;Awareness;Problem solving                   Current Attention Level: Selective   Following Commands: Follows one step commands consistently Safety/Judgement: Decreased awareness of deficits Awareness: Emergent Problem Solving: Slow processing;Requires verbal cues General Comments: Able to state hospital, but unsure which one. Some awareness of hospital events ("I had a fall downstairs")      General Comments General comments (skin integrity, edema, etc.): HR 80s-90s, pt denies dizziness with activity    Exercises     Assessment/Plan    PT Assessment Patient needs continued PT services  PT Problem List Decreased strength;Decreased activity tolerance;Decreased balance;Decreased mobility;Decreased cognition;Decreased knowledge of use of DME;Decreased safety  awareness;Cardiopulmonary status limiting activity;Pain       PT Treatment Interventions DME instruction;Gait training;Stair training;Functional mobility training;Therapeutic activities;Therapeutic exercise;Balance training;Patient/family education;Cognitive remediation    PT Goals (Current goals can be found in the Care Plan section)  Acute Rehab PT Goals Patient Stated Goal: Return home with support PT Goal Formulation: With patient/family Time For Goal Achievement: 11/30/20 Potential to Achieve Goals: Good    Frequency Min 3X/week   Barriers to discharge Decreased caregiver support      Co-evaluation PT/OT/SLP Co-Evaluation/Treatment: Yes Reason for Co-Treatment: Necessary to address cognition/behavior during functional activity;For patient/therapist safety;To address functional/ADL transfers PT goals addressed during session: Mobility/safety with mobility;Balance;Proper use of DME         AM-PAC PT "6 Clicks" Mobility  Outcome Measure Help needed turning from your back to your side while in a flat bed without using bedrails?: A Little Help needed moving from lying on your back to sitting on the side of a flat bed without using bedrails?: A Little Help needed moving to and from a bed to a chair (including a wheelchair)?: A Little Help needed standing up from a chair using your arms (e.g., wheelchair or bedside chair)?: A Little Help needed to walk in hospital room?: A Little Help needed climbing 3-5 steps with a railing? : A Little 6 Click Score: 18    End of Session Equipment Utilized During Treatment: Gait belt Activity Tolerance: Patient tolerated treatment well Patient left: in chair;with call bell/phone within reach;with chair alarm set;with nursing/sitter in room Nurse Communication: Mobility status PT Visit Diagnosis: Other abnormalities of gait and mobility (R26.89);Muscle weakness (generalized) (M62.81)    Time: 1740-8144 PT Time Calculation (min) (ACUTE  ONLY): 34 min   Charges:   PT Evaluation $  PT Eval Moderate Complexity: [place], [place], DPT Acute Rehabilitation Services  Pager [phone] Office [place] 11/16/2020, 10:08 AM

## 2020-11-16 NOTE — Care Management Important Message (Signed)
Important Message  Patient Details  Name: Tyrone [name] MRN: [id number] Date of Birth: 10/19/1945   Medicare Important Message Given:  Yes     Orbie Pyo 11/16/2020, 3:12 PM

## 2020-11-16 NOTE — Plan of Care (Signed)
  Problem: Education: Goal: Ability to demonstrate management of disease process will improve Outcome: Progressing Goal: Ability to verbalize understanding of medication therapies will improve Outcome: Progressing   

## 2020-11-17 DIAGNOSIS — E8770 Fluid overload, unspecified: Secondary | ICD-10-CM | POA: Diagnosis not present

## 2020-11-17 LAB — BASIC METABOLIC PANEL
Anion gap: 8 (ref 5–15)
BUN: 12 mg/dL (ref 8–23)
CO2: 26 mmol/L (ref 22–32)
Calcium: 8.8 mg/dL — ABNORMAL LOW (ref 8.9–10.3)
Chloride: 99 mmol/L (ref 98–111)
Creatinine, Ser: 1.14 mg/dL (ref 0.61–1.24)
GFR, Estimated: >60 mL/min (ref >60)
Glucose, Bld: 109 mg/dL — ABNORMAL HIGH (ref 70–99)
Potassium: 4 mmol/L (ref 3.5–5.1)
Sodium: 133 mmol/L — ABNORMAL LOW (ref 135–145)

## 2020-11-17 LAB — GLUCOSE, CAPILLARY
Glucose-Capillary: 91 mg/dL (ref 70–99)
Glucose-Capillary: 159 mg/dL — ABNORMAL HIGH (ref 70–99)
Glucose-Capillary: 94 mg/dL (ref 70–99)
Glucose-Capillary: 111 mg/dL — ABNORMAL HIGH (ref 70–99)

## 2020-11-17 LAB — CBC
HCT: 26.6 % — ABNORMAL LOW (ref 39.0–52.0)
Hemoglobin: 8.4 g/dL — ABNORMAL LOW (ref 13.0–17.0)
MCH: 29 pg (ref 26.0–34.0)
MCHC: 31.6 g/dL (ref 30.0–36.0)
MCV: 91.7 fL (ref 80.0–100.0)
Platelets: 174 10*3/uL (ref 150–400)
RBC: 2.9 MIL/uL — ABNORMAL LOW (ref 4.22–5.81)
RDW: 16.1 % — ABNORMAL HIGH (ref 11.5–15.5)
WBC: 6.7 10*3/uL (ref 4.0–10.5)
nRBC: 0 % (ref 0.0–0.2)

## 2020-11-17 LAB — MAGNESIUM: Magnesium: 2.1 mg/dL (ref 1.7–2.4)

## 2020-11-17 MED ORDER — POLYETHYLENE GLYCOL 3350 17 G PO PACK
17.0000 g | PACK | Freq: Every day | ORAL | Status: DC
  Administered 2020-11-17 – 2020-11-20 (×4): 17 g via ORAL
  Filled 2020-11-17 (×3): qty 1

## 2020-11-17 NOTE — Progress Notes (Signed)
Physical Therapy Treatment Patient Details Name: Tyrone [name] MRN: [id number] DOB: [birth year] Today's Date: 11/17/2020    History of Present Illness Pt is a 75 y.o. male admitted 11/10/20 with dyspnea, BLE edema; workup for suspected CHF exacerbation. Transfer to ICU 5/26 for encephalopathy, felt to be ETOH withdrawal. Fall while getting OOB 6/27 with L forehead lac. Transfer out of ICU 6/29. PMH includes HTN, anemia, arthritis.    PT Comments    Pt currently not requiring physical assistance to perform bed mobility, transfers, or ambulate. Pt continues to require cues for hand placement during transfers and device management during transfers and with gait. Pt tolerates ambulation of increased distances with RW compared to last session and continues to require reminders for proximity to RW. Pt demonstrates generalized weakness and deficits in balance and cognition. Pt will benefit from continued acute PT services to improve independence and safety with mobility and return to prior level.   Follow Up Recommendations  Home health PT;Supervision/Assistance - 24 hour     Equipment Recommendations  Other (comment) (RW if pt does not currently own one)    Recommendations for Other Services       Precautions / Restrictions Precautions Precautions: Fall Restrictions Weight Bearing Restrictions: No    Mobility  Bed Mobility Overal bed mobility: Needs Assistance Bed Mobility: Supine to Sit     Supine to sit: Min guard;HOB elevated     General bed mobility comments: min G for safety and heavy use of rails    Transfers Overall transfer level: Needs assistance Equipment used: Rolling walker (2 wheeled) Transfers: Sit to/from Stand (3x) Sit to Stand: Min guard         General transfer comment: Pt requires cues for hand placement and device management to perform sit>stand transfer.  Ambulation/Gait Ambulation/Gait assistance: Min guard Gait Distance (Feet): 250 Feet (+ 150 ft  with standing rest break between) Assistive device: Rolling walker (2 wheeled) Gait Pattern/deviations: Step-through pattern;Decreased stride length;Trunk flexed;Decreased step length - left Gait velocity: Decreased Gait velocity interpretation: 1.31 - 2.62 ft/sec, indicative of limited community ambulator General Gait Details: pt continues to require cues for proximity to walker and upright posture during gait   Stairs             Wheelchair Mobility    Modified Rankin (Stroke Patients Only)       Balance Overall balance assessment: Needs assistance Sitting-balance support: Feet supported;Single extremity supported Sitting balance-Leahy Scale: Poor Sitting balance - Comments: pt relying on at least SUE support to maintain balance   Standing balance support: Bilateral upper extremity supported;During functional activity Standing balance-Leahy Scale: Poor Standing balance comment: Reliant on UE support to maintain standing balance                            Cognition Arousal/Alertness: Awake/alert Behavior During Therapy: WFL for tasks assessed/performed Overall Cognitive Status: No family/caregiver present to determine baseline cognitive functioning Area of Impairment: Memory;Safety/judgement                     Memory: Decreased short-term memory (pt unable to recall his room location and number when attempting to return after gait activties.)   Safety/Judgement: Decreased awareness of safety (pt requires cues for hand placement and device management during transfers to improve safety)            Exercises      General Comments General comments (skin integrity, edema, etc.):  VSS on RA      Pertinent Vitals/Pain Pain Assessment: No/denies pain    Home Living                      Prior Function            PT Goals (current goals can now be found in the care plan section) Acute Rehab PT Goals Patient Stated Goal: get better  and go home Progress towards PT goals: Progressing toward goals    Frequency    Min 3X/week      PT Plan Current plan remains appropriate    Co-evaluation              AM-PAC PT "6 Clicks" Mobility   Outcome Measure  Help needed turning from your back to your side while in a flat bed without using bedrails?: A Little Help needed moving from lying on your back to sitting on the side of a flat bed without using bedrails?: A Little Help needed moving to and from a bed to a chair (including a wheelchair)?: A Little Help needed standing up from a chair using your arms (e.g., wheelchair or bedside chair)?: A Little Help needed to walk in hospital room?: A Little Help needed climbing 3-5 steps with a railing? : A Little 6 Click Score: 18    End of Session Equipment Utilized During Treatment: Gait belt Activity Tolerance: Patient tolerated treatment well Patient left: in chair;with call bell/phone within reach;with chair alarm set;with nursing/sitter in room Nurse Communication: Mobility status PT Visit Diagnosis: Other abnormalities of gait and mobility (R26.89);Muscle weakness (generalized) (M62.81)     Time: 3382-5053 PT Time Calculation (min) (ACUTE ONLY): 21 min  Charges:  $Gait Training: 8-22 mins                     Acute Rehab  Pager: [phone]    Garwin Brothers, SPT 11/17/2020, 1:43 PM

## 2020-11-17 NOTE — Progress Notes (Signed)
OT Cancellation Note  Patient Details Name: Tyrone [name] MRN: [id number] DOB: [birth year]   Cancelled Treatment:    Reason Eval/Treat Not Completed: Patient declined, just returned to bed, wanting to rest.  OT will continue efforts as appropriate.    Meliss Fleek D Cameshia Cressman 11/17/2020, 2:26 PM 11/17/2020  [place], OTR/L  Acute Rehabilitation Services  Office:  [phone]

## 2020-11-17 NOTE — NC FL2 (Addendum)
[place] LEVEL OF CARE SCREENING TOOL     IDENTIFICATION  Patient Name: Tyrone [name] Birthdate: [birth year] Sex: male Admission Date (Current Location): 11/10/2020  [facility] and [place] Number:  Herbalist and Address:  [facility] [place], [place] [address] [place], [place] [address]      Provider Number: [id number]  Attending Physician Name and Address:  Elmarie Shiley, MD  Relative Name and Phone Number:  Rasheed, Welty (Spouse)   [phone]    Current Level of Care: Hospital Recommended Level of Care: Central Prior Approval Number:    Date Approved/Denied:   PASRR Number:  [id number]  Discharge Plan: SNF    Current Diagnoses: Patient Active Problem List   Diagnosis Date Noted   Encephalopathy acute    Alcohol withdrawal syndrome, with delirium ([place])    Volume overload 11/11/2020   Chest pain 11/10/2020   Degenerative lumbar spinal stenosis 08/03/2020   Hyperglycemia 04/05/2020   Steatohepatitis 10/04/2019   Subclinical hyperthyroidism 09/14/2019   HTN (hypertension) 07/26/2019   GERD (gastroesophageal reflux disease) 06/06/2019   Chronic left-sided lumbar radiculopathy 01/10/2019   Gout 12/03/2018   Left knee pain 12/03/2018   Senile purpura ([place]) 12/03/2018   B12 deficiency 07/20/2018   Fatigue 07/20/2018   Left foot drop 05/13/2018   Alcohol abuse 05/13/2018    Orientation RESPIRATION BLADDER Height & Weight     Self, Place  Normal Incontinent, External catheter Weight: 195 lb 8 oz (88.7 kg) Height:  5\' 9"  (175.3 cm)  BEHAVIORAL SYMPTOMS/MOOD NEUROLOGICAL BOWEL NUTRITION STATUS      Continent Diet (See DC Summary)  AMBULATORY STATUS COMMUNICATION OF NEEDS Skin   Limited Assist Verbally Normal                       Personal Care Assistance Level of Assistance  Bathing, Dressing, Feeding Bathing Assistance: Limited assistance Feeding assistance: Independent Dressing Assistance:  Limited assistance     Functional Limitations Info  Sight, Hearing, Speech Sight Info: Adequate Hearing Info: Adequate Speech Info: Adequate    SPECIAL CARE FACTORS FREQUENCY  PT (By licensed PT), OT (By licensed OT)     PT Frequency: 5x a week OT Frequency: 5x a week            Contractures Contractures Info: Not present    Additional Factors Info  Code Status, Allergies Code Status Info: Full Allergies Info: NKA           Current Medications (11/17/2020):  This is the current hospital active medication list Current Facility-Administered Medications  Medication Dose Route Frequency Provider Last Rate Last Admin   acetaminophen (TYLENOL) tablet 650 mg  650 mg Oral Q6H PRN Hunsucker, Bonna Gains, MD   650 mg at 11/16/20 2123   Or   acetaminophen (TYLENOL) suppository 650 mg  650 mg Rectal Q6H PRN Hunsucker, Bonna Gains, MD       albuterol (PROVENTIL) (2.5 MG/3ML) 0.083% nebulizer solution 2.5 mg  2.5 mg Nebulization Q3H PRN Hunsucker, Bonna Gains, MD       allopurinol (ZYLOPRIM) tablet 300 mg  300 mg Oral Daily Hunsucker, Bonna Gains, MD   300 mg at 11/17/20 4801   amLODipine (NORVASC) tablet 10 mg  10 mg Oral Daily Candee Furbish, MD   10 mg at 11/17/20 6553   Chlorhexidine Gluconate Cloth 2 % PADS 6 each  6 each Topical Daily Hunsucker, Bonna Gains, MD   6 each at  11/17/20 0825   citalopram (CELEXA) tablet 20 mg  20 mg Oral Daily Hunsucker, Bonna Gains, MD   20 mg at 11/17/20 0823   enoxaparin (LOVENOX) injection 40 mg  40 mg Subcutaneous Q24H Hunsucker, Bonna Gains, MD   40 mg at 11/17/20 7371   feeding supplement (ENSURE ENLIVE / ENSURE PLUS) liquid 237 mL  237 mL Oral BID BM Regalado, Belkys A, MD   237 mL at 11/17/20 0824   ferrous sulfate tablet 325 mg  325 mg Oral Q breakfast Regalado, Belkys A, MD   325 mg at 11/11/92 8546   folic acid (FOLVITE) tablet 1 mg  1 mg Oral Daily Candee Furbish, MD   1 mg at 11/17/20 2703   insulin aspart (novoLOG) injection 0-5 Units  0-5 Units  Subcutaneous QHS Hunsucker, Bonna Gains, MD       insulin aspart (novoLOG) injection 0-9 Units  0-9 Units Subcutaneous TID WC Hunsucker, Bonna Gains, MD   1 Units at 11/16/20 1743   melatonin tablet 5 mg  5 mg Oral QHS Irene Pap N, DO   5 mg at 11/16/20 2115   multivitamin with minerals tablet 1 tablet  1 tablet Oral Daily Hunsucker, Bonna Gains, MD   1 tablet at 11/17/20 0824   pantoprazole (PROTONIX) EC tablet 40 mg  40 mg Oral BID Candee Furbish, MD   40 mg at 11/17/20 0824   polyethylene glycol (MIRALAX / GLYCOLAX) packet 17 g  17 g Oral Daily Regalado, Belkys A, MD   17 g at 11/17/20 0958   QUEtiapine (SEROQUEL) tablet 25 mg  25 mg Oral QHS Regalado, Belkys A, MD   25 mg at 11/16/20 2115   thiamine tablet 100 mg  100 mg Oral Daily Hunsucker, Bonna Gains, MD   100 mg at 11/17/20 5009   Or   thiamine (B-1) injection 100 mg  100 mg Intravenous Daily Hunsucker, Bonna Gains, MD   100 mg at 11/15/20 0915   traMADol (ULTRAM) tablet 50 mg  50 mg Oral Q8H PRN Regalado, Belkys A, MD   50 mg at 11/16/20 0146     Discharge Medications: Please see discharge summary for a list of discharge medications.  Relevant Imaging Results:  Relevant Lab Results:   Additional Information SSN: [id number]; Moderna COVID-19 Vaccine 12/24/2019 , 11/26/2019  Reece Agar, LCSWA

## 2020-11-17 NOTE — TOC Initial Note (Addendum)
Transition of Care [facility]) - Initial/Assessment Note    Patient Details  Name: Tyrone [name] MRN: [id number] Date of Birth: Jun 20, 1945  [facility] CM/SW Contact:    Tresa Endo Phone Number: 11/17/2020, 1:48 PM  Clinical Narrative:                 7903: CSW attempted to contact pt wife, no answer. VM was left, pt is only oriented to person and place.CSW will follow up.  1430: CSW spoke with pt wife Vivien Rota about SNF options, she expressed that she would prefer if the facility could be near [place] or [place] area preferably [place]. CSW faxed pt information out to surrounding facilities based on pt address. CSW will follow up with wife once offers are back.        Patient Goals and CMS Choice        Expected Discharge Plan and Services                                                Prior Living Arrangements/Services                       Activities of Daily Living      Permission Sought/Granted                  Emotional Assessment              Admission diagnosis:  Alcohol abuse [F10.10] Chest pain [R07.9] Chest pain, unspecified type [R07.9] Patient Active Problem List   Diagnosis Date Noted   Encephalopathy acute    Alcohol withdrawal syndrome, with delirium ([place])    Volume overload 11/11/2020   Chest pain 11/10/2020   Degenerative lumbar spinal stenosis 08/03/2020   Hyperglycemia 04/05/2020   Steatohepatitis 10/04/2019   Subclinical hyperthyroidism 09/14/2019   HTN (hypertension) 07/26/2019   GERD (gastroesophageal reflux disease) 06/06/2019   Chronic left-sided lumbar radiculopathy 01/10/2019   Gout 12/03/2018   Left knee pain 12/03/2018   Senile purpura ([place]) 12/03/2018   B12 deficiency 07/20/2018   Fatigue 07/20/2018   Left foot drop 05/13/2018   Alcohol abuse 05/13/2018   PCP:  Vivi Barrack, MD Pharmacy:   [facility] [place], [place] [address] [place]  [address] Phone: [phone] Fax: [phone]     Social Determinants of Health (SDOH) Interventions    Readmission Risk Interventions No flowsheet data found.

## 2020-11-17 NOTE — Progress Notes (Signed)
PROGRESS NOTE    Tyrone [name]  YYT:[id number] DOB: June 27, 1945 DOA: 11/10/2020 PCP: Vivi Barrack, MD   Brief Narrative: 75 year old with past medical history significant for heavy alcohol use, diabetes, hypertension who was admitted with shortness of breath, bilateral lower extremity edema 1 month of duration.  Patient presented confuse, oriented to name.  His alcohol level was 39 on admission.  BNP 167, chest x-ray clear.  Patient was admitted with presented with diagnosis of heart failure.  Patient required multiple dose of Ativan for agitation.  Subsequently he was transferred to ICU for Precedex drip under the care of CCM  Event.  6/25 admission for dyspnea, suspected CHF 6/26 transferred to ICU for encephalopathy, felt to be EtOH withdrawal. Echo 6/26 normal LV function 6/27 Fell while getting out of bed, had left forehead laceration which was repaired by general surgery.  Later started on precedex 6/28 precedex weaning and starting clonidine taper 6/29 stable for transfer out of ICU    Care transferred to [facility] on 6/30   Assessment & Plan:   Principal Problem:   Volume overload Active Problems:   Gout   GERD (gastroesophageal reflux disease)   HTN (hypertension)   Chest pain   Encephalopathy acute   Alcohol withdrawal syndrome, with delirium ([place])   1-Acute toxic metabolic encephalopathy, alcohol dependence with withdrawal: -Patient received multiple doses of Ativan and subsequently required Precedex drip. -Wean off of Precedex since 6/28. -On clonidine taper. Last dose 7/01. -Started Seroquel 6/30. -Continue with thiamine and folic acid. -B 12 elevated, RPR negative. HIV non reactive  - MRI no recent infarct.  -He is oriented to place and situation today. Appears more calm.  -Plan is for rehab.   Hyponatremia: mild , monitor.   Depression: Continue with Celexa.  Left frontal forehead laceration s/p repair Dissolvable sutures by CCS.  No further intervention  needed.  Hypertension: Continue with Norvasc  Anemia: anemia panel consistent with iron deficiency.  Started ferrous sulfate.   Dyspnea; LE edema.  Received lasix.  ECHO limited due to patient lack of cooperation.  BNP 167. Resolved.      Estimated body mass index is 28.87 kg/m as calculated from the following:   Height as of this encounter: 5\' 9"  (1.753 m).   Weight as of this encounter: 88.7 kg.   DVT prophylaxis: Lovenox Code Status: Full code Family Communication: Son at bedside 7/01 Disposition Plan:  Status is: Inpatient  Remains inpatient appropriate because:Hemodynamically unstable  Dispo: The patient is from: Home              Anticipated d/c is to: SNF for rehab.               Patient currently is not medically stable to d/c.   Difficult to place patient No        Consultants:  CCM  Procedures:  None  Antimicrobials:    Subjective: He is feeling well. He ate some breakfast.  He denies dyspnea. He is alert to place and situation today.   Objective: Vitals:   11/16/20 2002 11/16/20 2341 11/17/20 0610 11/17/20 0800  BP: (!) 107/56 (!) 124/45 136/68 138/79  Pulse:    78  Resp: 15 15 16 16   Temp: 98.9 F (37.2 C) 97.9 F (36.6 C) 98.8 F (37.1 C) 99.1 F (37.3 C)  TempSrc: Oral Oral Oral Oral  SpO2:   96% 94%  Weight:   88.7 kg   Height:  Intake/Output Summary (Last 24 hours) at 11/17/2020 1547 Last data filed at 11/17/2020 1300 Gross per 24 hour  Intake 1197 ml  Output 850 ml  Net 347 ml    Filed Weights   11/15/20 1710 11/16/20 0446 11/17/20 0610  Weight: 87.9 kg 93.2 kg 88.7 kg    Examination:  General exam: NAD Respiratory system: CTA Cardiovascular system: S 1, S 2 RRR Gastrointestinal system: BS present, soft,  Central nervous system: Alert, oriented times 3, following command Extremities: no edema/    Data Reviewed: I have personally reviewed following labs and imaging studies  CBC: Recent Labs  Lab  11/10/20 1801 11/11/20 2253 11/12/20 0257 11/13/20 0600 11/14/20 0111 11/16/20 0334 11/17/20 0355  WBC 6.5  --   --  8.4 6.1 6.4 6.7  HGB 10.4*   < > 10.2* 7.9* 7.6* 8.2* 8.4*  HCT 32.9*   < > 30.0* 26.0* 24.5* 26.8* 26.6*  MCV 89.4  --   --  92.2 91.8 92.1 91.7  PLT 293  --   --  198 181 173 174   < > = values in this interval not displayed.    Basic Metabolic Panel: Recent Labs  Lab 11/10/20 2350 11/11/20 2253 11/12/20 0822 11/13/20 0600 11/14/20 0111 11/15/20 1005 11/16/20 0334 11/17/20 0355  NA  --    < > 136 133* 132*  --  136 133*  K  --    < > 3.9 3.4* 3.6  --  3.8 4.0  CL  --   --  100 95* 99  --  102 99  CO2  --   --  28 26 28   --  29 26  GLUCOSE  --   --  121* 133* 100*  --  116* 109*  BUN  --   --  18 14 13   --  7* 12  CREATININE  --   --  2.05* 1.32* 1.24  --  1.03 1.14  CALCIUM  --   --  8.1* 8.1* 8.3*  --  8.7* 8.8*  MG 2.0  --   --  2.1  --  2.1  --  2.1  PHOS 3.7  --   --  3.5  --   --   --   --    < > = values in this interval not displayed.    GFR: Estimated Creatinine Clearance: 61.7 mL/min (by C-G formula based on SCr of 1.14 mg/dL). Liver Function Tests: Recent Labs  Lab 11/10/20 1801 11/13/20 0600  AST 27 54*  ALT 24 28  ALKPHOS 88 86  BILITOT 1.1 0.9  PROT 7.4 5.5*  ALBUMIN 4.1 3.1*    No results for input(s): LIPASE, AMYLASE in the last 168 hours. Recent Labs  Lab 11/16/20 0334  AMMONIA 13    Coagulation Profile: No results for input(s): INR, PROTIME in the last 168 hours. Cardiac Enzymes: No results for input(s): CKTOTAL, CKMB, CKMBINDEX, TROPONINI in the last 168 hours. BNP (last 3 results) No results for input(s): PROBNP in the last 8760 hours. HbA1C: No results for input(s): HGBA1C in the last 72 hours. CBG: Recent Labs  Lab 11/16/20 1059 11/16/20 1540 11/16/20 2114 11/17/20 0612 11/17/20 1148  GLUCAP 137* 110* 123* 91 159*    Lipid Profile: No results for input(s): CHOL, HDL, LDLCALC, TRIG, CHOLHDL,  LDLDIRECT in the last 72 hours. Thyroid Function Tests: No results for input(s): TSH, T4TOTAL, FREET4, T3FREE, THYROIDAB in the last 72 hours. Anemia Panel: Recent Labs  11/15/20 1005  VITAMINB12 3,700*  FOLATE 27.3  FERRITIN 28  TIBC 381  IRON 22*  RETICCTPCT 2.6    Sepsis Labs: Recent Labs  Lab 11/13/20 0600  LATICACIDVEN 0.7     Recent Results (from the past 240 hour(s))  SARS CORONAVIRUS 2 (TAT 6-24 HRS) Nasopharyngeal Nasopharyngeal Swab     Status: None   Collection Time: 11/10/20 10:56 PM   Specimen: Nasopharyngeal Swab  Result Value Ref Range Status   SARS Coronavirus 2 NEGATIVE NEGATIVE Final    Comment: (NOTE) SARS-CoV-2 target nucleic acids are NOT DETECTED.  The SARS-CoV-2 RNA is generally detectable in upper and lower respiratory specimens during the acute phase of infection. Negative results do not preclude SARS-CoV-2 infection, do not rule out co-infections with other pathogens, and should not be used as the sole basis for treatment or other patient management decisions. Negative results must be combined with clinical observations, patient history, and epidemiological information. The expected result is Negative.  Fact Sheet for Patients: SugarRoll.be  Fact Sheet for Healthcare Providers: https://www.woods-mathews.com/  This test is not yet approved or cleared by the [place] FDA and  has been authorized for detection and/or diagnosis of SARS-CoV-2 by FDA under an Emergency Use Authorization (EUA). This EUA will remain  in effect (meaning this test can be used) for the duration of the COVID-19 declaration under Se ction 564(b)(1) of the Act, 21 U.S.C. section 360bbb-3(b)(1), unless the authorization is terminated or revoked sooner.  Performed at [facility], [place] [address], [place], [place] [address]   MRSA Next Gen by PCR, Nasal     Status: None   Collection Time: 11/11/20  3:20 PM    Specimen: Nasal Mucosa; Nasal Swab  Result Value Ref Range Status   MRSA by PCR Next Gen NOT DETECTED NOT DETECTED Final    Comment: (NOTE) The GeneXpert MRSA Assay (FDA approved for NASAL specimens only), is one component of a comprehensive MRSA colonization surveillance program. It is not intended to diagnose MRSA infection nor to guide or monitor treatment for MRSA infections. Test performance is not FDA approved in patients less than 51 years old. Performed at [facility], [place] [address] [place], [place] [address]   MRSA Next Gen by PCR, Nasal     Status: None   Collection Time: 11/13/20  3:29 AM   Specimen: Nasal Mucosa; Nasal Swab  Result Value Ref Range Status   MRSA by PCR Next Gen NOT DETECTED NOT DETECTED Final    Comment: (NOTE) The GeneXpert MRSA Assay (FDA approved for NASAL specimens only), is one component of a comprehensive MRSA colonization surveillance program. It is not intended to diagnose MRSA infection nor to guide or monitor treatment for MRSA infections. Test performance is not FDA approved in patients less than 20 years old. Performed at [facility], [place] [address] [place], [place] [address]           Radiology Studies: MR BRAIN WO CONTRAST  Result Date: 11/16/2020 CLINICAL DATA:  Delirium EXAM: MRI HEAD WITHOUT CONTRAST TECHNIQUE: Multiplanar, multiecho pulse sequences of the brain and surrounding structures were obtained without intravenous contrast. COMPARISON:  None. FINDINGS: Motion artifact is present. Brain: There is no acute infarction or intracranial hemorrhage. There is no intracranial mass, mass effect, or edema. There is no hydrocephalus or extra-axial fluid collection. Prominence of the ventricles and sulci reflect generalized parenchymal volume loss. Patchy foci of T2 hyperintensity in the supratentorial white matter are nonspecific but  may reflect mild to moderate chronic microvascular ischemic changes. Vascular: Major vessel  flow voids at the skull base are preserved. Skull and upper cervical spine: Normal marrow signal is preserved. Sinuses/Orbits: Chronic left maxillary sinusitis. Orbits are unremarkable. Other: Left frontal scalp soft tissue swelling. Sella is unremarkable. Mastoid air cells are clear. IMPRESSION: Motion degraded. No evidence of recent infarction, hemorrhage, or mass. Chronic microvascular ischemic changes. Electronically Signed   By: Macy Mis M.D.   On: 11/16/2020 20:56        Scheduled Meds:  allopurinol  300 mg Oral Daily   amLODipine  10 mg Oral Daily   Chlorhexidine Gluconate Cloth  6 each Topical Daily   citalopram  20 mg Oral Daily   enoxaparin (LOVENOX) injection  40 mg Subcutaneous Q24H   feeding supplement  237 mL Oral BID BM   ferrous sulfate  325 mg Oral Q breakfast   folic acid  1 mg Oral Daily   insulin aspart  0-5 Units Subcutaneous QHS   insulin aspart  0-9 Units Subcutaneous TID WC   melatonin  5 mg Oral QHS   multivitamin with minerals  1 tablet Oral Daily   pantoprazole  40 mg Oral BID   polyethylene glycol  17 g Oral Daily   QUEtiapine  25 mg Oral QHS   thiamine  100 mg Oral Daily   Or   thiamine  100 mg Intravenous Daily   Continuous Infusions:     LOS: 6 days    Time spent: 35 minutes.     Elmarie Shiley, MD [facility]   If 7PM-7AM, please contact night-coverage [email or web address]  11/17/2020, 3:47 PM

## 2020-11-17 NOTE — Social Work (Addendum)
RE: Tyrone [name] Date of Birth: 31-Jul-1945 Date: 11/17/2020  Please be advised that the above-named patient will require a short-term nursing home stay - anticipated 30 days or less for rehabilitation and strengthening.  The plan is for return home.

## 2020-11-18 ENCOUNTER — Inpatient Hospital Stay (HOSPITAL_COMMUNITY): Payer: Medicare Other

## 2020-11-18 DIAGNOSIS — R0609 Other forms of dyspnea: Secondary | ICD-10-CM

## 2020-11-18 DIAGNOSIS — E8770 Fluid overload, unspecified: Secondary | ICD-10-CM | POA: Diagnosis not present

## 2020-11-18 LAB — BASIC METABOLIC PANEL
Anion gap: 6 (ref 5–15)
BUN: 10 mg/dL (ref 8–23)
CO2: 28 mmol/L (ref 22–32)
Calcium: 8.8 mg/dL — ABNORMAL LOW (ref 8.9–10.3)
Chloride: 103 mmol/L (ref 98–111)
Creatinine, Ser: 1.14 mg/dL (ref 0.61–1.24)
GFR, Estimated: >60 mL/min (ref >60)
Glucose, Bld: 107 mg/dL — ABNORMAL HIGH (ref 70–99)
Potassium: 4.4 mmol/L (ref 3.5–5.1)
Sodium: 137 mmol/L (ref 135–145)

## 2020-11-18 LAB — ECHOCARDIOGRAM COMPLETE
AR max vel: 3.14 cm2
AV Area VTI: 3.29 cm2
AV Area mean vel: 3.2 cm2
AV Mean grad: 5 mmHg
AV Peak grad: 9 mmHg
Ao pk vel: 1.5 m/s
Area-P 1/2: 2.99 cm2
Height: 69 in
S' Lateral: 3.5 cm
Weight: 3058.22 oz

## 2020-11-18 LAB — GLUCOSE, CAPILLARY
Glucose-Capillary: 100 mg/dL — ABNORMAL HIGH (ref 70–99)
Glucose-Capillary: 133 mg/dL — ABNORMAL HIGH (ref 70–99)
Glucose-Capillary: 216 mg/dL — ABNORMAL HIGH (ref 70–99)
Glucose-Capillary: 146 mg/dL — ABNORMAL HIGH (ref 70–99)
Glucose-Capillary: 105 mg/dL — ABNORMAL HIGH (ref 70–99)

## 2020-11-18 MED ORDER — PERFLUTREN LIPID MICROSPHERE
1.0000 mL | INTRAVENOUS | Status: AC | PRN
  Administered 2020-11-18: 6 mL via INTRAVENOUS
  Filled 2020-11-18: qty 10

## 2020-11-18 NOTE — Plan of Care (Signed)
  Problem: Education: Goal: Ability to demonstrate management of disease process will improve Outcome: Progressing   Problem: Education: Goal: Ability to verbalize understanding of medication therapies will improve Outcome: Progressing   Problem: Activity: Goal: Capacity to carry out activities will improve Outcome: Progressing   Problem: Cardiac: Goal: Ability to achieve and maintain adequate cardiopulmonary perfusion will improve Outcome: Progressing   

## 2020-11-18 NOTE — Progress Notes (Signed)
PROGRESS NOTE    Tyrone [name]  WGN:[id number] DOB: [birth year] DOA: 11/10/2020 PCP: Vivi Barrack, MD   Brief Narrative: 75 year old with past medical history significant for heavy alcohol use, diabetes, hypertension who was admitted with shortness of breath, bilateral lower extremity edema 1 month of duration.  Patient presented confuse, oriented to name.  His alcohol level was 39 on admission.  BNP 167, chest x-ray clear.  Patient was admitted with presented with diagnosis of heart failure.  Patient required multiple dose of Ativan for agitation.  Subsequently he was transferred to ICU for Precedex drip under the care of CCM  Event.  6/25 admission for dyspnea, suspected CHF 6/26 transferred to ICU for encephalopathy, felt to be EtOH withdrawal. Echo 6/26 normal LV function 6/27 Fell while getting out of bed, had left forehead laceration which was repaired by general surgery.  Later started on precedex 6/28 precedex weaning and starting clonidine taper 6/29 stable for transfer out of ICU    Care transferred to [facility] on 6/30   Assessment & Plan:   Principal Problem:   Volume overload Active Problems:   Gout   GERD (gastroesophageal reflux disease)   HTN (hypertension)   Chest pain   Encephalopathy acute   Alcohol withdrawal syndrome, with delirium ([place])   1-Acute toxic metabolic encephalopathy, alcohol dependence with withdrawal: -Patient received multiple doses of Ativan and subsequently required Precedex drip. -Wean off of Precedex since 6/28. -On clonidine taper. Last dose 7/01. -Started Seroquel 6/30. -Continue with thiamine and folic acid. -B 12 elevated, RPR negative. HIV non reactive  - MRI no recent infarct.  -He is oriented to place and situation. Appears more calm. Improving.  -Plan is for rehab.   Hyponatremia: mild , monitor.   Depression: Continue with Celexa.  Left frontal forehead laceration s/p repair Dissolvable sutures by CCS.  No further  intervention needed.  Hypertension: Continue with Norvasc  Anemia: anemia panel consistent with iron deficiency.  Started ferrous sulfate.   Dyspnea; LE edema.  Received lasix.  ECHO limited due to patient lack of cooperation.  BNP 167. Resolved.  Plan to repeat ECHO to evaluate for diastolic dysfunction.     Estimated body mass index is 28.23 kg/m as calculated from the following:   Height as of this encounter: 5\' 9"  (1.753 m).   Weight as of this encounter: 86.7 kg.   DVT prophylaxis: Lovenox Code Status: Full code Family Communication: Son at bedside 7/01 Disposition Plan:  Status is: Inpatient  Remains inpatient appropriate because:Hemodynamically unstable  Dispo: The patient is from: Home              Anticipated d/c is to: SNF for rehab.               Patient currently is not medically stable to d/c.   Difficult to place patient No        Consultants:  CCM  Procedures:  None  Antimicrobials:    Subjective: He is breathing well, feeling better.  Calm   Objective: Vitals:   11/18/20 0116 11/18/20 0416 11/18/20 0824 11/18/20 1120  BP:  (!) 99/49 119/60 111/61  Pulse:  66 88 77  Resp:  18 19 15   Temp: 98.5 F (36.9 C) 99.1 F (37.3 C) 98.9 F (37.2 C) 99 F (37.2 C)  TempSrc: Oral Oral Oral Oral  SpO2:  92% 95% 97%  Weight:  86.7 kg    Height:        Intake/Output Summary (Last 24  hours) at 11/18/2020 1425 Last data filed at 11/18/2020 0900 Gross per 24 hour  Intake 560 ml  Output 1476 ml  Net -916 ml    Filed Weights   11/16/20 0446 11/17/20 0610 11/18/20 0416  Weight: 93.2 kg 88.7 kg 86.7 kg    Examination:  General exam: NAD Respiratory system: CTA Cardiovascular system: S 1, S 2 RRR Gastrointestinal system: BS present, soft, nt Central nervous system: Alert, oriented. Follow command Extremities: No edema   Data Reviewed: I have personally reviewed following labs and imaging studies  CBC: Recent Labs  Lab  11/12/20 0257 11/13/20 0600 11/14/20 0111 11/16/20 0334 11/17/20 0355  WBC  --  8.4 6.1 6.4 6.7  HGB 10.2* 7.9* 7.6* 8.2* 8.4*  HCT 30.0* 26.0* 24.5* 26.8* 26.6*  MCV  --  92.2 91.8 92.1 91.7  PLT  --  198 181 173 867    Basic Metabolic Panel: Recent Labs  Lab 11/13/20 0600 11/14/20 0111 11/15/20 1005 11/16/20 0334 11/17/20 0355 11/18/20 0421  NA 133* 132*  --  136 133* 137  K 3.4* 3.6  --  3.8 4.0 4.4  CL 95* 99  --  102 99 103  CO2 26 28  --  29 26 28   GLUCOSE 133* 100*  --  116* 109* 107*  BUN 14 13  --  7* 12 10  CREATININE 1.32* 1.24  --  1.03 1.14 1.14  CALCIUM 8.1* 8.3*  --  8.7* 8.8* 8.8*  MG 2.1  --  2.1  --  2.1  --   PHOS 3.5  --   --   --   --   --     GFR: Estimated Creatinine Clearance: 61.1 mL/min (by C-G formula based on SCr of 1.14 mg/dL). Liver Function Tests: Recent Labs  Lab 11/13/20 0600  AST 54*  ALT 28  ALKPHOS 86  BILITOT 0.9  PROT 5.5*  ALBUMIN 3.1*    No results for input(s): LIPASE, AMYLASE in the last 168 hours. Recent Labs  Lab 11/16/20 0334  AMMONIA 13    Coagulation Profile: No results for input(s): INR, PROTIME in the last 168 hours. Cardiac Enzymes: No results for input(s): CKTOTAL, CKMB, CKMBINDEX, TROPONINI in the last 168 hours. BNP (last 3 results) No results for input(s): PROBNP in the last 8760 hours. HbA1C: No results for input(s): HGBA1C in the last 72 hours. CBG: Recent Labs  Lab 11/17/20 1655 11/17/20 2121 11/18/20 0539 11/18/20 1144 11/18/20 1217  GLUCAP 94 111* 100* 216* 133*    Lipid Profile: No results for input(s): CHOL, HDL, LDLCALC, TRIG, CHOLHDL, LDLDIRECT in the last 72 hours. Thyroid Function Tests: No results for input(s): TSH, T4TOTAL, FREET4, T3FREE, THYROIDAB in the last 72 hours. Anemia Panel: No results for input(s): VITAMINB12, FOLATE, FERRITIN, TIBC, IRON, RETICCTPCT in the last 72 hours.  Sepsis Labs: Recent Labs  Lab 11/13/20 0600  LATICACIDVEN 0.7     Recent Results  (from the past 240 hour(s))  SARS CORONAVIRUS 2 (TAT 6-24 HRS) Nasopharyngeal Nasopharyngeal Swab     Status: None   Collection Time: 11/10/20 10:56 PM   Specimen: Nasopharyngeal Swab  Result Value Ref Range Status   SARS Coronavirus 2 NEGATIVE NEGATIVE Final    Comment: (NOTE) SARS-CoV-2 target nucleic acids are NOT DETECTED.  The SARS-CoV-2 RNA is generally detectable in upper and lower respiratory specimens during the acute phase of infection. Negative results do not preclude SARS-CoV-2 infection, do not rule out co-infections with other pathogens, and should  not be used as the sole basis for treatment or other patient management decisions. Negative results must be combined with clinical observations, patient history, and epidemiological information. The expected result is Negative.  Fact Sheet for Patients: SugarRoll.be  Fact Sheet for Healthcare Providers: https://www.woods-mathews.com/  This test is not yet approved or cleared by the [place] FDA and  has been authorized for detection and/or diagnosis of SARS-CoV-2 by FDA under an Emergency Use Authorization (EUA). This EUA will remain  in effect (meaning this test can be used) for the duration of the COVID-19 declaration under Se ction 564(b)(1) of the Act, 21 U.S.C. section 360bbb-3(b)(1), unless the authorization is terminated or revoked sooner.  Performed at [facility], [place] [address], [place], [place] [address]   MRSA Next Gen by PCR, Nasal     Status: None   Collection Time: 11/11/20  3:20 PM   Specimen: Nasal Mucosa; Nasal Swab  Result Value Ref Range Status   MRSA by PCR Next Gen NOT DETECTED NOT DETECTED Final    Comment: (NOTE) The GeneXpert MRSA Assay (FDA approved for NASAL specimens only), is one component of a comprehensive MRSA colonization surveillance program. It is not intended to diagnose MRSA infection nor to guide or monitor treatment for MRSA  infections. Test performance is not FDA approved in patients less than 36 years old. Performed at [facility], [place] [address] [place], [place] [address]   MRSA Next Gen by PCR, Nasal     Status: None   Collection Time: 11/13/20  3:29 AM   Specimen: Nasal Mucosa; Nasal Swab  Result Value Ref Range Status   MRSA by PCR Next Gen NOT DETECTED NOT DETECTED Final    Comment: (NOTE) The GeneXpert MRSA Assay (FDA approved for NASAL specimens only), is one component of a comprehensive MRSA colonization surveillance program. It is not intended to diagnose MRSA infection nor to guide or monitor treatment for MRSA infections. Test performance is not FDA approved in patients less than 58 years old. Performed at [facility], [place] [address], [place], [place] [address]           Radiology Studies: MR BRAIN WO CONTRAST  Result Date: 11/16/2020 CLINICAL DATA:  Delirium EXAM: MRI HEAD WITHOUT CONTRAST TECHNIQUE: Multiplanar, multiecho pulse sequences of the brain and surrounding structures were obtained without intravenous contrast. COMPARISON:  None. FINDINGS: Motion artifact is present. Brain: There is no acute infarction or intracranial hemorrhage. There is no intracranial mass, mass effect, or edema. There is no hydrocephalus or extra-axial fluid collection. Prominence of the ventricles and sulci reflect generalized parenchymal volume loss. Patchy foci of T2 hyperintensity in the supratentorial white matter are nonspecific but may reflect mild to moderate chronic microvascular ischemic changes. Vascular: Major vessel flow voids at the skull base are preserved. Skull and upper cervical spine: Normal marrow signal is preserved. Sinuses/Orbits: Chronic left maxillary sinusitis. Orbits are unremarkable. Other: Left frontal scalp soft tissue swelling. Sella is unremarkable. Mastoid air cells are clear. IMPRESSION: Motion degraded. No evidence of recent infarction, hemorrhage, or mass. Chronic  microvascular ischemic changes. Electronically Signed   By: Macy Mis M.D.   On: 11/16/2020 20:56        Scheduled Meds:  allopurinol  300 mg Oral Daily   amLODipine  10 mg Oral Daily   Chlorhexidine Gluconate Cloth  6 each Topical Daily   citalopram  20 mg Oral Daily   enoxaparin (LOVENOX) injection  40 mg Subcutaneous Q24H  feeding supplement  237 mL Oral BID BM   ferrous sulfate  325 mg Oral Q breakfast   folic acid  1 mg Oral Daily   insulin aspart  0-5 Units Subcutaneous QHS   insulin aspart  0-9 Units Subcutaneous TID WC   melatonin  5 mg Oral QHS   multivitamin with minerals  1 tablet Oral Daily   pantoprazole  40 mg Oral BID   polyethylene glycol  17 g Oral Daily   QUEtiapine  25 mg Oral QHS   thiamine  100 mg Oral Daily   Or   thiamine  100 mg Intravenous Daily   Continuous Infusions:     LOS: 7 days    Time spent: 35 minutes.     Elmarie Shiley, MD [facility]   If 7PM-7AM, please contact night-coverage [email or web address]  11/18/2020, 2:25 PM

## 2020-11-18 NOTE — Plan of Care (Signed)
  Problem: Pain Managment: Goal: General experience of comfort will improve Outcome: Completed/Met   Problem: Skin Integrity: Goal: Risk for impaired skin integrity will decrease Outcome: Completed/Met

## 2020-11-18 NOTE — Progress Notes (Signed)
  Echocardiogram 2D Echocardiogram has been performed.  Tyrone [name]  Lynnette Caffey 11/18/2020, 2:29 PM

## 2020-11-19 DIAGNOSIS — E8770 Fluid overload, unspecified: Secondary | ICD-10-CM | POA: Diagnosis not present

## 2020-11-19 LAB — GLUCOSE, CAPILLARY
Glucose-Capillary: 116 mg/dL — ABNORMAL HIGH (ref 70–99)
Glucose-Capillary: 130 mg/dL — ABNORMAL HIGH (ref 70–99)
Glucose-Capillary: 128 mg/dL — ABNORMAL HIGH (ref 70–99)
Glucose-Capillary: 85 mg/dL (ref 70–99)
Glucose-Capillary: 103 mg/dL — ABNORMAL HIGH (ref 70–99)

## 2020-11-19 LAB — CBC
HCT: 28.9 % — ABNORMAL LOW (ref 39.0–52.0)
Hemoglobin: 8.9 g/dL — ABNORMAL LOW (ref 13.0–17.0)
MCH: 28.5 pg (ref 26.0–34.0)
MCHC: 30.8 g/dL (ref 30.0–36.0)
MCV: 92.6 fL (ref 80.0–100.0)
Platelets: 223 10*3/uL (ref 150–400)
RBC: 3.12 MIL/uL — ABNORMAL LOW (ref 4.22–5.81)
RDW: 16.3 % — ABNORMAL HIGH (ref 11.5–15.5)
WBC: 4.5 10*3/uL (ref 4.0–10.5)
nRBC: 0 % (ref 0.0–0.2)

## 2020-11-19 LAB — BASIC METABOLIC PANEL
Anion gap: 6 (ref 5–15)
BUN: 10 mg/dL (ref 8–23)
CO2: 29 mmol/L (ref 22–32)
Calcium: 8.8 mg/dL — ABNORMAL LOW (ref 8.9–10.3)
Chloride: 102 mmol/L (ref 98–111)
Creatinine, Ser: 1.06 mg/dL (ref 0.61–1.24)
GFR, Estimated: >60 mL/min (ref >60)
Glucose, Bld: 112 mg/dL — ABNORMAL HIGH (ref 70–99)
Potassium: 4.2 mmol/L (ref 3.5–5.1)
Sodium: 137 mmol/L (ref 135–145)

## 2020-11-19 NOTE — Plan of Care (Signed)
  Problem: Nutrition: Goal: Adequate nutrition will be maintained Outcome: Completed/Met   Problem: Activity: Goal: Risk for activity intolerance will decrease Outcome: Completed/Met

## 2020-11-19 NOTE — TOC Initial Note (Addendum)
Transition of Care [facility]) - Initial/Assessment Note    Patient Details  Name: Tyrone [name] MRN: [id number] Date of Birth: 1945/10/14  Transition of Care [facility]) CM/SW Contact:    Bartholomew Crews, RN Phone [phone] 11/19/2020, 2:26 PM  Clinical Narrative:                  Spoke with patient at the bedside. Patient sitting up in chair stated he is feeling better. PTA home with wife but stated that son lives next door. Previously independent with ADLs. Discussed physical therapy needs. Patient stated that he was up today and did well. Noted mobility note that patient ambulated 500' today. Discussed PT/OT recommendations for [facility] Patient agreeable stating that he does not want to go to a rehab facility. Offered choice of home health agency - referral accepted by [facility] for PT and OT. Patient will need HH orders with [facility]. Patient has recommended DME [RW] at home. Stated that his wife or son will pick him when medically cleared for discharge. TOC following for transition needs.   UPDATE: Returned to patient's room to discuss accepting home health agency. Medicare ratings shared with patient.   Discussed talking with family. Patient stated that his wife is at work, but his son, Theresia Lo, may be on his way here. Left voicemail on Seth's phone with CM contact information.   Discussed use of alcohol and his experience with DTs this admission. Patient verbalized that he shouldn't drink. Discussed resources including AA and PCP.   Expected Discharge Plan: [place] Barriers to Discharge: Continued Medical Work up   Patient Goals and CMS Choice Patient states their goals for this hospitalization and ongoing recovery are:: return home with wife CMS Medicare.gov Compare Post Acute Care list provided to:: Patient Choice offered to / list presented to : Patient  Expected Discharge Plan and Services Expected Discharge Plan: [place] In-house Referral:  NA Discharge Planning Services: CM Consult Post Acute Care Choice: [place] arrangements for the past 2 months: Single Family Home                 DME Arranged: N/A DME Agency: NA       HH Arranged: PT, OT HH Agency: [facility] Date HH Agency Contacted: 11/19/20 Time HH Agency Contacted: 29 Representative spoke with at [place]: [place] Arrangements/Services Living arrangements for the past 2 months: Max with:: Self, Spouse Patient language and need for interpreter reviewed:: Yes Do you feel safe going back to the place where you live?: Yes      Need for Family Participation in Patient Care: Yes (Comment) Care giver support system in place?: Yes (comment) Current home services: DME (RW) Criminal Activity/Legal Involvement Pertinent to Current Situation/Hospitalization: No - Comment as needed  Activities of Daily Living      Permission Sought/Granted                  Emotional Assessment Appearance:: Appears stated age Attitude/Demeanor/Rapport: Engaged Affect (typically observed): Accepting Orientation: : Oriented to Self, Oriented to  Time, Oriented to Place, Oriented to Situation Alcohol / Substance Use: Alcohol Use Psych Involvement: No (comment)  Admission diagnosis:  Alcohol abuse [F10.10] Chest pain [R07.9] Chest pain, unspecified type [R07.9] Patient Active Problem List   Diagnosis Date Noted   Encephalopathy acute    Alcohol withdrawal syndrome, with delirium ([place])    Volume overload 11/11/2020  Chest pain 11/10/2020   Degenerative lumbar spinal stenosis 08/03/2020   Hyperglycemia 04/05/2020   Steatohepatitis 10/04/2019   Subclinical hyperthyroidism 09/14/2019   HTN (hypertension) 07/26/2019   GERD (gastroesophageal reflux disease) 06/06/2019   Chronic left-sided lumbar radiculopathy 01/10/2019   Gout 12/03/2018   Left knee pain 12/03/2018   Senile purpura ([place]) 12/03/2018   B12 deficiency  07/20/2018   Fatigue 07/20/2018   Left foot drop 05/13/2018   Alcohol abuse 05/13/2018   PCP:  Vivi Barrack, MD Pharmacy:   [facility], [place] [address] [place] [address] Phone: [phone] Fax: [phone]     Social Determinants of Health (SDOH) Interventions    Readmission Risk Interventions No flowsheet data found.

## 2020-11-19 NOTE — TOC CAGE-AID Note (Signed)
Transition of Care [facility]) - CAGE-AID Screening   Patient Details  Name: Tyrone [name] MRN: [id number] Date of Birth: 08-Dec-1945  Transition of Care [facility] CM/SW Contact:    Bartholomew Crews, RN Phone Number: [phone] 11/19/2020, 3:23 PM   Clinical Narrative:  Good discussion with patient this afternoon. Discussed recent hospital experience with DTs and related fall. Patient verbalized understanding of the need to avoid alcohol. Encouraged to seek out support and discussed resources.   CAGE-AID Screening: Substance Abuse Screening unable to be completed due to: : Patient Refused  Have You Ever Felt You Ought to Cut Down on Your Drinking or Drug Use?: Yes Have People Annoyed You By Critizing Your Drinking Or Drug Use?: No Have You Felt Bad Or Guilty About Your Drinking Or Drug Use?: No Have You Ever Had a Drink or Used Drugs First Thing In The Morning to Steady Your Nerves or to Get Rid of a Hangover?: No CAGE-AID Score: 1  Substance Abuse Education Offered: Yes  Substance abuse interventions: Patient Counseling

## 2020-11-19 NOTE — Progress Notes (Signed)
PROGRESS NOTE    Tyrone [name]  JXB:[id number] DOB: 1945/09/30 DOA: 11/10/2020 PCP: Vivi Barrack, MD   Brief Narrative: 75 year old with past medical history significant for heavy alcohol use, diabetes, hypertension who was admitted with shortness of breath, bilateral lower extremity edema 1 month of duration.  Patient presented confuse, oriented to name.  His alcohol level was 39 on admission.  BNP 167, chest x-ray clear.  Patient was admitted with presented with diagnosis of heart failure.  Patient required multiple dose of Ativan for agitation.  Subsequently he was transferred to ICU for Precedex drip under the care of CCM  Event.  6/25 admission for dyspnea, suspected CHF 6/26 transferred to ICU for encephalopathy, felt to be EtOH withdrawal. Echo 6/26 normal LV function 6/27 Fell while getting out of bed, had left forehead laceration which was repaired by general surgery.  Later started on precedex 6/28 precedex weaning and starting clonidine taper 6/29 stable for transfer out of ICU    Care transferred to [facility] on 6/30   Assessment & Plan:   Principal Problem:   Volume overload Active Problems:   Gout   GERD (gastroesophageal reflux disease)   HTN (hypertension)   Chest pain   Encephalopathy acute   Alcohol withdrawal syndrome, with delirium ([place])   1-Acute toxic metabolic encephalopathy, alcohol dependence with withdrawal: -Patient received multiple doses of Ativan and subsequently required Precedex drip. -Wean off of Precedex since 6/28. -On clonidine taper. Last dose 7/01. -Started Seroquel 6/30. -Continue with thiamine and folic acid. -B 12 elevated, RPR negative. HIV non reactive  - MRI no recent infarct.  -He is oriented to place and situation. Appears more calm. Improving.  -Plan is for rehab. Awaiting SNF  Hyponatremia: mild , monitor.   Depression: Continue with Celexa.  Left frontal forehead laceration s/p repair Dissolvable sutures by CCS.  No  further intervention needed.  Hypertension: Continue with Norvasc  Anemia: anemia panel consistent with iron deficiency.  Started ferrous sulfate.   Acute Diastolic HF ; presented Dyspnea; LE edema.  Received lasix.  ECHO limited due to patient lack of cooperation.  BNP 167. Resolved.  ECHO showed Diastolic Dysfunction grade one.  Euvolemic, not on lasix currently.   Estimated body mass index is 28.49 kg/m as calculated from the following:   Height as of this encounter: 5\' 9"  (1.753 m).   Weight as of this encounter: 87.5 kg.   DVT prophylaxis: Lovenox Code Status: Full code Family Communication: Son at bedside 7/01 Disposition Plan:  Status is: Inpatient  Remains inpatient appropriate because:Hemodynamically unstable  Dispo: The patient is from: Home              Anticipated d/c is to: SNF for rehab.               Patient currently is not medically stable to d/c.   Difficult to place patient No        Consultants:  CCM  Procedures:  None  Antimicrobials:    Subjective: No new complaint, denies dyspnea.    Objective: Vitals:   11/19/20 0500 11/19/20 0750 11/19/20 0950 11/19/20 1200  BP: 120/68 (!) 114/59 120/60 113/65  Pulse: 83 96    Resp:  19  16  Temp: 98.4 F (36.9 C) 98.8 F (37.1 C)  98.8 F (37.1 C)  TempSrc: Oral Oral  Oral  SpO2: 98% 98%  98%  Weight:      Height:        Intake/Output Summary (Last  24 hours) at 11/19/2020 1350 Last data filed at 11/19/2020 1100 Gross per 24 hour  Intake 560 ml  Output 1650 ml  Net -1090 ml    Filed Weights   11/17/20 0610 11/18/20 0416 11/19/20 0033  Weight: 88.7 kg 86.7 kg 87.5 kg    Examination:  General exam: NAD Respiratory system: CTA Cardiovascular system: S 1, S 2 RRR Gastrointestinal system: BS present, soft, nt Central nervous system: Alert Extremities: No edema   Data Reviewed: I have personally reviewed following labs and imaging studies  CBC: Recent Labs  Lab  11/13/20 0600 11/14/20 0111 11/16/20 0334 11/17/20 0355 11/19/20 0755  WBC 8.4 6.1 6.4 6.7 4.5  HGB 7.9* 7.6* 8.2* 8.4* 8.9*  HCT 26.0* 24.5* 26.8* 26.6* 28.9*  MCV 92.2 91.8 92.1 91.7 92.6  PLT 198 181 173 174 191    Basic Metabolic Panel: Recent Labs  Lab 11/13/20 0600 11/14/20 0111 11/15/20 1005 11/16/20 0334 11/17/20 0355 11/18/20 0421 11/19/20 0755  NA 133* 132*  --  136 133* 137 137  K 3.4* 3.6  --  3.8 4.0 4.4 4.2  CL 95* 99  --  102 99 103 102  CO2 26 28  --  29 26 28 29   GLUCOSE 133* 100*  --  116* 109* 107* 112*  BUN 14 13  --  7* 12 10 10   CREATININE 1.32* 1.24  --  1.03 1.14 1.14 1.06  CALCIUM 8.1* 8.3*  --  8.7* 8.8* 8.8* 8.8*  MG 2.1  --  2.1  --  2.1  --   --   PHOS 3.5  --   --   --   --   --   --     GFR: Estimated Creatinine Clearance: 65.9 mL/min (by C-G formula based on SCr of 1.06 mg/dL). Liver Function Tests: Recent Labs  Lab 11/13/20 0600  AST 54*  ALT 28  ALKPHOS 86  BILITOT 0.9  PROT 5.5*  ALBUMIN 3.1*    No results for input(s): LIPASE, AMYLASE in the last 168 hours. Recent Labs  Lab 11/16/20 0334  AMMONIA 13    Coagulation Profile: No results for input(s): INR, PROTIME in the last 168 hours. Cardiac Enzymes: No results for input(s): CKTOTAL, CKMB, CKMBINDEX, TROPONINI in the last 168 hours. BNP (last 3 results) No results for input(s): PROBNP in the last 8760 hours. HbA1C: No results for input(s): HGBA1C in the last 72 hours. CBG: Recent Labs  Lab 11/18/20 1646 11/18/20 2052 11/19/20 0540 11/19/20 1002 11/19/20 1131  GLUCAP 146* 105* 116* 130* 128*    Lipid Profile: No results for input(s): CHOL, HDL, LDLCALC, TRIG, CHOLHDL, LDLDIRECT in the last 72 hours. Thyroid Function Tests: No results for input(s): TSH, T4TOTAL, FREET4, T3FREE, THYROIDAB in the last 72 hours. Anemia Panel: No results for input(s): VITAMINB12, FOLATE, FERRITIN, TIBC, IRON, RETICCTPCT in the last 72 hours.  Sepsis Labs: Recent Labs  Lab  11/13/20 0600  LATICACIDVEN 0.7     Recent Results (from the past 240 hour(s))  SARS CORONAVIRUS 2 (TAT 6-24 HRS) Nasopharyngeal Nasopharyngeal Swab     Status: None   Collection Time: 11/10/20 10:56 PM   Specimen: Nasopharyngeal Swab  Result Value Ref Range Status   SARS Coronavirus 2 NEGATIVE NEGATIVE Final    Comment: (NOTE) SARS-CoV-2 target nucleic acids are NOT DETECTED.  The SARS-CoV-2 RNA is generally detectable in upper and lower respiratory specimens during the acute phase of infection. Negative results do not preclude SARS-CoV-2 infection, do not  rule out co-infections with other pathogens, and should not be used as the sole basis for treatment or other patient management decisions. Negative results must be combined with clinical observations, patient history, and epidemiological information. The expected result is Negative.  Fact Sheet for Patients: SugarRoll.be  Fact Sheet for Healthcare Providers: https://www.woods-mathews.com/  This test is not yet approved or cleared by the [place] FDA and  has been authorized for detection and/or diagnosis of SARS-CoV-2 by FDA under an Emergency Use Authorization (EUA). This EUA will remain  in effect (meaning this test can be used) for the duration of the COVID-19 declaration under Se ction 564(b)(1) of the Act, 21 U.S.C. section 360bbb-3(b)(1), unless the authorization is terminated or revoked sooner.  Performed at [facility], [place] [address] [place], [place] [address]   MRSA Next Gen by PCR, Nasal     Status: None   Collection Time: 11/11/20  3:20 PM   Specimen: Nasal Mucosa; Nasal Swab  Result Value Ref Range Status   MRSA by PCR Next Gen NOT DETECTED NOT DETECTED Final    Comment: (NOTE) The GeneXpert MRSA Assay (FDA approved for NASAL specimens only), is one component of a comprehensive MRSA colonization surveillance program. It is not intended to diagnose  MRSA infection nor to guide or monitor treatment for MRSA infections. Test performance is not FDA approved in patients less than 74 years old. Performed at [facility], [place] [address], [place], [place] [address]   MRSA Next Gen by PCR, Nasal     Status: None   Collection Time: 11/13/20  3:29 AM   Specimen: Nasal Mucosa; Nasal Swab  Result Value Ref Range Status   MRSA by PCR Next Gen NOT DETECTED NOT DETECTED Final    Comment: (NOTE) The GeneXpert MRSA Assay (FDA approved for NASAL specimens only), is one component of a comprehensive MRSA colonization surveillance program. It is not intended to diagnose MRSA infection nor to guide or monitor treatment for MRSA infections. Test performance is not FDA approved in patients less than 11 years old. Performed at [facility], [place] [address] [place],  [address]           Radiology Studies: ECHOCARDIOGRAM COMPLETE  Result Date: 11/18/2020    ECHOCARDIOGRAM REPORT   Patient Name:   Tyrone [name] Date of Exam: 11/18/2020 Medical Rec #:  [id number]         Height:       69.0 in Accession #:    [id number]        Weight:       191.1 lb Date of Birth:  Jul 27, 1945         BSA:          2.027 m Patient Age:    14 years          BP:           111/61 mmHg Patient Gender: M                 HR:           77 bpm. Exam Location:  Inpatient Procedure: 2D Echo, Cardiac Doppler and Color Doppler Indications:    Dyspnea  History:        Patient has prior history of Echocardiogram examinations, most                 recent 11/11/2020.  Sonographer:    Cammy Brochure Referring Phys: [phone] Jerald Kief  A Symphany Fleissner IMPRESSIONS  1. Left ventricular ejection fraction, by estimation, is 60 to 65%. The left ventricle has normal function. The left ventricle has no regional wall motion abnormalities. Left ventricular diastolic parameters are consistent with Grade I diastolic dysfunction (impaired relaxation).  2. Right ventricular systolic function is  normal. The right ventricular size is normal.  3. Left atrial size was mildly dilated.  4. The mitral valve is normal in structure. Trivial mitral valve regurgitation. No evidence of mitral stenosis.  5. The aortic valve is tricuspid. Aortic valve regurgitation is not visualized. No aortic stenosis is present.  6. The inferior vena cava is normal in size with greater than 50% respiratory variability, suggesting right atrial pressure of 3 mmHg. FINDINGS  Left Ventricle: Left ventricular ejection fraction, by estimation, is 60 to 65%. The left ventricle has normal function. The left ventricle has no regional wall motion abnormalities. Definity contrast agent was given IV to delineate the left ventricular  endocardial borders. The left ventricular internal cavity size was normal in size. There is no left ventricular hypertrophy. Left ventricular diastolic parameters are consistent with Grade I diastolic dysfunction (impaired relaxation). Right Ventricle: The right ventricular size is normal. Right ventricular systolic function is normal. Left Atrium: Left atrial size was mildly dilated. Right Atrium: Right atrial size was normal in size. Pericardium: There is no evidence of pericardial effusion. Mitral Valve: The mitral valve is normal in structure. Trivial mitral valve regurgitation. No evidence of mitral valve stenosis. Tricuspid Valve: The tricuspid valve is normal in structure. Tricuspid valve regurgitation is not demonstrated. No evidence of tricuspid stenosis. Aortic Valve: The aortic valve is tricuspid. Aortic valve regurgitation is not visualized. No aortic stenosis is present. Aortic valve mean gradient measures 5.0 mmHg. Aortic valve peak gradient measures 9.0 mmHg. Aortic valve area, by VTI measures 3.29 cm. Pulmonic Valve: The pulmonic valve was normal in structure. Pulmonic valve regurgitation is trivial. No evidence of pulmonic stenosis. Aorta: The aortic root is normal in size and structure. Venous: The  inferior vena cava is normal in size with greater than 50% respiratory variability, suggesting right atrial pressure of 3 mmHg. IAS/Shunts: No atrial level shunt detected by color flow Doppler.  LEFT VENTRICLE PLAX 2D LVIDd:         5.10 cm  Diastology LVIDs:         3.50 cm  LV e' medial:    7.94 cm/s LV PW:         1.10 cm  LV E/e' medial:  9.6 LV IVS:        1.00 cm  LV e' lateral:   10.20 cm/s LVOT diam:     2.40 cm  LV E/e' lateral: 7.5 LV SV:         88 LV SV Index:   43 LVOT Area:     4.52 cm  RIGHT VENTRICLE             IVC RV S prime:     12.30 cm/s  IVC diam: 2.20 cm TAPSE (M-mode): 2.5 cm LEFT ATRIUM             Index       RIGHT ATRIUM           Index LA diam:        4.20 cm 2.07 cm/m  RA Area:     12.10 cm LA Vol (A2C):   60.9 ml 30.05 ml/m RA Volume:   26.00 ml  12.83 ml/m LA Vol (  A4C):   76.8 ml 37.90 ml/m LA Biplane Vol: 70.1 ml 34.59 ml/m  AORTIC VALVE AV Area (Vmax):    3.14 cm AV Area (Vmean):   3.20 cm AV Area (VTI):     3.29 cm AV Vmax:           150.00 cm/s AV Vmean:          97.900 cm/s AV VTI:            0.267 m AV Peak Grad:      9.0 mmHg AV Mean Grad:      5.0 mmHg LVOT Vmax:         104.00 cm/s LVOT Vmean:        69.200 cm/s LVOT VTI:          0.194 m LVOT/AV VTI ratio: 0.73  AORTA Ao Root diam: 3.60 cm Ao Asc diam:  3.30 cm MITRAL VALVE MV Area (PHT): 2.99 cm    SHUNTS MV Decel Time: 254 msec    Systemic VTI:  0.19 m MV E velocity: 76.00 cm/s  Systemic Diam: 2.40 cm MV A velocity: 87.50 cm/s MV E/A ratio:  0.87 Kirk Ruths MD Electronically signed by Kirk Ruths MD Signature Date/Time: 11/18/2020/2:45:53 PM    Final         Scheduled Meds:  allopurinol  300 mg Oral Daily   amLODipine  10 mg Oral Daily   Chlorhexidine Gluconate Cloth  6 each Topical Daily   citalopram  20 mg Oral Daily   enoxaparin (LOVENOX) injection  40 mg Subcutaneous Q24H   feeding supplement  237 mL Oral BID BM   ferrous sulfate  325 mg Oral Q breakfast   folic acid  1 mg Oral Daily    insulin aspart  0-5 Units Subcutaneous QHS   insulin aspart  0-9 Units Subcutaneous TID WC   melatonin  5 mg Oral QHS   multivitamin with minerals  1 tablet Oral Daily   pantoprazole  40 mg Oral BID   polyethylene glycol  17 g Oral Daily   QUEtiapine  25 mg Oral QHS   thiamine  100 mg Oral Daily   Or   thiamine  100 mg Intravenous Daily   Continuous Infusions:     LOS: 8 days    Time spent: 35 minutes.     Elmarie Shiley, MD [facility]   If 7PM-7AM, please contact night-coverage [email or web address]  11/19/2020, 1:50 PM

## 2020-11-19 NOTE — Progress Notes (Signed)
Occupational Therapy Treatment Patient Details Name: Tyrone [name] MRN: [id number] DOB: 25-Nov-1945 Today's Date: 11/19/2020    History of present illness Pt is a 75 y.o. male admitted 11/10/20 with dyspnea, BLE edema; workup for suspected CHF exacerbation. Transfer to ICU 5/26 for encephalopathy, felt to be ETOH withdrawal. Fall while getting OOB 6/27 with L forehead lac. Transfer out of ICU 6/29. PMH includes HTN, anemia, arthritis.   OT comments  Pt progressing well towards OT goals with cognition and safety awareness improved during session. Pt reports, "they tell me I should use the walker, so I do" . Pt with improved stability when using RW. Pt overall Setup for ADLs standing at sink, supervision for mobility to/from bathroom using RW. Discussed med mgmt routine with OT suggesting pt trial pill box use especially if discharged with new medications, along with family assist. Reinforced fall prevention strategies with plans to address tub transfers in next session. Continue to recommend HHOT follow-up with intermittent supervision for showering tasks, IADLs and mobility initially.    Follow Up Recommendations  Home health OT;Other (comment) (direct supervision for showering tasks, IADLs and mobility initially at home; assist with med mgmt)    Equipment Recommendations  Tub/shower bench    Recommendations for Other Services      Precautions / Restrictions Precautions Precautions: Fall Restrictions Weight Bearing Restrictions: No       Mobility Bed Mobility               General bed mobility comments: sitting EOB on entry    Transfers Overall transfer level: Needs assistance Equipment used: None;Rolling walker (2 wheeled) Transfers: Sit to/from Stand Sit to Stand: Supervision         General transfer comment: Supervision for sit to stand without AD, mobility without AD at min guard, Supervision with use of RW    Balance Overall balance assessment: Needs  assistance Sitting-balance support: Feet supported;No upper extremity supported Sitting balance-Leahy Scale: Good     Standing balance support: No upper extremity supported;Bilateral upper extremity supported;During functional activity Standing balance-Leahy Scale: Fair Standing balance comment: fair static standing at sink, use of UE support for improved safety with mobility                           ADL either performed or assessed with clinical judgement   ADL Overall ADL's : Needs assistance/impaired     Grooming: Set up;Standing;Oral care;Brushing hair Grooming Details (indicate cue type and reason): no assist needed                 Toilet Transfer: Supervision/safety;Ambulation;RW;Comfort height toilet;Grab bars Toilet Transfer Details (indicate cue type and reason): pt reports having grab bars around toilet at home. assessed without RW to bathroom and with RW on exit of bathroom. improved stability and smooth gait with RW use.         Functional mobility during ADLs: Supervision/safety;Rolling walker General ADL Comments: Pt with improving cognition, safety awareness with RW use and balance. Discussed med mgmt at home - pt does not use pill box but open to suggestion from OT to trial use for safety and to avoid mismgmt of meds     Vision   Vision Assessment?: No apparent visual deficits   Perception     Praxis      Cognition Arousal/Alertness: Awake/alert Behavior During Therapy: WFL for tasks assessed/performed Overall Cognitive Status: Impaired/Different from baseline Area of Impairment: Safety/judgement  Safety/Judgement: Decreased awareness of deficits     General Comments: Able to answer all orientation questions, some decreased awareness of deficits but able to recall need for DME use (reports "they tell me I should use the walker, so I do"). Behavior and cognition overall appears WFL during session. Though  when asked who he lives with, pt reports no one but per chart, is married.        Exercises     Shoulder Instructions       General Comments VSS on RA    Pertinent Vitals/ Pain       Pain Assessment: No/denies pain  Home Living                                          Prior Functioning/Environment              Frequency  Min 2X/week        Progress Toward Goals  OT Goals(current goals can now be found in the care plan section)  Progress towards OT goals: Progressing toward goals  Acute Rehab OT Goals Patient Stated Goal: get better and go home OT Goal Formulation: With patient Time For Goal Achievement: 11/30/20 Potential to Achieve Goals: Good ADL Goals Pt Will Perform Grooming: sitting;with set-up;with modified independence Pt Will Perform Lower Body Bathing: with min guard assist;sit to/from stand;with adaptive equipment Pt Will Perform Lower Body Dressing: with min guard assist;sit to/from stand;sitting/lateral leans;with adaptive equipment Pt Will Transfer to Toilet: with supervision;ambulating;grab bars Pt Will Perform Toileting - Clothing Manipulation and hygiene: with supervision;with min guard assist;sit to/from stand Pt Will Perform Tub/Shower Transfer: Tub transfer;with min guard assist;with caregiver independent in assisting;ambulating;tub bench;3 in 1;rolling walker  Plan Discharge plan remains appropriate;Discharge plan needs to be updated    Co-evaluation                 AM-PAC OT "6 Clicks" Daily Activity     Outcome Measure   Help from another person eating meals?: None Help from another person taking care of personal grooming?: A Little Help from another person toileting, which includes using toliet, bedpan, or urinal?: A Little Help from another person bathing (including washing, rinsing, drying)?: A Little Help from another person to put on and taking off regular upper body clothing?: A Little Help from another  person to put on and taking off regular lower body clothing?: A Little 6 Click Score: 19    End of Session Equipment Utilized During Treatment: Gait belt;Rolling walker  OT Visit Diagnosis: Unsteadiness on feet (R26.81);Muscle weakness (generalized) (M62.81);History of falling (Z91.81);Other symptoms and signs involving cognitive function   Activity Tolerance Patient tolerated treatment well   Patient Left in chair;with call bell/phone within reach;with chair alarm set   Nurse Communication Mobility status;Other (comment) (cognition WFL)        Time: 7858-8502 OT Time Calculation (min): 13 min  Charges: OT General Charges $OT Visit: 1 Visit OT Treatments $Self Care/Home Management : 8-22 mins  Malachy Chamber, OTR/L Acute Rehab Services Office: [phone]    Layla Maw 11/19/2020, 12:12 PM

## 2020-11-19 NOTE — Progress Notes (Signed)
  Mobility Specialist Criteria Algorithm Info.  Mobility Team: HOB elevated:Activity: Ambulated in hall; Dangled on edge of bed Range of motion: Active; All extremities Level of assistance: Independent Assistive device: Front wheel walker Minutes sitting in chair:  Minutes stood: 5 minutes Minutes ambulated: 5 minutes Distance ambulated (ft): 500 ft Mobility response: Tolerated well Bed Position: Semi-fowlers  Patient ambulated in hallway 500 feet, at supervision level with steady gait. Tolerated ambulation well without complaint or incident  11/19/2020 12:01 PM

## 2020-11-19 NOTE — TOC Progression Note (Signed)
Transition of Care [facility]) - Progression Note    Patient Details  Name: JAMILLE YOSHINO MRN: [id number] Date of Birth: [birth year]  [facility] CM/SW Contact  Bartholomew Crews, RN Phone Number: [phone] 11/19/2020, 3:46 PM  Clinical Narrative:     Received call back from patient's son, Theresia Lo. Discussed transition plans for home and progress patient is making with PT and OT. Theresia Lo is agreeable to home with [facility], and will be available to pick patient up when medically ready.   Expected Discharge Plan: [place] Barriers to Discharge: Continued Medical Work up  Expected Discharge Plan and Services Expected Discharge Plan: [place] In-house Referral: NA Discharge Planning Services: CM Consult Post Acute Care Choice: [place] arrangements for the past 2 months: Single Family Home                 DME Arranged: N/A DME Agency: NA       HH Arranged: PT, OT HH Agency: [place] Date HH Agency Contacted: 11/19/20 Time [place]: 78 Representative spoke with at [place]: Amy   Social Determinants of Health ([place]) Interventions    Readmission Risk Interventions No flowsheet data found.

## 2020-11-19 NOTE — Progress Notes (Signed)
Physical Therapy Treatment Patient Details Name: Tyrone [name] MRN: [id number] DOB: 05/28/1945 Today's Date: 11/19/2020    History of Present Illness Pt is a 75 y.o. male admitted 11/10/20 with dyspnea, BLE edema; workup for suspected CHF exacerbation. Transfer to ICU 5/26 for encephalopathy, felt to be ETOH withdrawal. Fall while getting OOB 6/27 with L forehead lac. Transfer out of ICU 6/29. PMH includes HTN, anemia, arthritis.    PT Comments    Pt making good progress with mobility and with improved cognition. From PT standpoint recommend intermittent supervision at home.   Follow Up Recommendations  Home health PT;Supervision - Intermittent     Equipment Recommendations  None recommended by PT    Recommendations for Other Services       Precautions / Restrictions Precautions Precautions: Fall    Mobility  Bed Mobility               General bed mobility comments: Pt up in chair    Transfers Overall transfer level: Needs assistance Equipment used: None;Rolling walker (2 wheeled) Transfers: Sit to/from Stand Sit to Stand: Supervision         General transfer comment: supervision for safety  Ambulation/Gait Ambulation/Gait assistance: Supervision;Min guard Gait Distance (Feet): 450 Feet Assistive device: Rolling walker (2 wheeled) Gait Pattern/deviations: Step-through pattern;Decreased stride length Gait velocity: decr Gait velocity interpretation: 1.31 - 2.62 ft/sec, indicative of limited community ambulator General Gait Details: With walker supervision only for safety. Amb in room without walker with min guard. Slightly unsteady without walker but no overt loss of balance.   Stairs Stairs: Yes Stairs assistance: Min guard Stair Management: One rail Right;Step to pattern;Forwards Number of Stairs: 3 General stair comments: Assist for safety   Wheelchair Mobility    Modified Rankin (Stroke Patients Only)       Balance Overall balance  assessment: Needs assistance Sitting-balance support: Feet supported;No upper extremity supported Sitting balance-Leahy Scale: Good     Standing balance support: No upper extremity supported;Bilateral upper extremity supported;During functional activity Standing balance-Leahy Scale: Fair                              Cognition Arousal/Alertness: Awake/alert Behavior During Therapy: WFL for tasks assessed/performed Overall Cognitive Status: Impaired/Different from baseline Area of Impairment: Safety/judgement                         Safety/Judgement: Decreased awareness of deficits            Exercises      General Comments General comments (skin integrity, edema, etc.): VSS on RA      Pertinent Vitals/Pain Pain Assessment: No/denies pain    Home Living                      Prior Function            PT Goals (current goals can now be found in the care plan section) Acute Rehab PT Goals Patient Stated Goal: get better and go home PT Goal Formulation: With patient Time For Goal Achievement: 11/26/20 Potential to Achieve Goals: Good Progress towards PT goals: Goals met and updated - see care plan    Frequency    Min 3X/week      PT Plan Current plan remains appropriate    Co-evaluation              AM-PAC PT "6 Clicks" Mobility  Outcome Measure  Help needed turning from your back to your side while in a flat bed without using bedrails?: None Help needed moving from lying on your back to sitting on the side of a flat bed without using bedrails?: None Help needed moving to and from a bed to a chair (including a wheelchair)?: A Little Help needed standing up from a chair using your arms (e.g., wheelchair or bedside chair)?: None Help needed to walk in hospital room?: A Little Help needed climbing 3-5 steps with a railing? : A Little 6 Click Score: 21    End of Session Equipment Utilized During Treatment: Gait  belt Activity Tolerance: Patient tolerated treatment well Patient left: in chair;with call bell/phone within reach;with chair alarm set   PT Visit Diagnosis: Other abnormalities of gait and mobility (R26.89);Muscle weakness (generalized) (M62.81)     Time: [id number] PT Time Calculation (min) (ACUTE ONLY): 18 min  Charges:  $Gait Training: 8-22 mins                     [place] Pager [phone] [place] 11/19/2020, 4:18 PM

## 2020-11-20 DIAGNOSIS — E8770 Fluid overload, unspecified: Secondary | ICD-10-CM | POA: Diagnosis not present

## 2020-11-20 LAB — GLUCOSE, CAPILLARY: Glucose-Capillary: 104 mg/dL — ABNORMAL HIGH (ref 70–99)

## 2020-11-20 MED ORDER — QUETIAPINE FUMARATE 25 MG PO TABS: 25.0000 mg | ORAL_TABLET | Freq: Every day | ORAL | 0 refills | Status: DC

## 2020-11-20 MED ORDER — THIAMINE HCL 100 MG PO TABS: 100.0000 mg | ORAL_TABLET | Freq: Every day | ORAL | 0 refills | Status: DC

## 2020-11-20 MED ORDER — FERROUS SULFATE 325 (65 FE) MG PO TABS: 325.0000 mg | ORAL_TABLET | Freq: Every day | ORAL | 3 refills | Status: DC

## 2020-11-20 MED ORDER — FOLIC ACID 1 MG PO TABS: 1.0000 mg | ORAL_TABLET | Freq: Every day | ORAL | 0 refills | Status: DC

## 2020-11-20 NOTE — Discharge Summary (Signed)
Physician Discharge Summary  Tyrone [name] ZYS:[id number] DOB: 1945-07-29 DOA: 11/10/2020  PCP: Vivi Barrack, MD  Admit date: 11/10/2020 Discharge date: 11/20/2020  Admitted From: Home  Disposition:  Home   Recommendations for Outpatient Follow-up:  Follow up with PCP in 1-2 weeks Please obtain BMP/CBC in one week Continue counseling about alcohol cessation.  Needs further evaluation for iron deficiency anemia.   Home Health: yes, PT, OT  Discharge Condition: Stable.  CODE STATUS:Full Code Diet recommendation: Heart Healthy   Brief/Interim Summary: 75 year old with past medical history significant for heavy alcohol use, diabetes, hypertension who was admitted with shortness of breath, bilateral lower extremity edema 1 month of duration.  Patient presented confuse, oriented to name.  His alcohol level was 39 on admission.  BNP 167, chest x-ray clear.  Patient was admitted with presented with diagnosis of heart failure.  Patient required multiple dose of Ativan for agitation.  Subsequently he was transferred to ICU for Precedex drip under the care of CCM   Event. 6/25 admission for dyspnea, suspected CHF 6/26 transferred to ICU for encephalopathy, felt to be EtOH withdrawal. Echo 6/26 normal LV function 6/27 Fell while getting out of bed, had left forehead laceration which was repaired by general surgery.  Later started on precedex 6/28 precedex weaning and starting clonidine taper 6/29 stable for transfer out of ICU    Care transferred to [facility] on 6/30.    1-Acute toxic metabolic encephalopathy, alcohol dependence with withdrawal: -Patient received multiple doses of Ativan and subsequently required Precedex drip. -Wean off of Precedex since 6/28. -On clonidine taper. Last dose 7/01. -Started Seroquel 6/30. -Continue with thiamine and folic acid. -B 12 elevated, RPR negative. HIV non reactive - MRI no recent infarct. -Patient mental status has improved, he has been reading in  his room. He alert and conversant. He will be discharge home with [facility]    Hyponatremia: mild , monitor.   Depression: Continue with Celexa.   Left frontal forehead laceration s/p repair Dissolvable sutures by CCS.  No further intervention needed.   Hypertension: Continue with Norvasc   Anemia: anemia panel consistent with iron deficiency. Started ferrous sulfate.    Acute Diastolic HF ; presented Dyspnea; LE edema. Received lasix. BNP 167. Resolved.  ECHO showed Diastolic Dysfunction grade one. Euvolemic, not on lasix currently. resume home dose PRN lasix.        Discharge Diagnoses:  Principal Problem:   Volume overload Active Problems:   Gout   GERD (gastroesophageal reflux disease)   HTN (hypertension)   Chest pain   Encephalopathy acute   Alcohol withdrawal syndrome, with delirium [name])    Discharge Instructions  Discharge Instructions     Diet - low sodium heart healthy   Complete by: As directed    Increase activity slowly   Complete by: As directed       Allergies as of 11/20/2020   No Known Allergies      Medication List     STOP taking these medications    B-12 5000 MCG Caps   cyclobenzaprine 10 MG tablet Commonly known as: FLEXERIL   losartan 100 MG tablet Commonly known as: COZAAR   naproxen sodium 220 MG tablet Commonly known as: ALEVE   sulfamethoxazole-trimethoprim 800-160 MG tablet Commonly known as: BACTRIM DS       TAKE these medications    allopurinol 300 MG tablet Commonly known as: ZYLOPRIM TAKE 1 TABLET BY MOUTH DAILY What changed: how much to take   amLODipine  10 MG tablet Commonly known as: NORVASC TAKE 1 TABLET BY MOUTH DAILY   citalopram 40 MG tablet Commonly known as: CELEXA Take 40 mg by mouth daily.   ferrous sulfate 325 (65 FE) MG tablet Take 1 tablet (325 mg total) by mouth daily with breakfast. Start taking on: November 22, 2023   folic acid 1 MG tablet Commonly known as: FOLVITE Take 1 tablet (1 mg  total) by mouth daily.   furosemide 20 MG tablet Commonly known as: Lasix Take 1 tablet (20 mg total) by mouth daily as needed for edema.   metFORMIN 500 MG tablet Commonly known as: GLUCOPHAGE Take 500 mg by mouth daily.   pantoprazole 40 MG tablet Commonly known as: PROTONIX TAKE 1 TABLET BY MOUTH TWICE DAILY   QUEtiapine 25 MG tablet Commonly known as: SEROQUEL Take 1 tablet (25 mg total) by mouth at bedtime.   thiamine 100 MG tablet Take 1 tablet (100 mg total) by mouth daily.   triamcinolone 0.1%-Eucerin equivalent 1:1 cream mixture Apply 1 application topically 2 (two) times daily as needed for itching.        Follow-up Information     [facility].   Specialty: Home Health Services Why: the office will call to schedule home health visits Contact information: [place] [address] [phone]                No Known Allergies  Consultations: CCM   Procedures/Studies: DG Chest 2 View  Result Date: 11/10/2020 CLINICAL DATA:  Chest pain EXAM: CHEST - 2 VIEW COMPARISON:  10/11/2017 FINDINGS: The heart size and mediastinal contours are within normal limits. Both lungs are clear. Disc degenerative disease of the thoracic spine. IMPRESSION: No acute abnormality of the lungs. Electronically Signed   By: Eddie Candle M.D.   On: 11/10/2020 19:05   CT HEAD WO CONTRAST  Result Date: 11/12/2020 CLINICAL DATA:  Head trauma.  Fall. EXAM: CT HEAD WITHOUT CONTRAST TECHNIQUE: Contiguous axial images were obtained from the base of the skull through the vertex without intravenous contrast. COMPARISON:  None. FINDINGS: Brain: Mild age related volume loss. No evidence of old or recent focal infarction, mass lesion, hemorrhage, hydrocephalus or extra-axial collection. Vascular: There is atherosclerotic calcification of the major vessels at the base of the brain. Skull: No skull fracture. Sinuses/Orbits: Chronic sinusitis of the left  maxillary sinus. Few opacified ethmoid air cells on the left. Orbits are negative. Other: Left forehead and frontal scalp injury. IMPRESSION: Left forehead and frontal scalp injury. No underlying skull fracture. No traumatic intracranial finding. Chronic left maxillary sinusitis. Electronically Signed   By: Nelson Chimes M.D.   On: 11/12/2020 18:33   MR BRAIN WO CONTRAST  Result Date: 11/16/2020 CLINICAL DATA:  Delirium EXAM: MRI HEAD WITHOUT CONTRAST TECHNIQUE: Multiplanar, multiecho pulse sequences of the brain and surrounding structures were obtained without intravenous contrast. COMPARISON:  None. FINDINGS: Motion artifact is present. Brain: There is no acute infarction or intracranial hemorrhage. There is no intracranial mass, mass effect, or edema. There is no hydrocephalus or extra-axial fluid collection. Prominence of the ventricles and sulci reflect generalized parenchymal volume loss. Patchy foci of T2 hyperintensity in the supratentorial white matter are nonspecific but may reflect mild to moderate chronic microvascular ischemic changes. Vascular: Major vessel flow voids at the skull base are preserved. Skull and upper cervical spine: Normal marrow signal is preserved. Sinuses/Orbits: Chronic left maxillary sinusitis. Orbits are unremarkable. Other: Left frontal scalp soft tissue  swelling. Sella is unremarkable. Mastoid air cells are clear. IMPRESSION: Motion degraded. No evidence of recent infarction, hemorrhage, or mass. Chronic microvascular ischemic changes. Electronically Signed   By: Macy Mis M.D.   On: 11/16/2020 20:56   DG CHEST PORT 1 VIEW  Result Date: 11/11/2020 CLINICAL DATA:  Wheezing EXAM: PORTABLE CHEST 1 VIEW COMPARISON:  11/10/2020 FINDINGS: Very low lung volumes. Bibasilar atelectasis. Heart is normal size. No effusions. No acute bony abnormality. IMPRESSION: Low lung volumes with bibasilar atelectasis. Electronically Signed   By: Rolm Baptise M.D.   On: 11/11/2020 23:00    ECHOCARDIOGRAM COMPLETE  Result Date: 11/18/2020    ECHOCARDIOGRAM REPORT   Patient Name:   Tyrone [name] Date of Exam: 11/18/2020 Medical Rec #:  [id number]         Height:       69.0 in Accession #:    [id number]        Weight:       191.1 lb Date of Birth:  1946/04/10         BSA:          2.027 m Patient Age:    4 years          BP:           111/61 mmHg Patient Gender: M                 HR:           77 bpm. Exam Location:  Inpatient Procedure: 2D Echo, Cardiac Doppler and Color Doppler Indications:    Dyspnea  History:        Patient has prior history of Echocardiogram examinations, most                 recent 11/11/2020.  Sonographer:    Cammy Brochure Referring Phys: [id number] Barbara Keng A Aryaan Persichetti IMPRESSIONS  1. Left ventricular ejection fraction, by estimation, is 60 to 65%. The left ventricle has normal function. The left ventricle has no regional wall motion abnormalities. Left ventricular diastolic parameters are consistent with Grade I diastolic dysfunction (impaired relaxation).  2. Right ventricular systolic function is normal. The right ventricular size is normal.  3. Left atrial size was mildly dilated.  4. The mitral valve is normal in structure. Trivial mitral valve regurgitation. No evidence of mitral stenosis.  5. The aortic valve is tricuspid. Aortic valve regurgitation is not visualized. No aortic stenosis is present.  6. The inferior vena cava is normal in size with greater than 50% respiratory variability, suggesting right atrial pressure of 3 mmHg. FINDINGS  Left Ventricle: Left ventricular ejection fraction, by estimation, is 60 to 65%. The left ventricle has normal function. The left ventricle has no regional wall motion abnormalities. Definity contrast agent was given IV to delineate the left ventricular  endocardial borders. The left ventricular internal cavity size was normal in size. There is no left ventricular hypertrophy. Left ventricular diastolic parameters are consistent with  Grade I diastolic dysfunction (impaired relaxation). Right Ventricle: The right ventricular size is normal. Right ventricular systolic function is normal. Left Atrium: Left atrial size was mildly dilated. Right Atrium: Right atrial size was normal in size. Pericardium: There is no evidence of pericardial effusion. Mitral Valve: The mitral valve is normal in structure. Trivial mitral valve regurgitation. No evidence of mitral valve stenosis. Tricuspid Valve: The tricuspid valve is normal in structure. Tricuspid valve regurgitation is not demonstrated. No evidence of tricuspid stenosis. Aortic Valve: The aortic valve  is tricuspid. Aortic valve regurgitation is not visualized. No aortic stenosis is present. Aortic valve mean gradient measures 5.0 mmHg. Aortic valve peak gradient measures 9.0 mmHg. Aortic valve area, by VTI measures 3.29 cm. Pulmonic Valve: The pulmonic valve was normal in structure. Pulmonic valve regurgitation is trivial. No evidence of pulmonic stenosis. Aorta: The aortic root is normal in size and structure. Venous: The inferior vena cava is normal in size with greater than 50% respiratory variability, suggesting right atrial pressure of 3 mmHg. IAS/Shunts: No atrial level shunt detected by color flow Doppler.  LEFT VENTRICLE PLAX 2D LVIDd:         5.10 cm  Diastology LVIDs:         3.50 cm  LV e' medial:    7.94 cm/s LV PW:         1.10 cm  LV E/e' medial:  9.6 LV IVS:        1.00 cm  LV e' lateral:   10.20 cm/s LVOT diam:     2.40 cm  LV E/e' lateral: 7.5 LV SV:         88 LV SV Index:   43 LVOT Area:     4.52 cm  RIGHT VENTRICLE             IVC RV S prime:     12.30 cm/s  IVC diam: 2.20 cm TAPSE (M-mode): 2.5 cm LEFT ATRIUM             Index       RIGHT ATRIUM           Index LA diam:        4.20 cm 2.07 cm/m  RA Area:     12.10 cm LA Vol (A2C):   60.9 ml 30.05 ml/m RA Volume:   26.00 ml  12.83 ml/m LA Vol (A4C):   76.8 ml 37.90 ml/m LA Biplane Vol: 70.1 ml 34.59 ml/m  AORTIC VALVE AV  Area (Vmax):    3.14 cm AV Area (Vmean):   3.20 cm AV Area (VTI):     3.29 cm AV Vmax:           150.00 cm/s AV Vmean:          97.900 cm/s AV VTI:            0.267 m AV Peak Grad:      9.0 mmHg AV Mean Grad:      5.0 mmHg LVOT Vmax:         104.00 cm/s LVOT Vmean:        69.200 cm/s LVOT VTI:          0.194 m LVOT/AV VTI ratio: 0.73  AORTA Ao Root diam: 3.60 cm Ao Asc diam:  3.30 cm MITRAL VALVE MV Area (PHT): 2.99 cm    SHUNTS MV Decel Time: 254 msec    Systemic VTI:  0.19 m MV E velocity: 76.00 cm/s  Systemic Diam: 2.40 cm MV A velocity: 87.50 cm/s MV E/A ratio:  0.87 Kirk Ruths MD Electronically signed by Kirk Ruths MD Signature Date/Time: 11/18/2020/2:45:53 PM    Final    ECHOCARDIOGRAM COMPLETE  Result Date: 11/11/2020    ECHOCARDIOGRAM REPORT   Patient Name:   Tyrone [name] Date of Exam: 11/11/2020 Medical Rec #:  [id number]         Height:       69.0 in Accession #:    [id number]        Weight:  198.2 lb Date of Birth:  [birth year]         BSA:          2.058 m Patient Age:    48 years          BP:           116/71 mmHg Patient Gender: M                 HR:           99 bpm. Exam Location:  Inpatient Procedure: 2D Echo, Cardiac Doppler and Color Doppler Indications:    CHF - Acute systolic  History:        Patient has no prior history of Echocardiogram examinations.                 Signs/Symptoms:Chest Pain and Dyspnea; Risk Factors:Hypertension                 and Former Smoker. GERD. ETOH abuse.  Sonographer:    Clayton Lefort RDCS (AE) Referring Phys: [id number] [facility]  Sonographer Comments: Image acquisition challenging due to uncooperative patient. Unable to complete full echocardiogram due to patient being uncooperative, repeatedly trying to get out of bed during exam. After first attempt nurse notified. Nurse gave medication, and I completed another echocardiogram. Upon returning to patient room to attempt to continue exam, patient with sitter still trying to get out of bed.  IMPRESSIONS  1. The patient was not cooperative during the study and the study was unable to be completed. Grossly there is normal RV/LV function, but further assessment was not performed due to the patient's inability to comply with the examination.  2. Left ventricular ejection fraction, by estimation, is 60 to 65%. The left ventricle has normal function.  3. Right ventricular systolic function is normal. The right ventricular size is normal.  4. The aortic valve is tricuspid. Aortic valve regurgitation is not visualized. No aortic stenosis is present. FINDINGS  Left Ventricle: Left ventricular ejection fraction, by estimation, is 60 to 65%. The left ventricle has normal function. The left ventricular internal cavity size was normal in size. There is no left ventricular hypertrophy. Right Ventricle: The right ventricular size is normal. No increase in right ventricular wall thickness. Right ventricular systolic function is normal. Pericardium: There is no evidence of pericardial effusion. Presence of pericardial fat pad. Tricuspid Valve: The tricuspid valve is grossly normal. Tricuspid valve regurgitation is trivial. No evidence of tricuspid stenosis. Aortic Valve: The aortic valve is tricuspid. Aortic valve regurgitation is not visualized. No aortic stenosis is present. Pulmonic Valve: The pulmonic valve was grossly normal. Pulmonic valve regurgitation is trivial. No evidence of pulmonic stenosis. Aorta: The aortic root and ascending aorta are structurally normal, with no evidence of dilitation.  LEFT VENTRICLE PLAX 2D LVIDd:         4.30 cm LVIDs:         3.00 cm LV PW:         0.92 cm LV IVS:        1.11 cm LVOT diam:     2.20 cm LVOT Area:     3.80 cm  LEFT ATRIUM         Index LA diam:    3.70 cm 1.80 cm/m   AORTA Ao Root diam: 3.80 cm Ao Asc diam:  3.70 cm  SHUNTS Systemic Diam: 2.20 cm Eleonore Chiquito MD Electronically signed by Eleonore Chiquito MD Signature Date/Time: 11/11/2020/10:00:23 AM    Final  Subjective: He relate he wont drink any more alcohol.  He is feeling well   Discharge Exam: Vitals:   11/20/20 0359 11/20/20 0723  BP: (!) 116/47 118/69  Pulse: 65 76  Resp: 18 17  Temp: 98.8 F (37.1 C) 98.7 F (37.1 C)  SpO2: 95% 97%     General: Pt is alert, awake, not in acute distress Cardiovascular: RRR, S1/S2 +, no rubs, no gallops Respiratory: CTA bilaterally, no wheezing, no rhonchi Abdominal: Soft, NT, ND, bowel sounds + Extremities: no edema, no cyanosis    The results of significant diagnostics from this hospitalization (including imaging, microbiology, ancillary and laboratory) are listed below for reference.     Microbiology: Recent Results (from the past 240 hour(s))  SARS CORONAVIRUS 2 (TAT 6-24 HRS) Nasopharyngeal Nasopharyngeal Swab     Status: None   Collection Time: 11/10/20 10:56 PM   Specimen: Nasopharyngeal Swab  Result Value Ref Range Status   SARS Coronavirus 2 NEGATIVE NEGATIVE Final    Comment: (NOTE) SARS-CoV-2 target nucleic acids are NOT DETECTED.  The SARS-CoV-2 RNA is generally detectable in upper and lower respiratory specimens during the acute phase of infection. Negative results do not preclude SARS-CoV-2 infection, do not rule out co-infections with other pathogens, and should not be used as the sole basis for treatment or other patient management decisions. Negative results must be combined with clinical observations, patient history, and epidemiological information. The expected result is Negative.  Fact Sheet for Patients: SugarRoll.be  Fact Sheet for Healthcare Providers: https://www.woods-mathews.com/  This test is not yet approved or cleared by the [place] FDA and  has been authorized for detection and/or diagnosis of SARS-CoV-2 by FDA under an Emergency Use Authorization (EUA). This EUA will remain  in effect (meaning this test can be used) for the duration of  the COVID-19 declaration under Se ction 564(b)(1) of the Act, 21 U.S.C. section 360bbb-3(b)(1), unless the authorization is terminated or revoked sooner.  Performed at [facility], [place] [address], [place], [place] [address]   MRSA Next Gen by PCR, Nasal     Status: None   Collection Time: 11/11/20  3:20 PM   Specimen: Nasal Mucosa; Nasal Swab  Result Value Ref Range Status   MRSA by PCR Next Gen NOT DETECTED NOT DETECTED Final    Comment: (NOTE) The GeneXpert MRSA Assay (FDA approved for NASAL specimens only), is one component of a comprehensive MRSA colonization surveillance program. It is not intended to diagnose MRSA infection nor to guide or monitor treatment for MRSA infections. Test performance is not FDA approved in patients less than 64 years old. Performed at [facility], [place] [address], [place], [place] [address]   MRSA Next Gen by PCR, Nasal     Status: None   Collection Time: 11/13/20  3:29 AM   Specimen: Nasal Mucosa; Nasal Swab  Result Value Ref Range Status   MRSA by PCR Next Gen NOT DETECTED NOT DETECTED Final    Comment: (NOTE) The GeneXpert MRSA Assay (FDA approved for NASAL specimens only), is one component of a comprehensive MRSA colonization surveillance program. It is not intended to diagnose MRSA infection nor to guide or monitor treatment for MRSA infections. Test performance is not FDA approved in patients less than 69 years old. Performed at [facility], [place] [address] [place], [place] [address]      Labs: BNP (last 3 results) Recent Labs    11/10/20 1807  BNP 167.2*  Basic Metabolic Panel: Recent Labs  Lab 11/14/20 0111 11/15/20 1005 11/16/20 0334 11/17/20 0355 11/18/20 0421 11/19/20 0755  NA 132*  --  136 133* 137 137  K 3.6  --  3.8 4.0 4.4 4.2  CL 99  --  102 99 103 102  CO2 28  --  29 26 28 29   GLUCOSE 100*  --  116* 109* 107* 112*  BUN 13  --  7* 12 10 10   CREATININE 1.24  --  1.03 1.14 1.14 1.06   CALCIUM 8.3*  --  8.7* 8.8* 8.8* 8.8*  MG  --  2.1  --  2.1  --   --    Liver Function Tests: No results for input(s): AST, ALT, ALKPHOS, BILITOT, PROT, ALBUMIN in the last 168 hours. No results for input(s): LIPASE, AMYLASE in the last 168 hours. Recent Labs  Lab 11/16/20 0334  AMMONIA 13   CBC: Recent Labs  Lab 11/14/20 0111 11/16/20 0334 11/17/20 0355 11/19/20 0755  WBC 6.1 6.4 6.7 4.5  HGB 7.6* 8.2* 8.4* 8.9*  HCT 24.5* 26.8* 26.6* 28.9*  MCV 91.8 92.1 91.7 92.6  PLT 181 173 174 223   Cardiac Enzymes: No results for input(s): CKTOTAL, CKMB, CKMBINDEX, TROPONINI in the last 168 hours. BNP: Invalid input(s): POCBNP CBG: Recent Labs  Lab 11/19/20 1002 11/19/20 1131 11/19/20 1621 11/19/20 2056 11/20/20 0606  GLUCAP 130* 128* 85 103* 104*   D-Dimer No results for input(s): DDIMER in the last 72 hours. Hgb A1c No results for input(s): HGBA1C in the last 72 hours. Lipid Profile No results for input(s): CHOL, HDL, LDLCALC, TRIG, CHOLHDL, LDLDIRECT in the last 72 hours. Thyroid function studies No results for input(s): TSH, T4TOTAL, T3FREE, THYROIDAB in the last 72 hours.  Invalid input(s): FREET3 Anemia work up No results for input(s): VITAMINB12, FOLATE, FERRITIN, TIBC, IRON, RETICCTPCT in the last 72 hours. Urinalysis    Component Value Date/Time   COLORURINE YELLOW 10/11/2017 1736   APPEARANCEUR CLOUDY (A) 10/11/2017 1736   LABSPEC 1.023 10/11/2017 1736   PHURINE 5.0 10/11/2017 1736   GLUCOSEU NEGATIVE 10/11/2017 1736   HGBUR MODERATE (A) 10/11/2017 1736   BILIRUBINUR NEGATIVE 10/11/2017 1736   KETONESUR NEGATIVE 10/11/2017 1736   PROTEINUR 100 (A) 10/11/2017 1736   UROBILINOGEN 0.2 05/13/2014 1647   NITRITE POSITIVE (A) 10/11/2017 1736   LEUKOCYTESUR MODERATE (A) 10/11/2017 1736   Sepsis Labs Invalid input(s): PROCALCITONIN,  WBC,  LACTICIDVEN Microbiology Recent Results (from the past 240 hour(s))  SARS CORONAVIRUS 2 (TAT 6-24 HRS)  Nasopharyngeal Nasopharyngeal Swab     Status: None   Collection Time: 11/10/20 10:56 PM   Specimen: Nasopharyngeal Swab  Result Value Ref Range Status   SARS Coronavirus 2 NEGATIVE NEGATIVE Final    Comment: (NOTE) SARS-CoV-2 target nucleic acids are NOT DETECTED.  The SARS-CoV-2 RNA is generally detectable in upper and lower respiratory specimens during the acute phase of infection. Negative results do not preclude SARS-CoV-2 infection, do not rule out co-infections with other pathogens, and should not be used as the sole basis for treatment or other patient management decisions. Negative results must be combined with clinical observations, patient history, and epidemiological information. The expected result is Negative.  Fact Sheet for Patients: SugarRoll.be  Fact Sheet for Healthcare Providers: https://www.woods-mathews.com/  This test is not yet approved or cleared by the [place] FDA and  has been authorized for detection and/or diagnosis of SARS-CoV-2 by FDA under an Emergency Use Authorization (EUA). This EUA will remain  in effect (meaning this test can be used) for the duration of the COVID-19 declaration under Se ction 564(b)(1) of the Act, 21 U.S.C. section 360bbb-3(b)(1), unless the authorization is terminated or revoked sooner.  Performed at [facility], [place] [address] [place], [place] [address]   MRSA Next Gen by PCR, Nasal     Status: None   Collection Time: 11/11/20  3:20 PM   Specimen: Nasal Mucosa; Nasal Swab  Result Value Ref Range Status   MRSA by PCR Next Gen NOT DETECTED NOT DETECTED Final    Comment: (NOTE) The GeneXpert MRSA Assay (FDA approved for NASAL specimens only), is one component of a comprehensive MRSA colonization surveillance program. It is not intended to diagnose MRSA infection nor to guide or monitor treatment for MRSA infections. Test performance is not FDA approved in patients  less than 33 years old. Performed at [facility], [place] [address], [place], [place] [address]   MRSA Next Gen by PCR, Nasal     Status: None   Collection Time: 11/13/20  3:29 AM   Specimen: Nasal Mucosa; Nasal Swab  Result Value Ref Range Status   MRSA by PCR Next Gen NOT DETECTED NOT DETECTED Final    Comment: (NOTE) The GeneXpert MRSA Assay (FDA approved for NASAL specimens only), is one component of a comprehensive MRSA colonization surveillance program. It is not intended to diagnose MRSA infection nor to guide or monitor treatment for MRSA infections. Test performance is not FDA approved in patients less than 14 years old. Performed at [facility], [place] [address] [place], [place] [address]      Time coordinating discharge: 40 minutes  SIGNED:   Elmarie Shiley, MD  [facility]

## 2020-11-20 NOTE — Progress Notes (Signed)
Pt awaiting ride home for discharge. Education and paperwork completed, understood by patient. PIV removed, patient dressed independently.   This RN pulled from unit, handoff given to charge RN and unit [place] for community watch until ride is here. VSS, RA, tele off. Will need wheelchair for discharge.   Bindi Klomp RN

## 2020-11-20 NOTE — Care Management Important Message (Signed)
Important Message  Patient Details  Name: CUTLER SUNDAY MRN: [id number] Date of Birth: 1945-07-25   Medicare Important Message Given:  Yes     Dimas Scheck P [place] 11/20/2020, 3:20 PM

## 2020-11-20 NOTE — TOC Transition Note (Signed)
Transition of Care [facility]) - CM/SW Discharge Note   Patient Details  Name: Tyrone [name] MRN: [id number] Date of Birth: February 10, 1946  Transition of Care [facility]) CM/SW Contact:  Zenon Mayo, RN Phone Number: 11/20/2020, 10:12 AM   Clinical Narrative:    Patient is for dc today, NCM notified Amy with [facility].     Final next level of care: Home w Home Health Services Barriers to Discharge: No Barriers Identified   Patient Goals and CMS Choice Patient states their goals for this hospitalization and ongoing recovery are:: return home with wife CMS Medicare.gov Compare Post Acute Care list provided to:: Patient Choice offered to / list presented to : Patient  Discharge Placement                       Discharge Plan and Services In-house Referral: NA Discharge Planning Services: CM Consult Post Acute Care Choice: Home Health          DME Arranged: N/A DME Agency: NA       HH Arranged: PT, OT HH Agency: [place] Date [place]: 11/19/20 Time [place]: 0626 Representative spoke with at [place]: Amy  Social Determinants of Health ([place]) Interventions     Readmission Risk Interventions No flowsheet data found.

## 2020-11-22 ENCOUNTER — Telehealth: Payer: Self-pay

## 2020-11-22 NOTE — Telephone Encounter (Signed)
Transition Care Management Unsuccessful Follow-up Telephone Call  Date of discharge and from where:  11/20/20 from [facility] [place]   Attempts:  2nd Attempt  Reason for unsuccessful TCM follow-up call:  Voice mail full, Spoke with patients daughter in law via Tyrone [name] stated he was doing well,  will retry later

## 2020-11-23 ENCOUNTER — Telehealth: Payer: Self-pay

## 2020-11-23 NOTE — Telephone Encounter (Signed)
Transition Care Management Unsuccessful Follow-up Telephone Call  Date of discharge and from where:  [place] 11/20/20   Attempts:  3rd Attempt  Reason for unsuccessful TCM follow-up call:  Voice mail full  NOTE: Was unable to reach pt directly, spoke with Daughter in law who stated that he has been doing well and is aware of follow up appt along with stating that to her knowledge he had no further questions and understood his discharge. Attempted to reach his son and no answer there as well. [place] Kitchen His HFU is 12/04/20. Please advise

## 2020-11-30 ENCOUNTER — Other Ambulatory Visit: Payer: Self-pay | Admitting: Family Medicine

## 2020-11-30 ENCOUNTER — Telehealth: Payer: Self-pay

## 2020-11-30 NOTE — Telephone Encounter (Signed)
Received a call from [place] that they evaluated him this week - didn't admit patient for home health, refused OT and social work.

## 2020-12-04 ENCOUNTER — Ambulatory Visit (INDEPENDENT_AMBULATORY_CARE_PROVIDER_SITE_OTHER): Payer: Medicare Other | Admitting: Family Medicine

## 2020-12-04 ENCOUNTER — Encounter: Payer: Self-pay | Admitting: Family Medicine

## 2020-12-04 ENCOUNTER — Other Ambulatory Visit: Payer: Self-pay

## 2020-12-04 VITALS — BP 115/67 | HR 88 | Temp 98.0°F | Ht 69.0 in | Wt 184.4 lb

## 2020-12-04 DIAGNOSIS — I10 Essential (primary) hypertension: Secondary | ICD-10-CM

## 2020-12-04 DIAGNOSIS — F1010 Alcohol abuse, uncomplicated: Secondary | ICD-10-CM | POA: Diagnosis not present

## 2020-12-04 DIAGNOSIS — D509 Iron deficiency anemia, unspecified: Secondary | ICD-10-CM | POA: Diagnosis not present

## 2020-12-04 DIAGNOSIS — R5383 Other fatigue: Secondary | ICD-10-CM | POA: Diagnosis not present

## 2020-12-04 MED ORDER — NALTREXONE HCL 50 MG PO TABS: 50.0000 mg | ORAL_TABLET | Freq: Every day | ORAL | 3 refills | Status: DC

## 2020-12-04 MED ORDER — AMLODIPINE BESYLATE 10 MG PO TABS: 5.0000 mg | ORAL_TABLET | Freq: Every day | ORAL | 2 refills | Status: DC

## 2020-12-04 NOTE — Progress Notes (Signed)
Chief Complaint:  Tyrone [name] is a 75 y.o. male who presents today for a TCM visit.  Assessment/Plan:  Chronic Problems Addressed Today: Iron deficiency anemia Found to have iron deficiency anemia while hospitalized.  Likely due to alcohol abuse.  Started on iron supplementation.  He has been tolerating well.  We can recheck in about 3 months.  Had colonoscopy last year which was normal.  HTN (hypertension) Diastolic on the low side today.  Could be contributing some to his fatigue and dizziness.  We will decrease amlodipine to 5 mg daily.  He will continue monitoring at home and check in with me in a couple of weeks.  Alcohol abuse Spent significant time today with patient discussing alcohol abuse.  He has been abstinent since his discharge and thinks that he will be able to will stay abstinent.  We discussed referral for counseling and AA meetings - he is not interested in either of these at this time.  He was initially not interested in medications either.  We discussed possible options.  We will try naltrexone 50 mg daily.  He will check in with me in a couple of weeks via MyChart.  Discussed reasons to return to care.  Fatigue Multifactorial.  Likely largely induced by alcohol use.  Had to have some anemia while hospitalized which is likely the main contributor.  He has been started on iron supplementation.  We can recheck CBC and iron panel in a few months.   Patient has a high level of medical decision making.     Subjective:  HPI:  Summary of Hospital admission: Reason for admission: Chest Pain, Unspecified Date of admission: 11/10/2020 Date of discharge: 11/14/20 Date of Interactive contact: 12/04/2020 Summary of Hospital course:   Patient presented to the ED 11/10/2020 with a cc of SOB and bilateral lower extremity edema about 1 month in duration. He was confused but oriented to name. Alcohol level was 39 on admission. BNP was 167 and chest X-ray was cleared. He was  admitted for dyspnea and suspected CHF. Multiple doses of Ativan was required for agitation.   6/26 patient was transferred to ICU for Precedex drip and encephalopathy - it was thought that there may have been an EtOH withdrawal. Normal LV function. B-12 elevated, RPR negative, HIV non-reactive. MRI shoed no recent infarct.  6/27 he fell while trying to get out of the bed and suffered a laceration to the left side of his forehead - repaired with general surgery - dissolvable sutures by CCS and did not require further intervention. He later on was started on Precedex.  6/28 patient was weaned off of Precedex and started clonidine taper - last dose of this was 7/01. By 6/29 he was stable for transfer out of ICU.  6/30 patient was started on Seroquel - continued on thiamine and folic acid.  It was noted that his mental status did improve. Patient had been reading in his room. He was alert and conversant. Later discharged home with [facility].  Hyponatremia was mild and asked to monitor. Celexa was continued for depression. Norvasc was to be continued with hypertension. An anemia panel was found consistent with iron deficiency - he was started on ferrous sulfate. For acute diastolic HF, presented dyspnea, and LE edema - patient received lasix, BNP 167 was resolved, an ECHO showed Diastolic Dysfunction grade one. He was then found to be euvolemic and resumed home dose PRN lasix.  Interim history:   Patient does not that he  does tend to drink a lot. He was provided resources/counseling for his alcohol abuse- patient does not plan to do this due to feeling as if he can handle the situation on his own. He plans to stop drinking completely. Medications were addressed to him - he declines at the moment and wants to deal with it himself. AA was strongly recommended in the office today -  he is not interested in this option either. Narcan/Naloxone was also discussed today for relapse prevention - patient opts to trial  this medication.  He also states that after the hospitalization, he feels very bad - notes feeling dizzy and unstable. He does check his blood pressure at home- says it is close to the number in the office today. Denies any fainting episodes.   ROS:  otherwise a complete review of systems was negative.   PMH:  The following were reviewed and entered/updated in epic: Past Medical History:  Diagnosis Date   Anemia    Arthritis    hands   Blood transfusion without reported diagnosis    for stomach ulcers   Depression    Hypertension    Kidney stone    Multiple duodenal ulcers    Patient Active Problem List   Diagnosis Date Noted   Iron deficiency anemia 12/04/2020   Degenerative lumbar spinal stenosis 08/03/2020   Hyperglycemia 04/05/2020   Steatohepatitis 10/04/2019   Subclinical hyperthyroidism 09/14/2019   HTN (hypertension) 07/26/2019   GERD (gastroesophageal reflux disease) 06/06/2019   Chronic left-sided lumbar radiculopathy 01/10/2019   Gout 12/03/2018   Senile purpura ([place]) 12/03/2018   B12 deficiency 07/20/2018   Fatigue 07/20/2018   Left foot drop 05/13/2018   Alcohol abuse 05/13/2018   Past Surgical History:  Procedure Laterality Date   ABDOMINAL SURGERY     ulcers   COLONOSCOPY     LITHOTRIPSY      Family History  Problem Relation Age of Onset   Colon cancer Neg Hx    Esophageal cancer Neg Hx    Rectal cancer Neg Hx    Stomach cancer Neg Hx     Medications- Reconciled discharge and current medications in Epic.  Current Outpatient Medications  Medication Sig Dispense Refill   allopurinol (ZYLOPRIM) 300 MG tablet TAKE 1 TABLET BY MOUTH DAILY 90 tablet 4   citalopram (CELEXA) 40 MG tablet TAKE 1 TABLET BY MOUTH EVERY DAY 30 tablet 2   ferrous sulfate 325 (65 FE) MG tablet Take 1 tablet (325 mg total) by mouth daily with breakfast. 30 tablet 3   folic acid (FOLVITE) 1 MG tablet Take 1 tablet (1 mg total) by mouth daily. 30 tablet 0   metFORMIN  (GLUCOPHAGE) 500 MG tablet Take 500 mg by mouth daily.     naltrexone (DEPADE) 50 MG tablet Take 1 tablet (50 mg total) by mouth daily. 90 tablet 3   pantoprazole (PROTONIX) 40 MG tablet TAKE 1 TABLET BY MOUTH TWICE DAILY 60 tablet 3   thiamine 100 MG tablet Take 1 tablet (100 mg total) by mouth daily. 30 tablet 0   amLODipine (NORVASC) 10 MG tablet Take 0.5 tablets (5 mg total) by mouth daily. 90 tablet 2   No current facility-administered medications for this visit.    Allergies-reviewed and updated No Known Allergies  Social History   Socioeconomic History   Marital status: Married    Spouse name: Not on file   Number of children: Not on file   Years of education: Not on file  Highest education level: Not on file  Occupational History   Not on file  Tobacco Use   Smoking status: Former   Smokeless tobacco: Former    Types: Chew    Quit date: 05/30/2007  Substance and Sexual Activity   Alcohol use: Not Currently    Alcohol/week: 0.0 standard drinks    Comment: sober 2.5 months   Drug use: No   Sexual activity: Not on file  Other Topics Concern   Not on file  Social History Narrative   Not on file   Social Determinants of Health   Financial Resource Strain: Not on file  Food Insecurity: Not on file  Transportation Needs: Not on file  Physical Activity: Not on file  Stress: Not on file  Social Connections: Not on file        Objective:  Physical Exam: BP 115/67   Pulse 88   Temp 98 F (36.7 C) (Temporal)   Ht 5\' 9"  (1.753 m)   Wt 184 lb 6.4 oz (83.6 kg)   SpO2 99%   BMI 27.23 kg/m   Gen: NAD, resting comfortably CV: RRR with no murmurs appreciated Pulm: NWOB, CTAB with no crackles, wheezes, or rhonchi GI: Normal bowel sounds present. Soft, Nontender, Nondistended. MSK: No edema, cyanosis, or clubbing noted Skin: Warm, dry Neuro: Grossly normal, moves all extremities Psych: Normal affect and thought content      I,Harris Phan,acting as a scribe  for Dimas Chyle, MD.,have documented all relevant documentation on the behalf of Dimas Chyle, MD,as directed by  Dimas Chyle, MD while in the presence of Dimas Chyle, MD.  I, Dimas Chyle, MD, have reviewed all documentation for this visit. The documentation on 12/04/20 for the exam, diagnosis, procedures, and orders are all accurate and complete.  Time Spent: 45 minutes of total time was spent on the date of the encounter performing the following actions: chart review prior to seeing the patient including his recent hospitalization, obtaining history, performing a medically necessary exam, counseling on the treatment plan, placing orders, and documenting in our EHR.    [name]. [name], MD 12/04/2020 11:41 AM

## 2020-12-04 NOTE — Assessment & Plan Note (Signed)
Multifactorial.  Likely largely induced by alcohol use.  Had to have some anemia while hospitalized which is likely the main contributor.  He has been started on iron supplementation.  We can recheck CBC and iron panel in a few months.

## 2020-12-04 NOTE — Assessment & Plan Note (Signed)
Diastolic on the low side today.  Could be contributing some to his fatigue and dizziness.  We will decrease amlodipine to 5 mg daily.  He will continue monitoring at home and check in with me in a couple of weeks.

## 2020-12-04 NOTE — Assessment & Plan Note (Signed)
Spent significant time today with patient discussing alcohol abuse.  He has been abstinent since his discharge and thinks that he will be able to will stay abstinent.  We discussed referral for counseling and AA meetings - he is not interested in either of these at this time.  He was initially not interested in medications either.  We discussed possible options.  We will try naltrexone 50 mg daily.  He will check in with me in a couple of weeks via MyChart.  Discussed reasons to return to care.

## 2020-12-04 NOTE — Assessment & Plan Note (Signed)
Found to have iron deficiency anemia while hospitalized.  Likely due to alcohol abuse.  Started on iron supplementation.  He has been tolerating well.  We can recheck in about 3 months.  Had colonoscopy last year which was normal.

## 2020-12-04 NOTE — Patient Instructions (Signed)
It was very nice to see you today!  Please try the Narcan.  This should help with your alcohol cravings.  Please reduce your amlodipine to 5 mg daily.  You can cut your pill in half.  Please send me a message in a few weeks to let me know how you are doing.  I would like to see you back in a few months for a follow-up visit.  Please come back to see me sooner if needed.  Take care, Dr [name]  PLEASE NOTE:  If you had any lab tests please let us know if you have not heard back within a few days. You may see your results on mychart before we have a chance to review them but we will give you a call once they are reviewed by [place]. If we ordered any referrals today, please let us know if you have not heard from their office within the next week.   Please try these tips to maintain a healthy lifestyle:  Eat at least 3 REAL meals and 1-2 snacks per day.  Aim for no more than 5 hours between eating.  If you eat breakfast, please do so within one hour of getting up.   Each meal should contain half fruits/vegetables, one quarter protein, and one quarter carbs (no bigger than a computer mouse)  Cut down on sweet beverages. This includes juice, soda, and sweet tea.   Drink at least 1 glass of water with each meal and aim for at least 8 glasses per day  Exercise at least 150 minutes every week.

## 2020-12-10 ENCOUNTER — Telehealth: Payer: Self-pay

## 2020-12-10 NOTE — Telephone Encounter (Signed)
..  Home Health Certification or Plan of Care Tracking  Is this a Certification or Plan of Care?  na  [place]  Order Number:  na  Has charge sheet been attached? na  Where has form been placed:    na   Comments:  States patient has refused OT eval.

## 2020-12-10 NOTE — Telephone Encounter (Signed)
See note

## 2020-12-11 NOTE — Telephone Encounter (Signed)
Noted.  [name]. [name], MD 12/11/2020 8:37 AM

## 2021-01-30 ENCOUNTER — Other Ambulatory Visit: Payer: Self-pay | Admitting: Family Medicine

## 2021-03-02 ENCOUNTER — Other Ambulatory Visit: Payer: Self-pay | Admitting: Family Medicine

## 2021-03-04 ENCOUNTER — Other Ambulatory Visit: Payer: Self-pay | Admitting: Family Medicine

## 2021-03-07 NOTE — Progress Notes (Signed)
Established Patient Office Visit  Subjective:  Patient ID: Tyrone [name], male    DOB: Oct 13, 1945  Age: 75 y.o. MRN: [id number]  CC:  Chief Complaint  Patient presents with   Edema    Pt son reports no injury, denies history of edema, pt reports swelling up to knee, pt does have HTN and is treated for this     HPI Tyrone [name] presents for edema. Here with son.   Son gives most of history.  No hx of swelling or dependent edema Denies claudication, shob, doe, headache, chest pain, palpitations.  No pain with swelling. No breakdown of skin, sloughing, or weeping.  Hx of htn. Borderline high today. Has been compliant with treatment  No recent changes to activity, diet, or other lifestyle changes.   Past Medical History:  Diagnosis Date   Anemia    Arthritis    hands   Blood transfusion without reported diagnosis    for stomach ulcers   Depression    Hypertension    Kidney stone    Multiple duodenal ulcers     Past Surgical History:  Procedure Laterality Date   ABDOMINAL SURGERY     ulcers   COLONOSCOPY     LITHOTRIPSY      Family History  Problem Relation Age of Onset   Colon cancer Neg Hx    Esophageal cancer Neg Hx    Rectal cancer Neg Hx    Stomach cancer Neg Hx     Social History   Socioeconomic History   Marital status: Married    Spouse name: Not on file   Number of children: Not on file   Years of education: Not on file   Highest education level: Not on file  Occupational History   Not on file  Tobacco Use   Smoking status: Former   Smokeless tobacco: Former    Types: Chew    Quit date: 05/30/2007  Substance and Sexual Activity   Alcohol use: Not Currently    Alcohol/week: 0.0 standard drinks    Comment: sober 2.5 months   Drug use: No   Sexual activity: Not on file  Other Topics Concern   Not on file  Social History Narrative   Not on file   Social Determinants of Health   Financial Resource Strain: Not on file  Food  Insecurity: Not on file  Transportation Needs: Not on file  Physical Activity: Not on file  Stress: Not on file  Social Connections: Not on file  Intimate Partner Violence: Not on file    Outpatient Medications Prior to Visit  Medication Sig Dispense Refill   allopurinol (ZYLOPRIM) 300 MG tablet TAKE 1 TABLET BY MOUTH DAILY 90 tablet 4   amLODipine (NORVASC) 10 MG tablet TAKE 1 TABLET BY MOUTH DAILY 90 tablet 2   Cyanocobalamin (B-12) 5000 MCG CAPS Take by mouth 2 (two) times daily.     cyclobenzaprine (FLEXERIL) 10 MG tablet Take 1 tablet (10 mg total) by mouth 3 (three) times daily as needed for muscle spasms. 30 tablet 0   folic acid (FOLVITE) 1 MG tablet Take 1 mg by mouth daily.     pantoprazole (PROTONIX) 40 MG tablet TAKE 1 TABLET BY MOUTH TWICE DAILY 60 tablet 3   No facility-administered medications prior to visit.    No Known Allergies  ROS Review of Systems  Constitutional: Negative.   HENT: Negative.    Eyes: Negative.   Respiratory: Negative.    Cardiovascular: Negative.  Gastrointestinal: Negative.   Genitourinary: Negative.   Musculoskeletal: Negative.   Skin: Negative.   Neurological: Negative.   Psychiatric/Behavioral: Negative.    All other systems reviewed and are negative.    Objective:    Physical Exam Constitutional:      General: He is not in acute distress.    Appearance: Normal appearance. He is normal weight. He is not ill-appearing, toxic-appearing or diaphoretic.  Cardiovascular:     Rate and Rhythm: Normal rate and regular rhythm.     Heart sounds: Normal heart sounds. No murmur heard.   No friction rub. No gallop.  Pulmonary:     Effort: Pulmonary effort is normal. No respiratory distress.     Breath sounds: Normal breath sounds. No stridor. No wheezing, rhonchi or rales.  Chest:     Chest wall: No tenderness.  Musculoskeletal:        General: No swelling, tenderness, deformity or signs of injury. Normal range of motion.     Right  lower leg: Edema present.     Left lower leg: No edema.  Skin:    General: Skin is warm and dry.     Coloration: Skin is not jaundiced or pale.     Findings: Erythema (around swelling both lower extremities) present. No bruising, lesion or rash.  Neurological:     General: No focal deficit present.     Mental Status: He is alert and oriented to person, place, and time. Mental status is at baseline.  Psychiatric:        Mood and Affect: Mood normal.        Behavior: Behavior normal.        Thought Content: Thought content normal.        Judgment: Judgment normal.    BP (!) 130/93  Wt Readings from Last 3 Encounters:  12/04/20 184 lb 6.4 oz (83.6 kg)  11/20/20 190 lb 8 oz (86.4 kg)  10/03/20 197 lb 3.2 oz (89.4 kg)     Health Maintenance Due  Topic Date Due   Pneumonia Vaccine 54+ Years old (1 - PCV) Never done   Zoster Vaccines- Shingrix (1 of 2) Never done   COVID-19 Vaccine (3 - Moderna risk series) 01/21/2020   INFLUENZA VACCINE  12/17/2020    There are no preventive care reminders to display for this patient.  Lab Results  Component Value Date   TSH 1.34 07/09/2020   Lab Results  Component Value Date   WBC 4.5 11/19/2020   HGB 8.9 (L) 11/19/2020   HCT 28.9 (L) 11/19/2020   MCV 92.6 11/19/2020   PLT 223 11/19/2020   Lab Results  Component Value Date   NA 137 11/19/2020   K 4.2 11/19/2020   CO2 29 11/19/2020   GLUCOSE 112 (H) 11/19/2020   BUN 10 11/19/2020   CREATININE 1.06 11/19/2020   BILITOT 0.9 11/13/2020   ALKPHOS 86 11/13/2020   AST 54 (H) 11/13/2020   ALT 28 11/13/2020   PROT 5.5 (L) 11/13/2020   ALBUMIN 3.1 (L) 11/13/2020   CALCIUM 8.8 (L) 11/19/2020   ANIONGAP 6 11/19/2020   GFR 53.49 (L) 07/09/2020   Lab Results  Component Value Date   CHOL 152 04/05/2020   Lab Results  Component Value Date   HDL 55 04/05/2020   Lab Results  Component Value Date   LDLCALC 83 04/05/2020   Lab Results  Component Value Date   TRIG 65 04/05/2020    Lab Results  Component Value Date  CHOLHDL 2.8 04/05/2020   Lab Results  Component Value Date   HGBA1C 6.1 (H) 11/10/2020      Assessment & Plan:   Problem List Items Addressed This Visit   None Visit Diagnoses     Cellulitis of lower extremity, unspecified laterality    -  Primary       Meds ordered this encounter  Medications   DISCONTD: sulfamethoxazole-trimethoprim (BACTRIM DS) 800-160 MG tablet    Sig: Take 1 tablet by mouth 2 (two) times daily.    Dispense:  14 tablet    Refill:  0    Order Specific Question:   Supervising Provider    Answer:   Carlota Raspberry, JEFFREY R [[id number]]   DISCONTD: furosemide (LASIX) 20 MG tablet    Sig: Take 1 tablet (20 mg total) by mouth daily as needed for edema.    Dispense:  10 tablet    Refill:  0    Order Specific Question:   Supervising Provider    Answer:   Carlota Raspberry, JEFFREY R [[id number]]    Follow-up: No follow-ups on file.   PLAN Concern for developing cellulitis. Will treat with bactrim.  Giving 10 day course of lasix to help relieve swelling. Discussed risks, benefits, and AE to these medications with pt and son, who expressed understanding Close follow up with PCP if symptoms persist or worsen. Patient encouraged to call clinic with any questions, comments, or concerns.  Maximiano Coss, NP

## 2021-03-11 ENCOUNTER — Other Ambulatory Visit: Payer: Self-pay | Admitting: Family Medicine

## 2021-03-12 ENCOUNTER — Other Ambulatory Visit: Payer: Self-pay | Admitting: Physician Assistant

## 2021-03-12 ENCOUNTER — Ambulatory Visit (INDEPENDENT_AMBULATORY_CARE_PROVIDER_SITE_OTHER): Payer: Medicare Other | Admitting: Physician Assistant

## 2021-03-12 ENCOUNTER — Other Ambulatory Visit: Payer: Self-pay

## 2021-03-12 ENCOUNTER — Encounter: Payer: Self-pay | Admitting: Physician Assistant

## 2021-03-12 VITALS — BP 140/80 | HR 77 | Temp 98.2°F | Ht 69.0 in | Wt 183.2 lb

## 2021-03-12 DIAGNOSIS — Z23 Encounter for immunization: Secondary | ICD-10-CM | POA: Diagnosis not present

## 2021-03-12 DIAGNOSIS — R21 Rash and other nonspecific skin eruption: Secondary | ICD-10-CM

## 2021-03-12 DIAGNOSIS — L299 Pruritus, unspecified: Secondary | ICD-10-CM

## 2021-03-12 DIAGNOSIS — R7989 Other specified abnormal findings of blood chemistry: Secondary | ICD-10-CM

## 2021-03-12 LAB — COMPREHENSIVE METABOLIC PANEL
ALT: 12 U/L (ref 0–53)
AST: 18 U/L (ref 0–37)
Albumin: 4.1 g/dL (ref 3.5–5.2)
Alkaline Phosphatase: 87 U/L (ref 39–117)
BUN: 12 mg/dL (ref 6–23)
CO2: 28 mEq/L (ref 19–32)
Calcium: 9.7 mg/dL (ref 8.4–10.5)
Chloride: 103 mEq/L (ref 96–112)
Creatinine, Ser: 0.96 mg/dL (ref 0.40–1.50)
GFR: 77.31 mL/min (ref >60.00)
Glucose, Bld: 98 mg/dL (ref 70–99)
Potassium: 4.1 mEq/L (ref 3.5–5.1)
Sodium: 140 mEq/L (ref 135–145)
Total Bilirubin: 0.8 mg/dL (ref 0.2–1.2)
Total Protein: 7.3 g/dL (ref 6.0–8.3)

## 2021-03-12 LAB — CBC WITH DIFFERENTIAL/PLATELET
Basophils Absolute: 0.1 10*3/uL (ref 0.0–0.1)
Basophils Relative: 0.8 % (ref 0.0–3.0)
Eosinophils Absolute: 0.2 10*3/uL (ref 0.0–0.7)
Eosinophils Relative: 3.4 % (ref 0.0–5.0)
HCT: 36.1 % — ABNORMAL LOW (ref 39.0–52.0)
Hemoglobin: 11.5 g/dL — ABNORMAL LOW (ref 13.0–17.0)
Lymphocytes Relative: 21.1 % (ref 12.0–46.0)
Lymphs Abs: 1.5 10*3/uL (ref 0.7–4.0)
MCHC: 31.8 g/dL (ref 30.0–36.0)
MCV: 92.1 fl (ref 78.0–100.0)
Monocytes Absolute: 0.5 10*3/uL (ref 0.1–1.0)
Monocytes Relative: 6.7 % (ref 3.0–12.0)
Neutro Abs: 4.9 10*3/uL (ref 1.4–7.7)
Neutrophils Relative %: 68 % (ref 43.0–77.0)
Platelets: 232 10*3/uL (ref 150.0–400.0)
RBC: 3.92 Mil/uL — ABNORMAL LOW (ref 4.22–5.81)
RDW: 14.5 % (ref 11.5–15.5)
WBC: 7.1 10*3/uL (ref 4.0–10.5)

## 2021-03-12 LAB — TSH: TSH: 0.32 u[IU]/mL — ABNORMAL LOW (ref 0.35–5.50)

## 2021-03-12 MED ORDER — FLUCONAZOLE 150 MG PO TABS: ORAL_TABLET | ORAL | 0 refills | Status: DC

## 2021-03-12 MED ORDER — KETOCONAZOLE 2 % EX CREA: TOPICAL_CREAM | CUTANEOUS | 0 refills | Status: DC

## 2021-03-12 NOTE — Progress Notes (Signed)
Tyrone [name] is a 75 y.o. male here for a new problem.  History of Present Illness:   Chief Complaint  Patient presents with   Insomnia    Pt c/o trouble sleeping x 1.5 yrs, worse past couple months.   Rash   Pruritis   Pruritus Has been diagnosed with prurigo nodularis in the past. Notes reviewed from PCP on 07/10/19. Tyrone presents with c/o trouble sleeping that has been ongoing for a year and a half, worsening over the past couple of months. His ability to stay asleep has been decreased due to constant itching sensation.   Mr. [name] also c/o a red rash as well as increased pruritis that has been ongoing for at least a year. Within the last couple of months he has noticed the rash worsening underneath armpits and around his genital region. States he has tried multiple OTC topical medications which have provided no relief. Denies dry skin, hx of skin issues, and alcohol intake. 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   Past Medical History:  Diagnosis Date   Anemia    Arthritis    hands   Blood transfusion without reported diagnosis    for stomach ulcers   Depression    Hypertension    Kidney stone    Multiple duodenal ulcers      Social History   Tobacco Use   Smoking status: Former   Smokeless tobacco: Former    Types: Chew    Quit date: 05/30/2007  Substance Use Topics   Alcohol use: Not Currently    Alcohol/week: 0.0 standard drinks    Comment: sober 2.5 months   Drug use: No    Past Surgical History:  Procedure Laterality Date   ABDOMINAL SURGERY     ulcers   COLONOSCOPY     LITHOTRIPSY      Family History  Problem Relation Age of Onset   Colon cancer Neg Hx    Esophageal cancer Neg Hx    Rectal cancer Neg Hx    Stomach cancer Neg Hx     No Known Allergies  Current Medications:   Current Outpatient Medications:    allopurinol (ZYLOPRIM) 300 MG tablet, TAKE 1 TABLET BY MOUTH DAILY, Disp: 90 tablet, Rfl: 3   amLODipine (NORVASC) 10  MG tablet, Take 0.5 tablets (5 mg total) by mouth daily., Disp: 90 tablet, Rfl: 2   pantoprazole (PROTONIX) 40 MG tablet, TAKE 1 TABLET BY MOUTH TWICE DAILY, Disp: 60 tablet, Rfl: 3   thiamine 100 MG tablet, Take 1 tablet (100 mg total) by mouth daily., Disp: 30 tablet, Rfl: 0   citalopram (CELEXA) 40 MG tablet, TAKE 1 TABLET BY MOUTH EVERY DAY (Patient not taking: Reported on 03/12/2021), Disp: 30 tablet, Rfl: 2   ferrous sulfate 325 (65 FE) MG tablet, Take 1 tablet (325 mg total) by mouth daily with breakfast. (Patient not taking: Reported on 03/12/2021), Disp: 30 tablet, Rfl: 3   Review of Systems:   Review of Systems  Skin:  Positive for rash.  Psychiatric/Behavioral:  The patient has insomnia.   Negative unless otherwise specified per HPI.  Vitals:   Vitals:   03/12/21 1303  BP: 140/80  Pulse: 77  Temp: 98.2 F (36.8 C)  TempSrc: Temporal  SpO2: 97%  Weight: 183 lb 4 oz (83.1 kg)  Height: 5\' 9"  (1.753 m)     Body mass index is 27.06 kg/m.  Physical Exam:   Physical Exam Vitals and nursing note  reviewed.  Constitutional:      General: He is not in acute distress.    Appearance: He is well-developed. He is not ill-appearing or toxic-appearing.  Cardiovascular:     Rate and Rhythm: Normal rate and regular rhythm.     Pulses: Normal pulses.     Heart sounds: Normal heart sounds, S1 normal and S2 normal.  Pulmonary:     Effort: Pulmonary effort is normal.     Breath sounds: Normal breath sounds.  Skin:    General: Skin is warm and dry.     Comments: Well defined erythematous patches in bilateral axilla with distinct borders and central clearing  Neurological:     Mental Status: He is alert.     GCS: GCS eye subscore is 4. GCS verbal subscore is 5. GCS motor subscore is 6.  Psychiatric:        Speech: Speech normal.        Behavior: Behavior normal. Behavior is cooperative.    Assessment and Plan:   Rash and nonspecific skin eruption Suspect possible fungal  infection in axilla Trial topical ketoconazole ointment prn to affected area 1-2 times daily Trial weekly diflucan x 4 weeks If no improvement or any worsening, f/u with PCP   Pruritus Suspect due to underlying condition, prurigo nodularis Will trial daily zyrtec Update blood work today Follow-up with PCP if persists  Need for immunization against influenza Updated today   I,Havlyn C Ratchford,acting as a scribe for [employer], PA.,have documented all relevant documentation on the behalf of Inda Coke, PA,as directed by  Inda Coke, PA while in the presence of Inda Coke, [place].   I, Inda Coke, [place], have reviewed all documentation for this visit. The documentation on 03/12/21 for the exam, diagnosis, procedures, and orders are all accurate and complete.   Inda Coke, PA-C

## 2021-03-12 NOTE — Patient Instructions (Signed)
It was great to see you!  For your itching: May use over the counter antihistamines such as Zyrtec (cetirizine), Claritin (loratadine), Allegra (fexofenadine), or Xyzal (levocetirizine) daily as needed. May take twice a day if needed as long as it does not cause drowsiness. Take 1 Diflucan tablet weekly for 4 weeks Use topical ketoconazole ointment in affected areas as needed  If any worsening or lack of improvement please follow-up with Dr. [name]  Take care,  Inda Coke PA-C

## 2021-03-13 ENCOUNTER — Other Ambulatory Visit: Payer: Medicare Other

## 2021-03-13 DIAGNOSIS — Z20828 Contact with and (suspected) exposure to other viral communicable diseases: Secondary | ICD-10-CM | POA: Diagnosis not present

## 2021-03-13 LAB — T4, FREE: Free T4: 0.95 ng/dL (ref 0.60–1.60)

## 2021-03-13 NOTE — Addendum Note (Signed)
Addended by: Loura Back on: 03/13/2021 08:19 AM   Modules accepted: Orders

## 2021-04-08 ENCOUNTER — Other Ambulatory Visit: Payer: Self-pay

## 2021-04-08 ENCOUNTER — Ambulatory Visit (INDEPENDENT_AMBULATORY_CARE_PROVIDER_SITE_OTHER): Payer: Medicare Other | Admitting: Family Medicine

## 2021-04-08 ENCOUNTER — Encounter: Payer: Self-pay | Admitting: Family Medicine

## 2021-04-08 VITALS — BP 142/78 | HR 75 | Temp 98.3°F | Ht 69.0 in | Wt 189.2 lb

## 2021-04-08 DIAGNOSIS — I10 Essential (primary) hypertension: Secondary | ICD-10-CM | POA: Diagnosis not present

## 2021-04-08 DIAGNOSIS — E559 Vitamin D deficiency, unspecified: Secondary | ICD-10-CM

## 2021-04-08 DIAGNOSIS — M109 Gout, unspecified: Secondary | ICD-10-CM | POA: Diagnosis not present

## 2021-04-08 DIAGNOSIS — Z23 Encounter for immunization: Secondary | ICD-10-CM | POA: Diagnosis not present

## 2021-04-08 DIAGNOSIS — D509 Iron deficiency anemia, unspecified: Secondary | ICD-10-CM | POA: Diagnosis not present

## 2021-04-08 DIAGNOSIS — K7581 Nonalcoholic steatohepatitis (NASH): Secondary | ICD-10-CM

## 2021-04-08 DIAGNOSIS — R739 Hyperglycemia, unspecified: Secondary | ICD-10-CM | POA: Diagnosis not present

## 2021-04-08 DIAGNOSIS — K219 Gastro-esophageal reflux disease without esophagitis: Secondary | ICD-10-CM | POA: Diagnosis not present

## 2021-04-08 DIAGNOSIS — E538 Deficiency of other specified B group vitamins: Secondary | ICD-10-CM

## 2021-04-08 DIAGNOSIS — L299 Pruritus, unspecified: Secondary | ICD-10-CM

## 2021-04-08 DIAGNOSIS — E0590 Thyrotoxicosis, unspecified without thyrotoxic crisis or storm: Secondary | ICD-10-CM

## 2021-04-08 DIAGNOSIS — E785 Hyperlipidemia, unspecified: Secondary | ICD-10-CM | POA: Diagnosis not present

## 2021-04-08 LAB — LIPID PANEL
Cholesterol: 166 mg/dL (ref 0–200)
HDL: 52.8 mg/dL (ref >39.00)
LDL Cholesterol: 90 mg/dL (ref 0–99)
NonHDL: 113.4
Total CHOL/HDL Ratio: 3
Triglycerides: 118 mg/dL (ref 0.0–149.0)
VLDL: 23.6 mg/dL (ref 0.0–40.0)

## 2021-04-08 LAB — HEMOGLOBIN A1C: Hgb A1c MFr Bld: 6.1 % (ref 4.6–6.5)

## 2021-04-08 LAB — T3, FREE: T3, Free: 3.2 pg/mL (ref 2.3–4.2)

## 2021-04-08 LAB — COMPREHENSIVE METABOLIC PANEL
ALT: 14 U/L (ref 0–53)
AST: 18 U/L (ref 0–37)
Albumin: 4.1 g/dL (ref 3.5–5.2)
Alkaline Phosphatase: 94 U/L (ref 39–117)
BUN: 13 mg/dL (ref 6–23)
CO2: 31 mEq/L (ref 19–32)
Calcium: 9.2 mg/dL (ref 8.4–10.5)
Chloride: 102 mEq/L (ref 96–112)
Creatinine, Ser: 0.99 mg/dL (ref 0.40–1.50)
GFR: 74.47 mL/min (ref >60.00)
Glucose, Bld: 107 mg/dL — ABNORMAL HIGH (ref 70–99)
Potassium: 4.7 mEq/L (ref 3.5–5.1)
Sodium: 139 mEq/L (ref 135–145)
Total Bilirubin: 0.5 mg/dL (ref 0.2–1.2)
Total Protein: 6.8 g/dL (ref 6.0–8.3)

## 2021-04-08 LAB — VITAMIN B12: Vitamin B-12: 367 pg/mL (ref 211–911)

## 2021-04-08 LAB — IBC + FERRITIN
Ferritin: 7.8 ng/mL — ABNORMAL LOW (ref 22.0–322.0)
Iron: 35 ug/dL — ABNORMAL LOW (ref 42–165)
Saturation Ratios: 8.5 % — ABNORMAL LOW (ref 20.0–50.0)
TIBC: 411.6 ug/dL (ref 250.0–450.0)
Transferrin: 294 mg/dL (ref 212.0–360.0)

## 2021-04-08 LAB — CBC
HCT: 35.5 % — ABNORMAL LOW (ref 39.0–52.0)
Hemoglobin: 11.3 g/dL — ABNORMAL LOW (ref 13.0–17.0)
MCHC: 31.9 g/dL (ref 30.0–36.0)
MCV: 93 fl (ref 78.0–100.0)
Platelets: 239 10*3/uL (ref 150.0–400.0)
RBC: 3.82 Mil/uL — ABNORMAL LOW (ref 4.22–5.81)
RDW: 14.2 % (ref 11.5–15.5)
WBC: 7.4 10*3/uL (ref 4.0–10.5)

## 2021-04-08 LAB — TSH: TSH: 0.68 u[IU]/mL (ref 0.35–5.50)

## 2021-04-08 LAB — VITAMIN D 25 HYDROXY (VIT D DEFICIENCY, FRACTURES): VITD: 18.14 ng/mL — ABNORMAL LOW (ref 30.00–100.00)

## 2021-04-08 LAB — T4, FREE: Free T4: 0.71 ng/dL (ref 0.60–1.60)

## 2021-04-08 MED ORDER — HYDROXYZINE HCL 50 MG PO TABS: 50.0000 mg | ORAL_TABLET | Freq: Three times a day (TID) | ORAL | 0 refills | Status: DC | PRN

## 2021-04-08 NOTE — Assessment & Plan Note (Signed)
Check iron panel.  He is no longer taking iron supplement.

## 2021-04-08 NOTE — Assessment & Plan Note (Signed)
Check A1c. 

## 2021-04-08 NOTE — Assessment & Plan Note (Signed)
At goal per JNC-8.  Continue amlodipine 5 mg daily.  Check labs.

## 2021-04-08 NOTE — Progress Notes (Signed)
   Tyrone [name] is a 75 y.o. male who presents today for an office visit.  Assessment/Plan:  New/Acute Problems: Rib Pain Consistent with costochondritis.  No red flags.  Given that symptoms are improving we will continue with watchful waiting.  Offered prescription for NSAIDs however he declined.  Discussed reasons to return to care.  Chronic Problems Addressed Today: Iron deficiency anemia Check iron panel.  He is no longer taking iron supplement.  Hyperglycemia Check A1c.   Steatohepatitis Check CMET.   Subclinical hyperthyroidism Could be contributing to pruritus.  We will check TSH, T4, and T3.  HTN (hypertension) At goal per JNC-8.  Continue amlodipine 5 mg daily.  Check labs.  GERD (gastroesophageal reflux disease) Stable on Protonix 40 mg daily.  Gout No recent flares.  Continue allopurinol 300 mg daily.  B12 deficiency Check B12.  Pruritus Has tried and failed several treatment options including over-the-counter second-generation antihistamine, topical Sarna.  May have some xerosis cutis that may also have prurigo nodularis.  We will start hydroxyzine.  Could consider trial of doxepin if continues to be an issue.  Still has ongoing issues despite these interventions will refer to dermatology.     Subjective:  HPI: CC of the Pt is itching. This itching is severe enough that it interferes with his sleep, stating that he's "lucky to get 4 hours of sleep a night". Allergy medication and  itching cremes/treatments have proven ineffective in alleviating the condition. He states that the itching is present "all over". This has been going on for around a year.  Also, he notes having rib pain along his right side, above the liver. This pain is constantly present but mildly. The pain worsens if he lays down on it, so he avoids sleeping on his right side. This started around two weeks ago. He denies having any falls or noticing any other recent cause to warrant this  condition. The area is tender to palpation. He states that this condition has improved since the onset two weeks ago.  He is compliant with all medication with no notable side effects.  See a/p for status of chronic conditions.         Objective:  Physical Exam: BP (!) 142/78   Pulse 75   Temp 98.3 F (36.8 C) (Temporal)   Ht 5\' 9"  (1.753 m)   Wt 189 lb 4 oz (85.8 kg)   SpO2 99%   BMI 27.95 kg/m   Gen: No acute distress, resting comfortably CV: Regular rate and rhythm with no murmurs appreciated Pulm: Normal work of breathing, clear to auscultation bilaterally with no crackles, wheezes, or rhonchi GI: S, NT, ND.  Murphy sign negative.  Tenderness  to palpation along lower costal margin.  Palpation of this area reproduces pain. Neuro: Grossly normal, moves all extremities Psych: Normal affect and thought content HEENT: Mild fluid buildup in right ear canal.     I,Tyrone [name],acting as a scribe for Dimas Chyle, MD.,have documented all relevant documentation on the behalf of Dimas Chyle, MD,as directed by  Dimas Chyle, MD while in the presence of Dimas Chyle, MD.  I, Dimas Chyle, MD, have reviewed all documentation for this visit. The documentation on 04/08/21 for the exam, diagnosis, procedures, and orders are all accurate and complete.  [name]. [name], MD 04/08/2021 10:00 AM

## 2021-04-08 NOTE — Assessment & Plan Note (Signed)
Stable on Protonix 40 mg daily. 

## 2021-04-08 NOTE — Assessment & Plan Note (Signed)
Check CMET. 

## 2021-04-08 NOTE — Patient Instructions (Signed)
It was very nice to see you today!  Please try the hydroxyzine to help with your itching.  Let me know how this is working for you in a few weeks.  I think you have some inflammation in your rib cage.  Please let me know if this does not continue to improve.   We will check blood work today.  We will give your pneumonia vaccine.  We will see back in 1 year for your next annual checkup.  Please come back sooner if needed.  Take care, Dr [name]  PLEASE NOTE:  If you had any lab tests please let us know if you have not heard back within a few days. You may see your results on mychart before we have a chance to review them but we will give you a call once they are reviewed by [place]. If we ordered any referrals today, please let us know if you have not heard from their office within the next week.   Please try these tips to maintain a healthy lifestyle:  Eat at least 3 REAL meals and 1-2 snacks per day.  Aim for no more than 5 hours between eating.  If you eat breakfast, please do so within one hour of getting up.   Each meal should contain half fruits/vegetables, one quarter protein, and one quarter carbs (no bigger than a computer mouse)  Cut down on sweet beverages. This includes juice, soda, and sweet tea.   Drink at least 1 glass of water with each meal and aim for at least 8 glasses per day  Exercise at least 150 minutes every week.

## 2021-04-08 NOTE — Assessment & Plan Note (Addendum)
Could be contributing to pruritus.  We will check TSH, T4, and T3.

## 2021-04-08 NOTE — Assessment & Plan Note (Signed)
No recent flares.  Continue allopurinol 300 mg daily. 

## 2021-04-08 NOTE — Assessment & Plan Note (Signed)
Check B12 

## 2021-04-08 NOTE — Assessment & Plan Note (Signed)
Has tried and failed several treatment options including over-the-counter second-generation antihistamine, topical Sarna.  May have some xerosis cutis that may also have prurigo nodularis.  We will start hydroxyzine.  Could consider trial of doxepin if continues to be an issue.  Still has ongoing issues despite these interventions will refer to dermatology.

## 2021-04-09 ENCOUNTER — Other Ambulatory Visit: Payer: Self-pay | Admitting: *Deleted

## 2021-04-09 MED ORDER — VITAMIN D (ERGOCALCIFEROL) 1.25 MG (50000 UNIT) PO CAPS: 50000.0000 [IU] | ORAL_CAPSULE | ORAL | 0 refills | Status: DC

## 2021-04-09 NOTE — Progress Notes (Signed)
Please inform patient of the following:  His blood sugar is borderline but stable. Do not need to start meds for this but he should continue working on diet and exercise and we can rehceck in a year.   His iron counts are still low. Recommend he follow up with GI to make sure he does not have any ongoing sources of bleeding. He should be taking ferrous sulfate 65mg  every other day on an empty stomach if he is not doing so already.   His Vitamin D is low. Recommend starting 50000IU weekly. We can recheck in 3-6 months.   Tyrone [name]. [name], MD 04/09/2021 12:32 PM

## 2021-04-15 ENCOUNTER — Telehealth: Payer: Self-pay | Admitting: Family Medicine

## 2021-04-15 NOTE — Telephone Encounter (Signed)
Copied from [place] [phone]. Topic: Medicare AWV >> Apr 15, 2021  9:42 AM Harris-Coley, Hannah Beat wrote: Reason for CRM: Left message for patient to schedule Annual Wellness Visit.  Please schedule with Nurse Health Advisor Charlott Rakes, RN at [facility].  Please call [phone] ask for [facility]

## 2021-04-16 ENCOUNTER — Telehealth: Payer: Self-pay

## 2021-04-16 DIAGNOSIS — Z20828 Contact with and (suspected) exposure to other viral communicable diseases: Secondary | ICD-10-CM | POA: Diagnosis not present

## 2021-04-16 NOTE — Telephone Encounter (Signed)
[employer], Tyrone [name] called in stating that the hydrOXYzine (ATARAX/VISTARIL) 50 MG tablet was denied due to being non formulary and for being prescribed as a non FDA label or medical use.

## 2021-04-16 NOTE — Telephone Encounter (Signed)
Please let patient know insurance will not pay for this.  Tyrone [name]. [name], MD 04/16/2021 2:07 PM

## 2021-04-16 NOTE — Telephone Encounter (Signed)
PA done today Rx Hydroxyzine and was denied today

## 2021-05-16 DIAGNOSIS — Z20828 Contact with and (suspected) exposure to other viral communicable diseases: Secondary | ICD-10-CM | POA: Diagnosis not present

## 2021-05-31 ENCOUNTER — Other Ambulatory Visit: Payer: Self-pay | Admitting: Family Medicine

## 2021-06-14 ENCOUNTER — Telehealth: Payer: Self-pay | Admitting: Family Medicine

## 2021-06-14 NOTE — Chronic Care Management (AMB) (Signed)
°  Chronic Care Management   Outreach Note  06/14/2021 Name: BETHEL GAGLIO MRN: [id number] DOB: 04/20/1946  Referred by: Vivi Barrack, MD Reason for referral : No chief complaint on file.   An unsuccessful telephone outreach was attempted today. The patient was referred to the pharmacist for assistance with care management and care coordination.   Follow Up Plan:   Tatjana Dellinger Upstream Scheduler

## 2021-06-17 ENCOUNTER — Telehealth: Payer: Self-pay | Admitting: Family Medicine

## 2021-06-17 NOTE — Chronic Care Management (AMB) (Signed)
°  Chronic Care Management   Outreach Note  06/17/2021 Name: HUMBERTO ADDO MRN: [id number] DOB: [birth year]  Referred by: Vivi Barrack, MD Reason for referral : No chief complaint on file.   A second unsuccessful telephone outreach was attempted today. The patient was referred to pharmacist for assistance with care management and care coordination.  Follow Up Plan:  Tatjana Dellinger Upstream Scheduler

## 2021-06-25 ENCOUNTER — Telehealth: Payer: Self-pay | Admitting: Pharmacist

## 2021-06-25 NOTE — Chronic Care Management (AMB) (Signed)
° ° °  Chronic Care Management Pharmacy Assistant   Name: Tyrone [name]  MRN: [id number] DOB: 07/09/1945  Patient was called to revisit his CCM initial visit  that was cancelled , I spoke with patients daughter Tyrone [name], and she advised me that her father is doing very well, that the family makes sure all medications are well managed, Tyrone [name] added that the patient does not have any concerns to report, and they are very pleased with their local pharmacy. Megan informed me that at this time the CCM program is not something that the patient is needing at this time.

## 2021-07-03 ENCOUNTER — Other Ambulatory Visit: Payer: Self-pay | Admitting: Family Medicine

## 2021-07-03 NOTE — Telephone Encounter (Signed)
The original prescription was discontinued on 04/08/2021 by Vivi Barrack, MD. Renewing this prescription may not be appropriate.

## 2021-08-15 DIAGNOSIS — M542 Cervicalgia: Secondary | ICD-10-CM | POA: Diagnosis not present

## 2021-08-15 DIAGNOSIS — X58XXXA Exposure to other specified factors, initial encounter: Secondary | ICD-10-CM | POA: Diagnosis not present

## 2021-08-15 DIAGNOSIS — S161XXA Strain of muscle, fascia and tendon at neck level, initial encounter: Secondary | ICD-10-CM | POA: Diagnosis not present

## 2021-08-15 DIAGNOSIS — Z87891 Personal history of nicotine dependence: Secondary | ICD-10-CM | POA: Diagnosis not present

## 2021-08-15 DIAGNOSIS — M25511 Pain in right shoulder: Secondary | ICD-10-CM | POA: Diagnosis not present

## 2021-09-13 ENCOUNTER — Telehealth: Payer: Self-pay | Admitting: Family Medicine

## 2021-09-13 NOTE — Telephone Encounter (Signed)
Req CB patient at work  ?

## 2021-09-15 ENCOUNTER — Other Ambulatory Visit: Payer: Self-pay | Admitting: Family Medicine

## 2021-12-14 ENCOUNTER — Other Ambulatory Visit: Payer: Self-pay | Admitting: Family Medicine

## 2021-12-17 ENCOUNTER — Ambulatory Visit (INDEPENDENT_AMBULATORY_CARE_PROVIDER_SITE_OTHER): Payer: Medicare Other

## 2021-12-17 DIAGNOSIS — Z0000 Encounter for general adult medical examination without abnormal findings: Secondary | ICD-10-CM

## 2021-12-17 NOTE — Progress Notes (Signed)
Virtual Visit via Telephone Note  I connected with  Tyrone [name] on 12/17/21 at  9:15 AM EDT by telephone and verified that I am speaking with the correct person using two identifiers.  Medicare Annual Wellness visit completed telephonically due to Covid-19 pandemic.   Persons participating in this call: This Health Coach and this patient.   Location: Patient: home Provider: office   I discussed the limitations, risks, security and privacy concerns of performing an evaluation and management service by telephone and the availability of in person appointments. The patient expressed understanding and agreed to proceed.  Unable to perform video visit due to video visit attempted and failed and/or patient does not have video capability.   Some vital signs may be absent or patient reported.   Willette Brace, LPN  Subjective:   Tyrone [name] is a 76 y.o. male who presents for Medicare Annual/Subsequent preventive examination.  Review of Systems     Cardiac Risk Factors include: advanced age (>73mn, >>56women);male gender     Objective:    There were no vitals filed for this visit. There is no height or weight on file to calculate BMI.     12/17/2021    9:23 AM 05/13/2014    4:37 PM  Advanced Directives  Does Patient Have a Medical Advance Directive? Yes No  Type of [place]   Copy of [place]? No - copy requested   Would patient like information on creating a medical advance directive?  No - patient declined information    Current Medications (verified) Outpatient Encounter Medications as of 12/17/2021  Medication Sig   allopurinol (ZYLOPRIM) 300 MG tablet TAKE 1 TABLET BY MOUTH DAILY   amLODipine (NORVASC) 10 MG tablet Take 0.5 tablets (5 mg total) by mouth daily.   citalopram (CELEXA) 40 MG tablet TAKE 1 TABLET BY MOUTH EVERY DAY   hydrOXYzine (ATARAX/VISTARIL) 50 MG tablet Take 1  tablet (50 mg total) by mouth 3 (three) times daily as needed for anxiety.   ibuprofen (ADVIL) 600 MG tablet Take 600 mg by mouth every 6 (six) hours as needed.   pantoprazole (PROTONIX) 40 MG tablet TAKE 1 TABLET BY MOUTH TWICE DAILY   Vitamin D, Ergocalciferol, (DRISDOL) 1.25 MG (50000 UNIT) CAPS capsule TAKE ONE CAPSULE BY MOUTH EVERY SEVEN DAYS   No facility-administered encounter medications on file as of 12/17/2021.    Allergies (verified) Patient has no known allergies.   History: Past Medical History:  Diagnosis Date   Anemia    Arthritis    hands   Blood transfusion without reported diagnosis    for stomach ulcers   Depression    Hypertension    Kidney stone    Multiple duodenal ulcers    Past Surgical History:  Procedure Laterality Date   ABDOMINAL SURGERY     ulcers   COLONOSCOPY     LITHOTRIPSY     Family History  Problem Relation Age of Onset   Colon cancer Neg Hx    Esophageal cancer Neg Hx    Rectal cancer Neg Hx    Stomach cancer Neg Hx    Social History   Socioeconomic History   Marital status: Married    Spouse name: Not on file   Number of children: Not on file   Years of education: Not on file   Highest education level: Not on file  Occupational History   Not on file  Tobacco Use   Smoking status: Former   Smokeless tobacco: Former    Types: Chew    Quit date: 05/30/2007  Substance and Sexual Activity   Alcohol use: Not Currently    Alcohol/week: 0.0 standard drinks of alcohol    Comment: sober 2.5 months   Drug use: No   Sexual activity: Not on file  Other Topics Concern   Not on file  Social History Narrative   Not on file   Social Determinants of Health   Financial Resource Strain: Low Risk  (12/17/2021)   Overall Financial Resource Strain (CARDIA)    Difficulty of Paying Living Expenses: Not hard at all  Food Insecurity: No Food Insecurity (12/17/2021)   Hunger Vital Sign    Worried About Running Out of Food in the Last Year:  Never true    [place] in the Last Year: Never true  Transportation Needs: No Transportation Needs (12/17/2021)   PRAPARE - Hydrologist (Medical): No    Lack of Transportation (Non-Medical): No  Physical Activity: Inactive (12/17/2021)   Exercise Vital Sign    Days of Exercise per Week: 0 days    Minutes of Exercise per Session: 0 min  Stress: No Stress Concern Present (12/17/2021)   [place]    Feeling of Stress : Not at all  Social Connections: Moderately Integrated (12/17/2021)   Social Connection and Isolation Panel [NHANES]    Frequency of Communication with Friends and Family: More than three times a week    Frequency of Social Gatherings with Friends and Family: More than three times a week    Attends Religious Services: More than 4 times per year    Active Member of [employer] or Organizations: No    Attends Music therapist: Never    Marital Status: Married    Tobacco Counseling Counseling given: Not Answered   Clinical Intake:  Pre-visit preparation completed: Yes  Pain : No/denies pain     BMI - recorded: 27.95 Nutritional Status: BMI 25 -29 Overweight Nutritional Risks: None Diabetes: No  How often do you need to have someone help you when you read instructions, pamphlets, or other written materials from your doctor or pharmacy?: 1 - Never  Diabetic?no  Interpreter Needed?: No  Information entered by :: Charlott Rakes, LPN   Activities of Daily Living    12/17/2021    9:24 AM  In your present state of health, do you have any difficulty performing the following activities:  Hearing? 0  Vision? 0  Difficulty concentrating or making decisions? 0  Walking or climbing stairs? 0  Dressing or bathing? 0  Doing errands, shopping? 0  Preparing Food and eating ? N  Using the Toilet? N  In the past six months, have you accidently leaked urine? N  Do you  have problems with loss of bowel control? N  Managing your Medications? N  Managing your Finances? N  Housekeeping or managing your Housekeeping? N    Patient Care Team: Vivi Barrack, MD as PCP - General (Family Medicine) Madelin Rear, [facility] as Pharmacist (Pharmacist)  Indicate any recent Medical Services you may have received from other than [facility] providers in the past year (date may be approximate).     Assessment:   This is a routine wellness examination for Tyrone [name].  Hearing/Vision screen Hearing Screening - Comments:: Pt denies any hearing issues  Vision Screening - Comments:: Pt  follows up with provider in [place]   Dietary issues and exercise activities discussed: Current Exercise Habits: The patient has a physically strenuous job, but has no regular exercise apart from work.   Goals Addressed             This Visit's Progress    Patient Stated       Continue to lose weight        Depression Screen    12/17/2021    9:22 AM 04/05/2020    9:39 AM 04/05/2020    9:11 AM 04/05/2020    9:09 AM 12/03/2018   10:18 AM 12/03/2018    9:28 AM 07/20/2018    9:11 AM  PHQ 2/9 Scores  PHQ - 2 Score 0 0 0 0 0 0 0  PHQ- 9 Score  0   2  5    Fall Risk    12/17/2021    9:24 AM 12/04/2020   10:57 AM 04/05/2020    9:36 AM 04/05/2020    9:08 AM 05/13/2018    9:35 AM  Fall Risk   Falls in the past year? 0 0 0 0 1  Number falls in past yr: 0 0 0 0 1  Injury with Fall? 0 0 0 0 1  Risk for fall due to : Impaired vision;Impaired balance/gait No Fall Risks     Follow up Falls prevention discussed        FALL RISK PREVENTION PERTAINING TO THE HOME:  Any stairs in or around the home? Yes  If so, are there any without handrails? No  Home free of loose throw rugs in walkways, pet beds, electrical cords, etc? Yes  Adequate lighting in your home to reduce risk of falls? Yes   ASSISTIVE DEVICES UTILIZED TO PREVENT FALLS:  Life alert? Yes  Use of a cane, walker or w/c? No  Grab bars  in the bathroom? Yes  Shower chair or bench in shower? Yes  Elevated toilet seat or a handicapped toilet? Yes  TIMED UP AND GO:  Was the test performed? No .   Cognitive Function:        12/17/2021    9:26 AM  6CIT Screen  What Year? 0 points  What month? 0 points  What time? 0 points  Count back from 20 0 points  Months in reverse 2 points  Repeat phrase 2 points  Total Score 4 points    Immunizations Immunization History  Administered Date(s) Administered   Fluad Quad(high Dose 65+) 06/06/2019, 04/05/2020, 03/12/2021   Hep A / Hep B 10/04/2019, 11/01/2019   Influenza, High Dose Seasonal PF 05/13/2018   Moderna Sars-Covid-2 Vaccination 11/26/2019, 12/24/2019   PNEUMOCOCCAL CONJUGATE-20 04/08/2021   Td 05/13/2018   Tdap 01/25/2020    TDAP status: Up to date  Flu Vaccine status: Up to date  Pneumococcal vaccine status: Up to date  Covid-19 vaccine status: Completed vaccines  Qualifies for Shingles Vaccine? Yes   Zostavax completed No   Shingrix Completed?: No.    Education has been provided regarding the importance of this vaccine. Patient has been advised to call insurance company to determine out of pocket expense if they have not yet received this vaccine. Advised may also receive vaccine at local pharmacy or Health Dept. Verbalized acceptance and understanding.  Screening Tests Health Maintenance  Topic Date Due   Zoster Vaccines- Shingrix (1 of 2) Never done   COVID-19 Vaccine (3 - Moderna series) 02/18/2020   INFLUENZA VACCINE  12/17/2021  COLONOSCOPY (Pts 45-30yr Insurance coverage will need to be confirmed)  09/29/2024   TETANUS/TDAP  01/24/2030   Pneumonia Vaccine 76 Years old  Completed   Hepatitis C Screening  Completed   HPV VACCINES  Aged Out    Health Maintenance  Health Maintenance Due  Topic Date Due   Zoster Vaccines- Shingrix (1 of 2) Never done   COVID-19 Vaccine (3 - Moderna series) 02/18/2020   INFLUENZA VACCINE  12/17/2021     Colorectal cancer screening: Type of screening: Colonoscopy. Completed 09/30/19. Repeat every 5  years   Additional Screening:  Hepatitis C Screening:  Completed 09/14/19  Vision Screening: Recommended annual ophthalmology exams for early detection of glaucoma and other disorders of the eye. Is the patient up to date with their annual eye exam?  Yes  Who is the provider or what is the name of the office in which the patient attends annual eye exams? [place] provider  If pt is not established with a provider, would they like to be referred to a provider to establish care? No .   Dental Screening: Recommended annual dental exams for proper oral hygiene  Community Resource Referral / Chronic Care Management: CRR required this visit?  No   CCM required this visit?  No      Plan:     I have personally reviewed and noted the following in the patient's chart:   Medical and social history Use of alcohol, tobacco or illicit drugs  Current medications and supplements including opioid prescriptions. Patient is not currently taking opioid prescriptions. Functional ability and status Nutritional status Physical activity Advanced directives List of other physicians Hospitalizations, surgeries, and ER visits in previous 12 months Vitals Screenings to include cognitive, depression, and falls Referrals and appointments  In addition, I have reviewed and discussed with patient certain preventive protocols, quality metrics, and best practice recommendations. A written personalized care plan for preventive services as well as general preventive health recommendations were provided to patient.     TWillette Brace LPN   85/01/2923  Nurse Notes: none

## 2021-12-17 NOTE — Patient Instructions (Signed)
Tyrone [name] , Thank you for taking time to come for your Medicare Wellness Visit. I appreciate your ongoing commitment to your health goals. Please review the following plan we discussed and let me know if I can assist you in the future.   Screening recommendations/referrals: Colonoscopy: done 09/30/19 repeat every 5 years  Recommended yearly ophthalmology/optometry visit for glaucoma screening and checkup Recommended yearly dental visit for hygiene and checkup  Vaccinations: Influenza vaccine: done 03/12/21 repeat every year  Pneumococcal vaccine: Up to date Tdap vaccine: done 01/25/20 repeat every 10 years  Shingles vaccine: Shingrix discussed. Please contact your pharmacy for coverage information.    Covid-19: completed 7/10, 12/24/19  Advanced directives: Please bring a copy of your health care power of attorney and living will to the office at your convenience.  Conditions/risks identified: continue to lose weight   Next appointment: Follow up in one year for your annual wellness visit.   Preventive Care 38 Years and Older, Male Preventive care refers to lifestyle choices and visits with your health care provider that can promote health and wellness. What does preventive care include? A yearly physical exam. This is also called an annual well check. Dental exams once or twice a year. Routine eye exams. Ask your health care provider how often you should have your eyes checked. Personal lifestyle choices, including: Daily care of your teeth and gums. Regular physical activity. Eating a healthy diet. Avoiding tobacco and drug use. Limiting alcohol use. Practicing safe sex. Taking low doses of aspirin every day. Taking vitamin and mineral supplements as recommended by your health care provider. What happens during an annual well check? The services and screenings done by your health care provider during your annual well check will depend on your age, overall health, lifestyle risk  factors, and family history of disease. Counseling  Your health care provider may ask you questions about your: Alcohol use. Tobacco use. Drug use. Emotional well-being. Home and relationship well-being. Sexual activity. Eating habits. History of falls. Memory and ability to understand (cognition). Work and work Statistician. Screening  You may have the following tests or measurements: Height, weight, and BMI. Blood pressure. Lipid and cholesterol levels. These may be checked every 5 years, or more frequently if you are over 82 years old. Skin check. Lung cancer screening. You may have this screening every year starting at age 46 if you have a 30-pack-year history of smoking and currently smoke or have quit within the past 15 years. Fecal occult blood test (FOBT) of the stool. You may have this test every year starting at age 63. Flexible sigmoidoscopy or colonoscopy. You may have a sigmoidoscopy every 5 years or a colonoscopy every 10 years starting at age 36. Prostate cancer screening. Recommendations will vary depending on your family history and other risks. Hepatitis C blood test. Hepatitis B blood test. Sexually transmitted disease (STD) testing. Diabetes screening. This is done by checking your blood sugar (glucose) after you have not eaten for a while (fasting). You may have this done every 1-3 years. Abdominal aortic aneurysm (AAA) screening. You may need this if you are a current or former smoker. Osteoporosis. You may be screened starting at age 7 if you are at high risk. Talk with your health care provider about your test results, treatment options, and if necessary, the need for more tests. Vaccines  Your health care provider may recommend certain vaccines, such as: Influenza vaccine. This is recommended every year. Tetanus, diphtheria, and acellular pertussis (Tdap, Td) vaccine.  You may need a Td booster every 10 years. Zoster vaccine. You may need this after age  9. Pneumococcal 13-valent conjugate (PCV13) vaccine. One dose is recommended after age 39. Pneumococcal polysaccharide (PPSV23) vaccine. One dose is recommended after age 40. Talk to your health care provider about which screenings and vaccines you need and how often you need them. This information is not intended to replace advice given to you by your health care provider. Make sure you discuss any questions you have with your health care provider. Document Released: 06/01/2015 Document Revised: 01/23/2016 Document Reviewed: 03/06/2015 Elsevier Interactive Patient Education  2017 [place] Prevention in the Home Falls can cause injuries. They can happen to people of all ages. There are many things you can do to make your home safe and to help prevent falls. What can I do on the outside of my home? Regularly fix the edges of walkways and driveways and fix any cracks. Remove anything that might make you trip as you walk through a door, such as a raised step or threshold. Trim any bushes or trees on the path to your home. Use bright outdoor lighting. Clear any walking paths of anything that might make someone trip, such as rocks or tools. Regularly check to see if handrails are loose or broken. Make sure that both sides of any steps have handrails. Any raised decks and porches should have guardrails on the edges. Have any leaves, snow, or ice cleared regularly. Use sand or salt on walking paths during winter. Clean up any spills in your garage right away. This includes oil or grease spills. What can I do in the bathroom? Use night lights. Install grab bars by the toilet and in the tub and shower. Do not use towel bars as grab bars. Use non-skid mats or decals in the tub or shower. If you need to sit down in the shower, use a plastic, non-slip stool. Keep the floor dry. Clean up any water that spills on the floor as soon as it happens. Remove soap buildup in the tub or shower  regularly. Attach bath mats securely with double-sided non-slip rug tape. Do not have throw rugs and other things on the floor that can make you trip. What can I do in the bedroom? Use night lights. Make sure that you have a light by your bed that is easy to reach. Do not use any sheets or blankets that are too big for your bed. They should not hang down onto the floor. Have a firm chair that has side arms. You can use this for support while you get dressed. Do not have throw rugs and other things on the floor that can make you trip. What can I do in the kitchen? Clean up any spills right away. Avoid walking on wet floors. Keep items that you use a lot in easy-to-reach places. If you need to reach something above you, use a strong step stool that has a grab bar. Keep electrical cords out of the way. Do not use floor polish or wax that makes floors slippery. If you must use wax, use non-skid floor wax. Do not have throw rugs and other things on the floor that can make you trip. What can I do with my stairs? Do not leave any items on the stairs. Make sure that there are handrails on both sides of the stairs and use them. Fix handrails that are broken or loose. Make sure that handrails are as long as  the stairways. Check any carpeting to make sure that it is firmly attached to the stairs. Fix any carpet that is loose or worn. Avoid having throw rugs at the top or bottom of the stairs. If you do have throw rugs, attach them to the floor with carpet tape. Make sure that you have a light switch at the top of the stairs and the bottom of the stairs. If you do not have them, ask someone to add them for you. What else can I do to help prevent falls? Wear shoes that: Do not have high heels. Have rubber bottoms. Are comfortable and fit you well. Are closed at the toe. Do not wear sandals. If you use a stepladder: Make sure that it is fully opened. Do not climb a closed stepladder. Make sure that  both sides of the stepladder are locked into place. Ask someone to hold it for you, if possible. Clearly mark and make sure that you can see: Any grab bars or handrails. First and last steps. Where the edge of each step is. Use tools that help you move around (mobility aids) if they are needed. These include: Canes. Walkers. Scooters. Crutches. Turn on the lights when you go into a dark area. Replace any light bulbs as soon as they burn out. Set up your furniture so you have a clear path. Avoid moving your furniture around. If any of your floors are uneven, fix them. If there are any pets around you, be aware of where they are. Review your medicines with your doctor. Some medicines can make you feel dizzy. This can increase your chance of falling. Ask your doctor what other things that you can do to help prevent falls. This information is not intended to replace advice given to you by your health care provider. Make sure you discuss any questions you have with your health care provider. Document Released: 03/01/2009 Document Revised: 10/11/2015 Document Reviewed: 06/09/2014 Elsevier Interactive Patient Education  2017 [employer].

## 2022-03-13 ENCOUNTER — Other Ambulatory Visit: Payer: Self-pay | Admitting: Family Medicine

## 2022-04-07 ENCOUNTER — Telehealth: Payer: Self-pay | Admitting: Family Medicine

## 2022-04-07 NOTE — Telephone Encounter (Signed)
Caller states: -pt will see PCP on 04/09/22. -patient is describing "seeing things". -Would like to speak to PCP team because he cannot attend the patient's next appt.    Caller Requests: -Call back, before and/or after 04/09/22 appointment to discuss patient's recent developments.

## 2022-04-08 NOTE — Telephone Encounter (Signed)
Noted. We can discuss at his upcoming appointment.  Tyrone [name]. [name], MD 04/08/2022 10:27 AM

## 2022-04-08 NOTE — Telephone Encounter (Signed)
Spoke with patient son, stated patient seen things, "people coming to his house" Has Hx of depression, Sx been going on for a year.  Has OV tomorrow with PCP, son unable to come with him. Want PCP to know what's going on  Unsure if patient is taking medication as prescribe or mixing them.

## 2022-04-09 ENCOUNTER — Ambulatory Visit: Payer: Medicare Other | Admitting: Family Medicine

## 2022-04-09 ENCOUNTER — Encounter: Payer: Self-pay | Admitting: Family Medicine

## 2022-04-09 ENCOUNTER — Ambulatory Visit (INDEPENDENT_AMBULATORY_CARE_PROVIDER_SITE_OTHER): Payer: Medicare Other | Admitting: Family Medicine

## 2022-04-09 VITALS — BP 144/80 | HR 77 | Temp 97.5°F | Ht 69.0 in | Wt 175.6 lb

## 2022-04-09 DIAGNOSIS — K219 Gastro-esophageal reflux disease without esophagitis: Secondary | ICD-10-CM

## 2022-04-09 DIAGNOSIS — E559 Vitamin D deficiency, unspecified: Secondary | ICD-10-CM

## 2022-04-09 DIAGNOSIS — E0590 Thyrotoxicosis, unspecified without thyrotoxic crisis or storm: Secondary | ICD-10-CM

## 2022-04-09 DIAGNOSIS — R5383 Other fatigue: Secondary | ICD-10-CM

## 2022-04-09 DIAGNOSIS — L299 Pruritus, unspecified: Secondary | ICD-10-CM

## 2022-04-09 DIAGNOSIS — Z23 Encounter for immunization: Secondary | ICD-10-CM

## 2022-04-09 DIAGNOSIS — I10 Essential (primary) hypertension: Secondary | ICD-10-CM | POA: Diagnosis not present

## 2022-04-09 DIAGNOSIS — K7581 Nonalcoholic steatohepatitis (NASH): Secondary | ICD-10-CM | POA: Diagnosis not present

## 2022-04-09 DIAGNOSIS — D509 Iron deficiency anemia, unspecified: Secondary | ICD-10-CM | POA: Diagnosis not present

## 2022-04-09 DIAGNOSIS — R739 Hyperglycemia, unspecified: Secondary | ICD-10-CM

## 2022-04-09 DIAGNOSIS — E538 Deficiency of other specified B group vitamins: Secondary | ICD-10-CM | POA: Diagnosis not present

## 2022-04-09 DIAGNOSIS — D692 Other nonthrombocytopenic purpura: Secondary | ICD-10-CM

## 2022-04-09 DIAGNOSIS — F419 Anxiety disorder, unspecified: Secondary | ICD-10-CM

## 2022-04-09 DIAGNOSIS — M109 Gout, unspecified: Secondary | ICD-10-CM | POA: Diagnosis not present

## 2022-04-09 DIAGNOSIS — Z79899 Other long term (current) drug therapy: Secondary | ICD-10-CM | POA: Diagnosis not present

## 2022-04-09 DIAGNOSIS — F1010 Alcohol abuse, uncomplicated: Secondary | ICD-10-CM

## 2022-04-09 LAB — COMPREHENSIVE METABOLIC PANEL
ALT: 9 U/L (ref 0–53)
AST: 15 U/L (ref 0–37)
Albumin: 4.5 g/dL (ref 3.5–5.2)
Alkaline Phosphatase: 87 U/L (ref 39–117)
BUN: 12 mg/dL (ref 6–23)
CO2: 30 mEq/L (ref 19–32)
Calcium: 9 mg/dL (ref 8.4–10.5)
Chloride: 98 mEq/L (ref 96–112)
Creatinine, Ser: 0.85 mg/dL (ref 0.40–1.50)
GFR: 84.35 mL/min (ref >60.00)
Glucose, Bld: 105 mg/dL — ABNORMAL HIGH (ref 70–99)
Potassium: 3.7 mEq/L (ref 3.5–5.1)
Sodium: 139 mEq/L (ref 135–145)
Total Bilirubin: 1.2 mg/dL (ref 0.2–1.2)
Total Protein: 7.4 g/dL (ref 6.0–8.3)

## 2022-04-09 LAB — LIPID PANEL
Cholesterol: 204 mg/dL — ABNORMAL HIGH (ref 0–200)
HDL: 77.2 mg/dL (ref >39.00)
LDL Cholesterol: 104 mg/dL — ABNORMAL HIGH (ref 0–99)
NonHDL: 126.35
Total CHOL/HDL Ratio: 3
Triglycerides: 111 mg/dL (ref 0.0–149.0)
VLDL: 22.2 mg/dL (ref 0.0–40.0)

## 2022-04-09 LAB — IBC + FERRITIN
Ferritin: 50.3 ng/mL (ref 22.0–322.0)
Iron: 62 ug/dL (ref 42–165)
Saturation Ratios: 16.9 % — ABNORMAL LOW (ref 20.0–50.0)
TIBC: 366.8 ug/dL (ref 250.0–450.0)
Transferrin: 262 mg/dL (ref 212.0–360.0)

## 2022-04-09 LAB — URIC ACID: Uric Acid, Serum: 4.6 mg/dL (ref 4.0–7.8)

## 2022-04-09 LAB — CBC
HCT: 41.8 % (ref 39.0–52.0)
Hemoglobin: 13.8 g/dL (ref 13.0–17.0)
MCHC: 33 g/dL (ref 30.0–36.0)
MCV: 100.6 fl — ABNORMAL HIGH (ref 78.0–100.0)
Platelets: 244 10*3/uL (ref 150.0–400.0)
RBC: 4.16 Mil/uL — ABNORMAL LOW (ref 4.22–5.81)
RDW: 16 % — ABNORMAL HIGH (ref 11.5–15.5)
WBC: 7.3 10*3/uL (ref 4.0–10.5)

## 2022-04-09 LAB — FOLATE: Folate: 15.5 ng/mL (ref >5.9)

## 2022-04-09 LAB — TSH: TSH: 0.4 u[IU]/mL (ref 0.35–5.50)

## 2022-04-09 LAB — VITAMIN D 25 HYDROXY (VIT D DEFICIENCY, FRACTURES): VITD: 24.74 ng/mL — ABNORMAL LOW (ref 30.00–100.00)

## 2022-04-09 LAB — VITAMIN B12: Vitamin B-12: 640 pg/mL (ref 211–911)

## 2022-04-09 LAB — HEMOGLOBIN A1C: Hgb A1c MFr Bld: 5.3 % (ref 4.6–6.5)

## 2022-04-09 NOTE — Assessment & Plan Note (Signed)
Blood pressure at goal per JNC 8.  Is no longer on amlodipine.  We did discuss restarting however deferred for today.  He will continue to monitor at home and let us know if persistently elevated to 140/90 or higher.

## 2022-04-09 NOTE — Assessment & Plan Note (Signed)
Check B12.  Borderline low last year.  Could be contributing some to his fatigue issues.

## 2022-04-09 NOTE — Assessment & Plan Note (Signed)
Multifactorial.  He did have low vitamin D and borderline low B12 as well as iron deficiency anemia and borderline TSH last year.  We will recheck labs today.

## 2022-04-09 NOTE — Assessment & Plan Note (Signed)
Patient has newly eliminated alcohol consumption.  Congratulated patient on his cessation and encouraged continued abstinence.

## 2022-04-09 NOTE — Assessment & Plan Note (Signed)
Stable off Protonix 40 mg daily.

## 2022-04-09 NOTE — Progress Notes (Signed)
Tyrone [name] is a 76 y.o. male who presents today for an office visit.  Assessment/Plan:  Chronic Problems Addressed Today: Anxiety Had lengthy discussion with patient and his wife regarding his anxiety symptoms.  Patient anxiety symptoms are not controlled.  We did discuss adding additional medication to help manage his symptoms including going back to hydroxyzine however given that he is having some issues with confusion and hallucinations with Benadryl we will not pursue this further.  We did discuss adding on gabapentin as well however they deferred.  His wife would like for him to stop all medications due to concern that these may be causing significant side effects.  I did discuss that this was not recommended given that his symptoms are not controlled at this point however if they do wish to do this at I would recommend he decrease Celexa to 20 mg every day for a few weeks and then discontinue to prevent discontinuation symptoms.  I asked him to follow-up with me in a few weeks.  Depending on response may need to restart on Celexa or other SSRI.  At some point the future may consider adding on low-dose gabapentin which hopefully should help some with his pruritus as well.  We will need to avoid anticholinergics in the future given his previous bad reactions including confusion and hallucinations.  Gout We did discuss stopping his allopurinol per his wife's request however he would like to continue for now.  He has not had any recent flares.  We will continue 300 mg daily.  Iron deficiency anemia He is currently on ferrous sulfate.  This could be causing some upset stomach and GI side effects.  We will check iron panel today.  He can try switching to ferrous gluconate.   Subclinical hyperthyroidism Check TSH today.  B12 deficiency Check B12.  Borderline low last year.  Could be contributing some to his fatigue issues.  HTN (hypertension) Blood pressure at goal per [facility] 8.  Is no  longer on amlodipine.  We did discuss restarting however deferred for today.  He will continue to monitor at home and let us know if persistently elevated to 140/90 or higher.  GERD (gastroesophageal reflux disease) Stable off Protonix 40 mg daily.  Hyperglycemia Check A1c.  Steatohepatitis Follows with GI for this.  He has cut out alcohol use.  Congratulated patient on cessation.  Advised him to follow-up with GI soon.  Is not seen them for a couple of years.  Alcohol abuse Patient has newly eliminated alcohol consumption.  Congratulated patient on his cessation and encouraged continued abstinence.  Fatigue Multifactorial.  He did have low vitamin D and borderline low B12 as well as iron deficiency anemia and borderline TSH last year.  We will recheck labs today.  Pruritus Symptoms are still not controlled.He has seen dermatology in the past who did not have any other recommendations.  He has been having some confusion after taking Benadryl.  Advised him to stop all antihistamines going forward including Benadryl.  We did discuss adding on gabapentin as above however they would like to stop any medications today.  We can readdress at some point in the future.  May consider addingOn low-dose gabapentin.  Flu shot given today.     Subjective:  HPI:  See A/P for status of chronic conditions.  Patient was seen here last about a year ago.  He is here today with his wife.    He ran out of a few of his medications  since her last visit and has not been taking his amlodipine or Protonix.  Feels like his reflux symptoms are fairly manageable at this point.  His blood pressure readings at home have usually been in the 130-140/70-80 range.  Primary concern today is nausea when taking his medications.  This happens almost every night when he takes his medications.  Symptoms subside afterwards.  No vomiting.  He has also had some issue with more confusion and hallucinations.  This has been going  on for the last couple of months.  His wife notes this only happens after he takes Benadryl.  He had something similar happen a couple of years ago that subsided after stopping the Benadryl.  He will usually see things that other people cannot see.  Patient does report that he sees these things but is sometimes is not sure if these are real or hallucinations.  Overall he has been more anxious lately.  He has had some depressed mood as well.  No reported SI or HI.  He has cut out drinking and is only drinking occasional beer here and there.  There is no longer drinking daily.       Objective:  Physical Exam: BP (!) 144/80 (BP Location: Left Arm, Patient Position: Sitting, Cuff Size: Normal)   Pulse 77   Temp (!) 97.5 F (36.4 C) (Other (Comment))   Ht '5\' 9"'$  (1.753 m)   Wt 175 lb 9.6 oz (79.7 kg)   SpO2 97%   BMI 25.93 kg/m   Gen: No acute distress, resting comfortably CV: Regular rate and rhythm with no murmurs appreciated Pulm: Normal work of breathing, clear to auscultation bilaterally with no crackles, wheezes, or rhonchi Neuro: Grossly normal, moves all extremities Psych: Normal affect and thought content  Time Spent: 45 minutes of total time was spent on the date of the encounter performing the following actions: chart review prior to seeing the patient, obtaining history, performing a medically necessary exam, counseling on the treatment plan including discontinuing medications, placing orders, and documenting in our EHR.        [name]. [name], MD 04/09/2022 10:12 AM

## 2022-04-09 NOTE — Assessment & Plan Note (Signed)
Check A1c. 

## 2022-04-09 NOTE — Assessment & Plan Note (Signed)
Symptoms are still not controlled.He has seen dermatology in the past who did not have any other recommendations.  He has been having some confusion after taking Benadryl.  Advised him to stop all antihistamines going forward including Benadryl.  We did discuss adding on gabapentin as above however they would like to stop any medications today.  We can readdress at some point in the future.  May consider addingOn low-dose gabapentin.

## 2022-04-09 NOTE — Assessment & Plan Note (Signed)
Follows with GI for this.  He has cut out alcohol use.  Congratulated patient on cessation.  Advised him to follow-up with GI soon.  Is not seen them for a couple of years.

## 2022-04-09 NOTE — Assessment & Plan Note (Signed)
Check TSH today

## 2022-04-09 NOTE — Patient Instructions (Signed)
It was very nice to see you today!  It is okay for you to stay off blood pressure medication.  Please keep an eye on your blood pressure at home and let us know if it is persistently elevated.  Please stop your iron supplement for a few weeks to see if this helps with the nausea with your medications.  You can continue the allopurinol.  If you would like to stop the Celexa please decrease the dose to 20 mg daily for the next 1 to 2 weeks and then discontinue.  Please continue to work on diet and exercise.  Please do not take any other Benadryl going forward.  Please follow-up with me in a few weeks.  We will see back in year for your next annual physical with labs.  Take care, Dr [name]  PLEASE NOTE:  If you had any lab tests please let us know if you have not heard back within a few days. You may see your results on mychart before we have a chance to review them but we will give you a call once they are reviewed by [place]. If we ordered any referrals today, please let us know if you have not heard from their office within the next week.   Please try these tips to maintain a healthy lifestyle:  Eat at least 3 REAL meals and 1-2 snacks per day.  Aim for no more than 5 hours between eating.  If you eat breakfast, please do so within one hour of getting up.   Each meal should contain half fruits/vegetables, one quarter protein, and one quarter carbs (no bigger than a computer mouse)  Cut down on sweet beverages. This includes juice, soda, and sweet tea.   Drink at least 1 glass of water with each meal and aim for at least 8 glasses per day  Exercise at least 150 minutes every week.

## 2022-04-09 NOTE — Assessment & Plan Note (Signed)
He is currently on ferrous sulfate.  This could be causing some upset stomach and GI side effects.  We will check iron panel today.  He can try switching to ferrous gluconate.

## 2022-04-09 NOTE — Assessment & Plan Note (Signed)
Had lengthy discussion with patient and his wife regarding his anxiety symptoms.  Patient anxiety symptoms are not controlled.  We did discuss adding additional medication to help manage his symptoms including going back to hydroxyzine however given that he is having some issues with confusion and hallucinations with Benadryl we will not pursue this further.  We did discuss adding on gabapentin as well however they deferred.  His wife would like for him to stop all medications due to concern that these may be causing significant side effects.  I did discuss that this was not recommended given that his symptoms are not controlled at this point however if they do wish to do this at I would recommend he decrease Celexa to 20 mg every day for a few weeks and then discontinue to prevent discontinuation symptoms.  I asked him to follow-up with me in a few weeks.  Depending on response may need to restart on Celexa or other SSRI.  At some point the future may consider adding on low-dose gabapentin which hopefully should help some with his pruritus as well.  We will need to avoid anticholinergics in the future given his previous bad reactions including confusion and hallucinations.

## 2022-04-09 NOTE — Assessment & Plan Note (Signed)
We did discuss stopping his allopurinol per his wife's request however he would like to continue for now.  He has not had any recent flares.  We will continue 300 mg daily.

## 2022-04-13 LAB — VITAMIN B1: Vitamin B1 (Thiamine): <6 nmol/L — ABNORMAL LOW (ref 8–30)

## 2022-04-16 ENCOUNTER — Other Ambulatory Visit: Payer: Self-pay | Admitting: Family Medicine

## 2022-04-16 DIAGNOSIS — E785 Hyperlipidemia, unspecified: Secondary | ICD-10-CM | POA: Insufficient documentation

## 2022-04-16 DIAGNOSIS — E559 Vitamin D deficiency, unspecified: Secondary | ICD-10-CM | POA: Insufficient documentation

## 2022-04-16 DIAGNOSIS — E519 Thiamine deficiency, unspecified: Secondary | ICD-10-CM

## 2022-04-16 MED ORDER — THIAMINE HCL 100 MG PO TABS: 100.0000 mg | ORAL_TABLET | Freq: Every day | ORAL | 3 refills | Status: DC

## 2022-04-16 NOTE — Progress Notes (Signed)
Please inform patient of the following:  His thiamine is very low.  We need to have this build back up.  This could explain some of his symptoms.  I will send in a prescription however he can start over-the-counter supplementation 50 to 100 mg daily.  We should recheck this again in 3 to 6 months.  Vitamin D is a little bit low.  Recommend starting supplementation 1000 IUs daily.  We can recheck this again in 3 to 6 months.  Cholesterol is up just a little bit.  He would probably benefit from starting a cholesterol medication to improve his numbers and low risk of heart attack and stroke.  Please send in Lipitor 20 mg daily if he is willing to start.  Regardless he should continue to work on diet and exercise and we can recheck this in a year.

## 2022-04-30 ENCOUNTER — Other Ambulatory Visit: Payer: Self-pay | Admitting: *Deleted

## 2022-04-30 MED ORDER — ATORVASTATIN CALCIUM 20 MG PO TABS: 20.0000 mg | ORAL_TABLET | Freq: Every day | ORAL | 3 refills | Status: DC

## 2022-06-18 DIAGNOSIS — D3132 Benign neoplasm of left choroid: Secondary | ICD-10-CM | POA: Diagnosis not present

## 2022-06-18 DIAGNOSIS — H04123 Dry eye syndrome of bilateral lacrimal glands: Secondary | ICD-10-CM | POA: Diagnosis not present

## 2022-06-18 DIAGNOSIS — H2513 Age-related nuclear cataract, bilateral: Secondary | ICD-10-CM | POA: Diagnosis not present

## 2022-07-08 DIAGNOSIS — H2513 Age-related nuclear cataract, bilateral: Secondary | ICD-10-CM | POA: Diagnosis not present

## 2022-07-08 DIAGNOSIS — D3132 Benign neoplasm of left choroid: Secondary | ICD-10-CM | POA: Diagnosis not present

## 2022-07-08 DIAGNOSIS — H04123 Dry eye syndrome of bilateral lacrimal glands: Secondary | ICD-10-CM | POA: Diagnosis not present

## 2022-07-17 ENCOUNTER — Other Ambulatory Visit: Payer: Self-pay

## 2022-07-17 ENCOUNTER — Encounter (HOSPITAL_COMMUNITY): Payer: Self-pay

## 2022-07-18 ENCOUNTER — Encounter (HOSPITAL_COMMUNITY)
Admission: RE | Admit: 2022-07-18 | Discharge: 2022-07-18 | Disposition: A | Discharge status: Home / Self Care | Payer: Medicare Other | Source: Ambulatory Visit | Attending: Optometry | Admitting: Optometry

## 2022-07-18 DIAGNOSIS — H2511 Age-related nuclear cataract, right eye: Secondary | ICD-10-CM | POA: Diagnosis not present

## 2022-07-18 HISTORY — DX: Personal history of urinary calculi: Z87.442

## 2022-07-18 HISTORY — DX: Gout, unspecified: M10.9

## 2022-07-18 NOTE — H&P (Signed)
Surgical History & Physical  Patient Name: Tyrone [name]  DOB: 03/09/1946  Surgery: Cataract extraction with intraocular lens implant phacoemulsification; Right Eye Surgeon: Ronn Melena MD Surgery Date: 07/25/2022 Pre-Op Date: 06/18/2022  HPI: A 62 Yr. old male patient presents for a cataract evaluation. Patient reports that he has difficulty with reading small print. Patient states that he has trouble seeing captioning on the television. Patient states that his vision is blurred and has difficulty at night driving due to the glare he has from headlights. Patient broke his glasses, patient typically wears contacts. OS: Near, OD: Distance  Medical History: Cataracts Retinal drusen OU Anxiety, GOUT High Blood Pressure  Review of Systems Cardiovascular High Blood Pressure Psychiatry Anxiety All recorded systems are negative except as noted above.  Social Never smoked  Medication Allopurinol, Amlodipine besylate  Sx/Procedures None  Drug Allergies  NKDA  History & Physical: Heent: cataracts NECK: supple without bruits LUNGS: lungs clear to auscultation CV: regular rate and rhythm Abdomen: soft and non-tender  Impression & Plan:  Assessment: 1.  Hyperopia ; Both Eyes (H52.03) 2.  CATARACT AGE-RELATED NUCLEAR; Both Eyes (H25.13) 3.  DRY EYE SYNDROME/TEAR FILM INSUFFICIENCY; Both Eyes (H04.123) 4.  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  Prefers monovision with contact lenses, OD set for distanct  2.  Cataracts are visually significant and account for the patient's complaints. Discussed all risks, benefits, procedures and recovery, including infection, loss of vision and eye, need for glasses after surgery or additional procedures. Patient understands changing glasses will not improve vision. Patient indicated understanding of procedure. All questions answered. Patient desires to have surgery, recommend phacoemulsification with intraocular lens.  Patient to have preliminary testing necessary (Argos/IOL Master, Mac OCT, TOPO) Educational materials provided. Plan: - proceed with surgery OD followed by OS - will set OD for best distance and OS for -2.00 target (like he does in contacts) - ok with lying flat, no hx of flomax use, no fuchs, no DM - Omidria and combo drop  - likely RayOne lens, will repeat IOL measurements in 2 weeks to confirm  3.  New. Findings, prognosis and treatment options reviewed. Begin basic treatment regimen; warm compresses, lid scrubs, 1000-'2000mg'$  omega 3 fatty acids, and preserved artificial tears QID.  4.  No concerning features. Monitor with yearly dilation.

## 2022-07-25 ENCOUNTER — Ambulatory Visit (HOSPITAL_COMMUNITY): Payer: Medicare Other | Admitting: Certified Registered"

## 2022-07-25 ENCOUNTER — Encounter (HOSPITAL_COMMUNITY): Payer: Self-pay | Admitting: Optometry

## 2022-07-25 ENCOUNTER — Ambulatory Visit (HOSPITAL_COMMUNITY)
Admission: RE | Admit: 2022-07-25 | Discharge: 2022-07-25 | Disposition: A | Discharge status: Home / Self Care | Payer: Medicare Other | Attending: Optometry | Admitting: Optometry

## 2022-07-25 ENCOUNTER — Ambulatory Visit (HOSPITAL_BASED_OUTPATIENT_CLINIC_OR_DEPARTMENT_OTHER): Payer: Medicare Other | Admitting: Certified Registered"

## 2022-07-25 ENCOUNTER — Encounter (HOSPITAL_COMMUNITY)
Admission: RE | Disposition: A | Discharge status: Home / Self Care | Payer: Self-pay | Source: Home / Self Care | Attending: Optometry

## 2022-07-25 DIAGNOSIS — I10 Essential (primary) hypertension: Secondary | ICD-10-CM | POA: Diagnosis not present

## 2022-07-25 DIAGNOSIS — H2511 Age-related nuclear cataract, right eye: Secondary | ICD-10-CM | POA: Diagnosis not present

## 2022-07-25 DIAGNOSIS — M109 Gout, unspecified: Secondary | ICD-10-CM | POA: Diagnosis not present

## 2022-07-25 DIAGNOSIS — Z87891 Personal history of nicotine dependence: Secondary | ICD-10-CM

## 2022-07-25 DIAGNOSIS — D3132 Benign neoplasm of left choroid: Secondary | ICD-10-CM | POA: Diagnosis not present

## 2022-07-25 DIAGNOSIS — H5203 Hypermetropia, bilateral: Secondary | ICD-10-CM | POA: Insufficient documentation

## 2022-07-25 DIAGNOSIS — F418 Other specified anxiety disorders: Secondary | ICD-10-CM

## 2022-07-25 DIAGNOSIS — E0590 Thyrotoxicosis, unspecified without thyrotoxic crisis or storm: Secondary | ICD-10-CM | POA: Diagnosis not present

## 2022-07-25 DIAGNOSIS — H04123 Dry eye syndrome of bilateral lacrimal glands: Secondary | ICD-10-CM | POA: Diagnosis not present

## 2022-07-25 HISTORY — PX: CATARACT EXTRACTION W/PHACO: SHX586

## 2022-07-25 SURGERY — PHACOEMULSIFICATION, CATARACT, WITH IOL INSERTION
Anesthesia: Monitor Anesthesia Care | Site: Eye | Laterality: Right

## 2022-07-25 MED ORDER — NEOMYCIN-POLYMYXIN-DEXAMETH 3.5-10000-0.1 OP SUSP
OPHTHALMIC | Status: DC | PRN
  Administered 2022-07-25: 2 [drp] via OPHTHALMIC

## 2022-07-25 MED ORDER — MIDAZOLAM HCL 2 MG/2ML IJ SOLN
INTRAMUSCULAR | Status: AC
  Filled 2022-07-25: qty 2

## 2022-07-25 MED ORDER — PHENYLEPHRINE HCL 2.5 % OP SOLN
1.0000 [drp] | OPHTHALMIC | Status: AC
  Administered 2022-07-25 (×3): 1 [drp] via OPHTHALMIC

## 2022-07-25 MED ORDER — LIDOCAINE HCL (PF) 1 % IJ SOLN
INTRAMUSCULAR | Status: DC | PRN
  Administered 2022-07-25: 1 mL

## 2022-07-25 MED ORDER — SODIUM CHLORIDE 0.9% FLUSH
INTRAVENOUS | Status: DC | PRN
  Administered 2022-07-25: 5 mL via INTRAVENOUS

## 2022-07-25 MED ORDER — TETRACAINE HCL 0.5 % OP SOLN
1.0000 [drp] | OPHTHALMIC | Status: AC
  Administered 2022-07-25 (×3): 1 [drp] via OPHTHALMIC

## 2022-07-25 MED ORDER — LACTATED RINGERS IV SOLN: INTRAVENOUS | Status: DC

## 2022-07-25 MED ORDER — TROPICAMIDE 1 % OP SOLN
1.0000 [drp] | OPHTHALMIC | Status: AC
  Administered 2022-07-25 (×3): 1 [drp] via OPHTHALMIC

## 2022-07-25 MED ORDER — STERILE WATER FOR IRRIGATION IR SOLN
Status: DC | PRN
  Administered 2022-07-25: 250 mL

## 2022-07-25 MED ORDER — POVIDONE-IODINE 5 % OP SOLN
OPHTHALMIC | Status: DC | PRN
  Administered 2022-07-25: 1 via OPHTHALMIC

## 2022-07-25 MED ORDER — FENTANYL CITRATE (PF) 100 MCG/2ML IJ SOLN
INTRAMUSCULAR | Status: AC
  Filled 2022-07-25: qty 2

## 2022-07-25 MED ORDER — FENTANYL CITRATE (PF) 100 MCG/2ML IJ SOLN
INTRAMUSCULAR | Status: DC | PRN
  Administered 2022-07-25: 50 ug via INTRAVENOUS

## 2022-07-25 MED ORDER — MIDAZOLAM HCL 2 MG/2ML IJ SOLN
INTRAMUSCULAR | Status: DC | PRN
  Administered 2022-07-25: 1 mg via INTRAVENOUS

## 2022-07-25 MED ORDER — BSS IO SOLN
INTRAOCULAR | Status: DC | PRN
  Administered 2022-07-25: 15 mL via INTRAOCULAR

## 2022-07-25 MED ORDER — PHENYLEPHRINE-KETOROLAC 1-0.3 % IO SOLN
INTRAOCULAR | Status: DC | PRN
  Administered 2022-07-25: 500 mL via OPHTHALMIC

## 2022-07-25 MED ORDER — SIGHTPATH DOSE#1 NA HYALUR & NA CHOND-NA HYALUR IO KIT
PACK | INTRAOCULAR | Status: DC | PRN
  Administered 2022-07-25: 1 via OPHTHALMIC

## 2022-07-25 MED ORDER — LIDOCAINE HCL 3.5 % OP GEL
1.0000 | Freq: Once | OPHTHALMIC | Status: AC
  Administered 2022-07-25: 1 via OPHTHALMIC

## 2022-07-25 SURGICAL SUPPLY — 16 items
CATARACT SUITE SIGHTPATH (MISCELLANEOUS) ×1 IMPLANT
CLOTH BEACON ORANGE TIMEOUT ST (SAFETY) ×2 IMPLANT
DRSG TEGADERM 4X4.75 (GAUZE/BANDAGES/DRESSINGS) ×2 IMPLANT
EYE SHIELD UNIVERSAL CLEAR (GAUZE/BANDAGES/DRESSINGS) IMPLANT
FEE CATARACT SUITE SIGHTPATH (MISCELLANEOUS) ×2 IMPLANT
GLOVE BIOGEL PI IND STRL 7.0 (GLOVE) ×4 IMPLANT
LENS IOL RAYNER 22.5 (Intraocular Lens) ×1 IMPLANT
LENS IOL RAYONE EMV 22.5 (Intraocular Lens) IMPLANT
NDL HYPO 18GX1.5 BLUNT FILL (NEEDLE) ×2 IMPLANT
NEEDLE HYPO 18GX1.5 BLUNT FILL (NEEDLE) ×1 IMPLANT
PAD ARMBOARD 7.5X6 YLW CONV (MISCELLANEOUS) ×2 IMPLANT
RING MALYGIN 7.0 (MISCELLANEOUS) IMPLANT
SYR TB 1ML LL NO SAFETY (SYRINGE) ×2 IMPLANT
TAPE SURG TRANSPORE 1 IN (GAUZE/BANDAGES/DRESSINGS) IMPLANT
TAPE SURGICAL TRANSPORE 1 IN (GAUZE/BANDAGES/DRESSINGS) ×1
WATER STERILE IRR 250ML POUR (IV SOLUTION) ×2 IMPLANT

## 2022-07-25 NOTE — Op Note (Signed)
Date of procedure: 07/25/22  Pre-operative diagnosis: Visually significant age-related nuclear cataract, Right Eye (H25.11)  Post-operative diagnosis: Visually significant age-related nuclear cataract, Right Eye  Procedure: Removal of cataract via phacoemulsification and insertion of intra-ocular lens Rayner [id number] +22.5D into the capsular bag of the Right Eye  Attending surgeon: Ronn Melena, MD  Anesthesia: MAC, Topical Akten  Complications: None  Estimated Blood Loss: <10m (minimal)  Specimens: None  Implants:  Implant Name Type Inv. Item Serial No. Manufacturer Lot No. LRB No. Used Action  LENS IOL RAYNER 22.5 - S11 Intraocular Lens LENS IOL RAYNER 22.5 11 SIGHTPATH [id number] Implanted    Indications:  Visually significant age-related cataract, Right Eye  Procedure:  The patient was seen and identified in the pre-operative area. The operative eye was identified and dilated.  The operative eye was marked.  Topical anesthesia was administered to the operative eye.     The patient was then to the operative suite and placed in the supine position.  A timeout was performed confirming the patient, procedure to be performed, and all other relevant information.   The patient's face was prepped and draped in the usual fashion for intra-ocular surgery.  A lid speculum was placed into the operative eye and the surgical microscope moved into place and focused.  A superotemporal paracentesis was created using a 20 gauge paracentesis blade.  BSS mixed with Omidria, followed by 1% lidocaine was injected into the anterior chamber.  Viscoelastic was injected into the anterior chamber.  A temporal clear-corneal main wound incision was created using a 2.457mmicrokeratome.  A continuous curvilinear capsulorrhexis was initiated using an irrigating cystitome and completed using capsulorrhexis forceps.  Hydrodissection and hydrodeliniation were performed.  Viscoelastic was injected into the  anterior chamber.  A phacoemulsification handpiece and a chopper as a second instrument were used to remove the nucleus and epinucleus. The irrigation/aspiration handpiece was used to remove any remaining cortical material.   The capsular bag was reinflated with viscoelastic, checked, and found to be intact.  The intraocular lens was inserted into the capsular bag.  The irrigation/aspiration handpiece was used to remove any remaining viscoelastic.  The clear corneal wound and paracentesis wounds were then hydrated and checked with Weck-Cels to be watertight.  The lid-speculum and drape was removed, and the patient's face was cleaned with a wet and dry 4x4.  Moxifloxacin drops were instilled onto the eye. A clear shield was taped over the eye. The patient was taken to the post-operative care unit in good condition, having tolerated the procedure well.  Post-Op Instructions: [facility] and will receive all other orders and instructions.

## 2022-07-25 NOTE — Interval H&P Note (Signed)
History and Physical Interval Note:  07/25/2022 8:29 AM  The H and P was reviewed and updated. The patient was examined.  No changes were found after exam.  The surgical eye was marked.  Tyrone [name]

## 2022-07-25 NOTE — Anesthesia Preprocedure Evaluation (Signed)
Anesthesia Evaluation  Patient identified by MRN, date of birth, ID band Patient awake    Reviewed: Allergy & Precautions, H&P , NPO status , Patient's Chart, lab work & pertinent test results, reviewed documented beta blocker date and time   Airway Mallampati: II  TM Distance: >3 FB Neck ROM: full    Dental no notable dental hx.    Pulmonary neg pulmonary ROS, former smoker   Pulmonary exam normal breath sounds clear to auscultation       Cardiovascular Exercise Tolerance: Good hypertension, + Peripheral Vascular Disease  negative cardio ROS  Rhythm:regular Rate:Normal     Neuro/Psych  PSYCHIATRIC DISORDERS Anxiety Depression     Neuromuscular disease negative neurological ROS  negative psych ROS   GI/Hepatic negative GI ROS, Neg liver ROS, PUD,GERD  ,,(+) Hepatitis -  Endo/Other  negative endocrine ROS Hyperthyroidism   Renal/GU negative Renal ROS  negative genitourinary   Musculoskeletal   Abdominal   Peds  Hematology negative hematology ROS (+) Blood dyscrasia, anemia   Anesthesia Other Findings   Reproductive/Obstetrics negative OB ROS                             Anesthesia Physical Anesthesia Plan  ASA: 2  Anesthesia Plan: MAC   Post-op Pain Management:    Induction:   PONV Risk Score and Plan:   Airway Management Planned:   Additional Equipment:   Intra-op Plan:   Post-operative Plan:   Informed Consent: I have reviewed the patients History and Physical, chart, labs and discussed the procedure including the risks, benefits and alternatives for the proposed anesthesia with the patient or authorized representative who has indicated his/her understanding and acceptance.     Dental Advisory Given  Plan Discussed with: CRNA  Anesthesia Plan Comments:        Anesthesia Quick Evaluation

## 2022-07-25 NOTE — Anesthesia Procedure Notes (Signed)
Procedure Name: MAC Date/Time: 07/25/2022 9:07 AM  Performed by: Orlie Dakin, CRNAPre-anesthesia Checklist: Patient identified, Emergency Drugs available, Suction available and Patient being monitored Patient Re-evaluated:Patient Re-evaluated prior to induction Oxygen Delivery Method: Nasal cannula Placement Confirmation: positive ETCO2

## 2022-07-25 NOTE — Discharge Instructions (Signed)
Please discharge patient when stable, will follow up today with Dr. Elonda Giuliano at the [facility] [place] office immediately following discharge.  Leave shield in place until visit.  All paperwork with discharge instructions will be given at the office.  [facility] [place] [place] Address:  [address]  [place], [place] [address]  Dr. Ajah Vanhoose's Phone: [phone]  

## 2022-07-25 NOTE — Transfer of Care (Signed)
Immediate Anesthesia Transfer of Care Note  Patient: Tyrone [name]  Procedure(s) Performed: CATARACT EXTRACTION PHACO AND INTRAOCULAR LENS PLACEMENT (IOC) (Right: Eye)  Patient Location: Short Stay  Anesthesia Type:MAC  Level of Consciousness: awake, alert , and oriented  Airway & Oxygen Therapy: Patient Spontanous Breathing  Post-op Assessment: Report given to RN and Post -op Vital signs reviewed and stable  Post vital signs: Reviewed and stable  Last Vitals:  Vitals Value Taken Time  BP    Temp    Pulse    Resp    SpO2      Last Pain:  Vitals:   07/25/22 0808  TempSrc: Oral  PainSc: 0-No pain         Complications: No notable events documented.

## 2022-07-25 NOTE — Anesthesia Postprocedure Evaluation (Signed)
Anesthesia Post Note  Patient: Tyrone [name]  Procedure(s) Performed: CATARACT EXTRACTION PHACO AND INTRAOCULAR LENS PLACEMENT (IOC) (Right: Eye)  Patient location during evaluation: Phase II Anesthesia Type: MAC Level of consciousness: awake Pain management: pain level controlled Vital Signs Assessment: post-procedure vital signs reviewed and stable Respiratory status: spontaneous breathing and respiratory function stable Cardiovascular status: blood pressure returned to baseline and stable Postop Assessment: no headache and no apparent nausea or vomiting Anesthetic complications: no Comments: Late entry   No notable events documented.   Last Vitals:  Vitals:   07/25/22 0808 07/25/22 0930  BP: (!) 156/82 (!) 146/73  Pulse: 66 66  Resp: 17 14  Temp: 36.8 C   SpO2: 99% 100%    Last Pain:  Vitals:   07/25/22 0930  TempSrc:   PainSc: 0-No pain                 Louann Sjogren

## 2022-07-29 NOTE — H&P (Signed)
Surgical History & Physical  Patient Name: Tyrone [name]  DOB: [birth year]  Surgery: Cataract extraction with intraocular lens implant phacoemulsification; Left Eye Surgeon: Ronn Melena MD Surgery Date: 08/08/2022 Pre-Op Date: 06/18/2022  HPI: A 79 Yr. old male patient presents for a cataract evaluation. Patient reports that he has difficulty with reading small print. Patient states that he has trouble seeing captioning on the television. Patient states that his vision is blurred and has difficulty at night driving due to the glare he has from headlights. Patient broke his glasses, patient typically wears contacts. OS: Near, OD: Distance  Medical History: Cataracts Retinal drusen OU Anxiety, GOUT High Blood Pressure  Cardiovascular High Blood Pressure Psychiatry Anxiety All recorded systems are negative except as noted above.  Social Never smoked   Medication Prednisolone-Moxifloxacin-Bromfenac,  Allopurinol, Amlodipine besylate  Sx/Procedures Phaco c IOL OD,   Drug Allergies  NKDA  History & Physical: Heent: cataract OS NECK: supple without bruits LUNGS: lungs clear to auscultation CV: regular rate and rhythm Abdomen: soft and non-tender  Impression & Plan: Assessment: 1.  Hyperopia ; Both Eyes (H52.03) 2.  CATARACT AGE-RELATED NUCLEAR; Both Eyes (H25.13) 3.  DRY EYE SYNDROME/TEAR FILM INSUFFICIENCY; Both Eyes (H04.123) 4.  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  Prefers monovision with contact lenses, OD set for distanct  2.  Cataracts are visually significant and account for the patient's complaints. Discussed all risks, benefits, procedures and recovery, including infection, loss of vision and eye, need for glasses after surgery or additional procedures. Patient understands changing glasses will not improve vision. Patient indicated understanding of procedure. All questions answered. Patient desires to have surgery, recommend  phacoemulsification with intraocular lens. Patient to have preliminary testing necessary (Argos/IOL Master, Mac OCT, TOPO) Educational materials provided. Plan: - proceed with surgery OD followed by OS - will set OD for best distance and OS for -2.00 target (like he does in contacts) - ok with lying flat, no hx of flomax use, no fuchs, no DM - Omidria and combo drop  - likely RayOne lens, will repeat IOL measurements in 2 weeks to confirm  3.  New. Findings, prognosis and treatment options reviewed. Begin basic treatment regimen; warm compresses, lid scrubs, 1000-'2000mg'$  omega 3 fatty acids, and preserved artificial tears QID.  4.  No concerning features. Monitor with yearly dilation.

## 2022-08-01 DIAGNOSIS — H2512 Age-related nuclear cataract, left eye: Secondary | ICD-10-CM | POA: Diagnosis not present

## 2022-08-05 ENCOUNTER — Encounter (HOSPITAL_COMMUNITY): Payer: Self-pay

## 2022-08-05 ENCOUNTER — Encounter (HOSPITAL_COMMUNITY)
Admission: RE | Admit: 2022-08-05 | Discharge: 2022-08-05 | Disposition: A | Discharge status: Home / Self Care | Payer: Medicare Other | Source: Ambulatory Visit | Attending: Optometry | Admitting: Optometry

## 2022-08-08 ENCOUNTER — Encounter (HOSPITAL_COMMUNITY): Payer: Self-pay | Admitting: Optometry

## 2022-08-08 ENCOUNTER — Ambulatory Visit (HOSPITAL_COMMUNITY)
Admission: RE | Admit: 2022-08-08 | Discharge: 2022-08-08 | Disposition: A | Discharge status: Home / Self Care | Payer: Medicare Other | Attending: Optometry | Admitting: Optometry

## 2022-08-08 ENCOUNTER — Ambulatory Visit (HOSPITAL_COMMUNITY): Payer: Medicare Other | Admitting: Anesthesiology

## 2022-08-08 ENCOUNTER — Encounter (HOSPITAL_COMMUNITY)
Admission: RE | Disposition: A | Discharge status: Home / Self Care | Payer: Self-pay | Source: Home / Self Care | Attending: Optometry

## 2022-08-08 DIAGNOSIS — H2512 Age-related nuclear cataract, left eye: Secondary | ICD-10-CM | POA: Diagnosis not present

## 2022-08-08 DIAGNOSIS — K219 Gastro-esophageal reflux disease without esophagitis: Secondary | ICD-10-CM | POA: Diagnosis not present

## 2022-08-08 DIAGNOSIS — Z8711 Personal history of peptic ulcer disease: Secondary | ICD-10-CM | POA: Diagnosis not present

## 2022-08-08 DIAGNOSIS — H5203 Hypermetropia, bilateral: Secondary | ICD-10-CM | POA: Diagnosis not present

## 2022-08-08 DIAGNOSIS — D3132 Benign neoplasm of left choroid: Secondary | ICD-10-CM | POA: Diagnosis not present

## 2022-08-08 DIAGNOSIS — H25812 Combined forms of age-related cataract, left eye: Secondary | ICD-10-CM

## 2022-08-08 DIAGNOSIS — I10 Essential (primary) hypertension: Secondary | ICD-10-CM | POA: Insufficient documentation

## 2022-08-08 DIAGNOSIS — Z79899 Other long term (current) drug therapy: Secondary | ICD-10-CM | POA: Insufficient documentation

## 2022-08-08 DIAGNOSIS — H2513 Age-related nuclear cataract, bilateral: Secondary | ICD-10-CM | POA: Insufficient documentation

## 2022-08-08 DIAGNOSIS — H04123 Dry eye syndrome of bilateral lacrimal glands: Secondary | ICD-10-CM | POA: Diagnosis not present

## 2022-08-08 DIAGNOSIS — I739 Peripheral vascular disease, unspecified: Secondary | ICD-10-CM | POA: Insufficient documentation

## 2022-08-08 DIAGNOSIS — Z87891 Personal history of nicotine dependence: Secondary | ICD-10-CM | POA: Diagnosis not present

## 2022-08-08 HISTORY — PX: CATARACT EXTRACTION W/PHACO: SHX586

## 2022-08-08 SURGERY — PHACOEMULSIFICATION, CATARACT, WITH IOL INSERTION
Anesthesia: Monitor Anesthesia Care | Site: Eye | Laterality: Left

## 2022-08-08 MED ORDER — NEOMYCIN-POLYMYXIN-DEXAMETH 3.5-10000-0.1 OP SUSP
OPHTHALMIC | Status: DC | PRN
  Administered 2022-08-08: 2 [drp] via OPHTHALMIC

## 2022-08-08 MED ORDER — PHENYLEPHRINE HCL 2.5 % OP SOLN
1.0000 [drp] | OPHTHALMIC | Status: AC | PRN
  Administered 2022-08-08 (×3): 1 [drp] via OPHTHALMIC

## 2022-08-08 MED ORDER — PHENYLEPHRINE-KETOROLAC 1-0.3 % IO SOLN
INTRAOCULAR | Status: DC | PRN
  Administered 2022-08-08: 500 mL via OPHTHALMIC

## 2022-08-08 MED ORDER — MIDAZOLAM HCL 2 MG/2ML IJ SOLN
INTRAMUSCULAR | Status: DC | PRN
  Administered 2022-08-08: 1 mg via INTRAVENOUS

## 2022-08-08 MED ORDER — TROPICAMIDE 1 % OP SOLN
1.0000 [drp] | OPHTHALMIC | Status: AC | PRN
  Administered 2022-08-08 (×3): 1 [drp] via OPHTHALMIC

## 2022-08-08 MED ORDER — BSS IO SOLN
INTRAOCULAR | Status: DC | PRN
  Administered 2022-08-08: 15 mL via INTRAOCULAR

## 2022-08-08 MED ORDER — POVIDONE-IODINE 5 % OP SOLN
OPHTHALMIC | Status: DC | PRN
  Administered 2022-08-08: 1 via OPHTHALMIC

## 2022-08-08 MED ORDER — FENTANYL CITRATE (PF) 100 MCG/2ML IJ SOLN
INTRAMUSCULAR | Status: DC | PRN
  Administered 2022-08-08: 50 ug via INTRAVENOUS

## 2022-08-08 MED ORDER — FENTANYL CITRATE (PF) 100 MCG/2ML IJ SOLN
INTRAMUSCULAR | Status: AC
  Filled 2022-08-08: qty 2

## 2022-08-08 MED ORDER — LIDOCAINE HCL (PF) 1 % IJ SOLN
INTRAMUSCULAR | Status: DC | PRN
  Administered 2022-08-08: 1 mL

## 2022-08-08 MED ORDER — PHENYLEPHRINE-KETOROLAC 1-0.3 % IO SOLN
INTRAOCULAR | Status: AC
  Filled 2022-08-08: qty 4

## 2022-08-08 MED ORDER — MIDAZOLAM HCL 2 MG/2ML IJ SOLN
INTRAMUSCULAR | Status: AC
  Filled 2022-08-08: qty 2

## 2022-08-08 MED ORDER — LACTATED RINGERS IV SOLN: INTRAVENOUS | Status: DC

## 2022-08-08 MED ORDER — TETRACAINE HCL 0.5 % OP SOLN
1.0000 [drp] | OPHTHALMIC | Status: AC | PRN
  Administered 2022-08-08 (×3): 1 [drp] via OPHTHALMIC

## 2022-08-08 MED ORDER — SIGHTPATH DOSE#1 NA HYALUR & NA CHOND-NA HYALUR IO KIT
PACK | INTRAOCULAR | Status: DC | PRN
  Administered 2022-08-08: 1 via OPHTHALMIC

## 2022-08-08 MED ORDER — STERILE WATER FOR IRRIGATION IR SOLN
Status: DC | PRN
  Administered 2022-08-08: 25 mL

## 2022-08-08 MED ORDER — LIDOCAINE HCL 3.5 % OP GEL
1.0000 | Freq: Once | OPHTHALMIC | Status: AC
  Administered 2022-08-08: 1 via OPHTHALMIC

## 2022-08-08 SURGICAL SUPPLY — 14 items
CATARACT SUITE SIGHTPATH (MISCELLANEOUS) ×1 IMPLANT
CLOTH BEACON ORANGE TIMEOUT ST (SAFETY) ×2 IMPLANT
DRSG TEGADERM 4X4.75 (GAUZE/BANDAGES/DRESSINGS) ×2 IMPLANT
EYE SHIELD UNIVERSAL CLEAR (GAUZE/BANDAGES/DRESSINGS) IMPLANT
FEE CATARACT SUITE SIGHTPATH (MISCELLANEOUS) ×2 IMPLANT
GLOVE BIOGEL PI IND STRL 7.0 (GLOVE) ×4 IMPLANT
LENS IOL RAYNER 25.5 (Intraocular Lens) ×1 IMPLANT
LENS IOL RAYONE EMV 25.5 (Intraocular Lens) IMPLANT
NDL HYPO 18GX1.5 BLUNT FILL (NEEDLE) ×2 IMPLANT
NEEDLE HYPO 18GX1.5 BLUNT FILL (NEEDLE) ×1 IMPLANT
PAD ARMBOARD 7.5X6 YLW CONV (MISCELLANEOUS) ×2 IMPLANT
SYR TB 1ML LL NO SAFETY (SYRINGE) ×2 IMPLANT
TAPE SURG TRANSPORE 1 IN (GAUZE/BANDAGES/DRESSINGS) IMPLANT
WATER STERILE IRR 250ML POUR (IV SOLUTION) ×2 IMPLANT

## 2022-08-08 NOTE — Transfer of Care (Signed)
Immediate Anesthesia Transfer of Care Note  Patient: Tyrone [name]  Procedure(s) Performed: CATARACT EXTRACTION PHACO AND INTRAOCULAR LENS PLACEMENT (IOC) (Left: Eye)  Patient Location: Short Stay  Anesthesia Type:General  Level of Consciousness: awake, alert , and oriented  Airway & Oxygen Therapy: Patient Spontanous Breathing  Post-op Assessment: Report given to RN and Post -op Vital signs reviewed and stable  Post vital signs: Reviewed and stable  Last Vitals:  Vitals Value Taken Time  BP 159/89 08/08/22 1502  Temp 37.3 C 08/08/22 1502  Pulse 71 08/08/22 1502  Resp 16 08/08/22 1502  SpO2 98 % 08/08/22 1502    Last Pain:  Vitals:   08/08/22 1502  TempSrc: Oral  PainSc: 0-No pain      Patients Stated Pain Goal: 7 ([id number])  Complications: No notable events documented.

## 2022-08-08 NOTE — Discharge Instructions (Signed)
Please discharge patient when stable, will follow up today with Dr. Jaymari Cromie at the [facility] [place] office immediately following discharge.  Leave shield in place until visit.  All paperwork with discharge instructions will be given at the office.  [facility] Address:  [address]  [place], [place] [address]  Dr. Zimal Weisensel's Phone: [phone]  

## 2022-08-08 NOTE — Op Note (Signed)
Date of procedure: 08/08/22  Pre-operative diagnosis: Visually significant age-related nuclear cataract, Left Eye (H25.12)  Post-operative diagnosis: Visually significant age-related nuclear cataract, Left Eye  Procedure: Removal of cataract via phacoemulsification and insertion of intra-ocular lens Rayner [id number] +25.5D into the capsular bag of the Left Eye  Attending surgeon: Bryon Lions, MD  Anesthesia: MAC, Topical Akten  Complications: None  Estimated Blood Loss: <12mL (minimal)  Specimens: None  Implants:  Implant Name Type Inv. Item Serial No. Manufacturer Lot No. LRB No. Used Action  LENS IOL RAYNER 25.5 - S07 Intraocular Lens LENS IOL RAYNER 25.5 07 SIGHTPATH [id number] Left 1 Implanted    Indications:  Visually significant age-related cataract, Left Eye  Procedure:  The patient was seen and identified in the pre-operative area. The operative eye was identified and dilated.  The operative eye was marked.  Topical anesthesia was administered to the operative eye.     The patient was then to the operative suite and placed in the supine position.  A timeout was performed confirming the patient, procedure to be performed, and all other relevant information.   The patient's face was prepped and draped in the usual fashion for intra-ocular surgery.  A lid speculum was placed into the operative eye and the surgical microscope moved into place and focused.  An inferotemporal paracentesis was created using a 20 gauge paracentesis blade.  BSS mixed with Omidria, followed by 1% lidocaine was injected into the anterior chamber.  Viscoelastic was injected into the anterior chamber.  A temporal clear-corneal main wound incision was created using a 2.6mm microkeratome.  A continuous curvilinear capsulorrhexis was initiated using an irrigating cystitome and completed using capsulorrhexis forceps.  Hydrodissection and hydrodeliniation were performed.  Viscoelastic was injected into the  anterior chamber.  A phacoemulsification handpiece and a chopper as a second instrument were used to remove the nucleus and epinucleus. The irrigation/aspiration handpiece was used to remove any remaining cortical material.   The capsular bag was reinflated with viscoelastic, checked, and found to be intact.  The intraocular lens was inserted into the capsular bag.  The irrigation/aspiration handpiece was used to remove any remaining viscoelastic.  The clear corneal wound and paracentesis wounds were then hydrated and checked with Weck-Cels to be watertight.  The lid-speculum and drape was removed, and the patient's face was cleaned with a wet and dry 4x4.  Moxifloxacin was instilled onto the eye. A clear shield was taped over the eye. The patient was taken to the post-operative care unit in good condition, having tolerated the procedure well.  Post-Op Instructions: The patient will follow up at [facility] for a same day post-operative evaluation and will receive all other orders and instructions.

## 2022-08-08 NOTE — Interval H&P Note (Signed)
History and Physical Interval Note:  08/08/2022 2:00 PM  The H and P was reviewed and updated. The patient was examined.  No changes were found after exam.  The surgical eye was marked.  Tyrone [name]

## 2022-08-08 NOTE — Anesthesia Postprocedure Evaluation (Signed)
Anesthesia Post Note  Patient: Tyrone [name]  Procedure(s) Performed: CATARACT EXTRACTION PHACO AND INTRAOCULAR LENS PLACEMENT (IOC) (Left: Eye)  Patient location during evaluation: Phase II Anesthesia Type: MAC Level of consciousness: awake and alert and oriented Pain management: pain level controlled Vital Signs Assessment: post-procedure vital signs reviewed and stable Respiratory status: spontaneous breathing, nonlabored ventilation and respiratory function stable Cardiovascular status: stable and blood pressure returned to baseline Postop Assessment: no apparent nausea or vomiting Anesthetic complications: no  No notable events documented.   Last Vitals:  Vitals:   08/08/22 1359 08/08/22 1502  BP: (!) 155/71 (!) 159/89  Pulse:  71  Resp: 17 16  Temp: 36.8 C 37.3 C  SpO2: 100% 98%    Last Pain:  Vitals:   08/08/22 1502  TempSrc: Oral  PainSc: 0-No pain                 Roschelle Calandra C Micayla Brathwaite

## 2022-08-08 NOTE — Anesthesia Preprocedure Evaluation (Signed)
Anesthesia Evaluation  Patient identified by MRN, date of birth, ID band Patient awake    Reviewed: Allergy & Precautions, H&P , NPO status , Patient's Chart, lab work & pertinent test results  Airway Mallampati: III  TM Distance: >3 FB Neck ROM: Full    Dental  (+) Dental Advisory Given, Teeth Intact   Pulmonary neg pulmonary ROS, former smoker          Cardiovascular hypertension, Pt. on medications + Peripheral Vascular Disease   Rhythm:Regular Rate:Normal     Neuro/Psych negative neurological ROS  negative psych ROS   GI/Hepatic PUD,GERD  Medicated,,(+)     substance abuse  alcohol use, Hepatitis -  Endo/Other  negative endocrine ROS    Renal/GU negative Renal ROS  negative genitourinary   Musculoskeletal negative musculoskeletal ROS (+)    Abdominal   Peds negative pediatric ROS (+)  Hematology negative hematology ROS (+)   Anesthesia Other Findings   Reproductive/Obstetrics negative OB ROS                             Anesthesia Physical Anesthesia Plan  ASA: 2  Anesthesia Plan: MAC   Post-op Pain Management: Minimal or no pain anticipated   Induction: Intravenous  PONV Risk Score and Plan: Treatment may vary due to age or medical condition  Airway Management Planned: Nasal Cannula and Natural Airway  Additional Equipment:   Intra-op Plan:   Post-operative Plan:   Informed Consent: I have reviewed the patients History and Physical, chart, labs and discussed the procedure including the risks, benefits and alternatives for the proposed anesthesia with the patient or authorized representative who has indicated his/her understanding and acceptance.     Dental advisory given  Plan Discussed with: CRNA and Surgeon  Anesthesia Plan Comments:         Anesthesia Quick Evaluation

## 2022-08-20 ENCOUNTER — Encounter (HOSPITAL_COMMUNITY): Payer: Self-pay | Admitting: Optometry

## 2022-09-08 ENCOUNTER — Other Ambulatory Visit: Payer: Self-pay | Admitting: Family Medicine

## 2022-11-21 ENCOUNTER — Emergency Department (HOSPITAL_COMMUNITY): Payer: Medicare Other

## 2022-11-21 ENCOUNTER — Emergency Department (HOSPITAL_COMMUNITY)
Admission: EM | Admit: 2022-11-21 | Discharge: 2022-11-21 | Disposition: A | Discharge status: Home / Self Care | Payer: Medicare Other | Attending: Emergency Medicine | Admitting: Emergency Medicine

## 2022-11-21 ENCOUNTER — Other Ambulatory Visit: Payer: Self-pay

## 2022-11-21 ENCOUNTER — Encounter (HOSPITAL_COMMUNITY): Payer: Self-pay

## 2022-11-21 DIAGNOSIS — R791 Abnormal coagulation profile: Secondary | ICD-10-CM | POA: Insufficient documentation

## 2022-11-21 DIAGNOSIS — S2241XA Multiple fractures of ribs, right side, initial encounter for closed fracture: Secondary | ICD-10-CM | POA: Diagnosis not present

## 2022-11-21 DIAGNOSIS — Y907 Blood alcohol level of 200-239 mg/100 ml: Secondary | ICD-10-CM | POA: Insufficient documentation

## 2022-11-21 DIAGNOSIS — I959 Hypotension, unspecified: Secondary | ICD-10-CM | POA: Diagnosis not present

## 2022-11-21 DIAGNOSIS — M1812 Unilateral primary osteoarthritis of first carpometacarpal joint, left hand: Secondary | ICD-10-CM | POA: Diagnosis not present

## 2022-11-21 DIAGNOSIS — S51812A Laceration without foreign body of left forearm, initial encounter: Secondary | ICD-10-CM | POA: Diagnosis not present

## 2022-11-21 DIAGNOSIS — R9082 White matter disease, unspecified: Secondary | ICD-10-CM | POA: Diagnosis not present

## 2022-11-21 DIAGNOSIS — S299XXA Unspecified injury of thorax, initial encounter: Secondary | ICD-10-CM | POA: Diagnosis not present

## 2022-11-21 DIAGNOSIS — S3991XA Unspecified injury of abdomen, initial encounter: Secondary | ICD-10-CM | POA: Insufficient documentation

## 2022-11-21 DIAGNOSIS — Y92410 Unspecified street and highway as the place of occurrence of the external cause: Secondary | ICD-10-CM | POA: Insufficient documentation

## 2022-11-21 DIAGNOSIS — I10 Essential (primary) hypertension: Secondary | ICD-10-CM | POA: Diagnosis not present

## 2022-11-21 DIAGNOSIS — S0993XA Unspecified injury of face, initial encounter: Secondary | ICD-10-CM | POA: Diagnosis not present

## 2022-11-21 DIAGNOSIS — S59912A Unspecified injury of left forearm, initial encounter: Secondary | ICD-10-CM | POA: Diagnosis present

## 2022-11-21 DIAGNOSIS — R0902 Hypoxemia: Secondary | ICD-10-CM | POA: Diagnosis not present

## 2022-11-21 DIAGNOSIS — F10920 Alcohol use, unspecified with intoxication, uncomplicated: Secondary | ICD-10-CM | POA: Diagnosis not present

## 2022-11-21 DIAGNOSIS — S199XXA Unspecified injury of neck, initial encounter: Secondary | ICD-10-CM | POA: Diagnosis not present

## 2022-11-21 DIAGNOSIS — N200 Calculus of kidney: Secondary | ICD-10-CM | POA: Diagnosis not present

## 2022-11-21 DIAGNOSIS — S0990XA Unspecified injury of head, initial encounter: Secondary | ICD-10-CM | POA: Diagnosis not present

## 2022-11-21 DIAGNOSIS — Z041 Encounter for examination and observation following transport accident: Secondary | ICD-10-CM | POA: Diagnosis not present

## 2022-11-21 DIAGNOSIS — R55 Syncope and collapse: Secondary | ICD-10-CM | POA: Diagnosis not present

## 2022-11-21 DIAGNOSIS — Z87891 Personal history of nicotine dependence: Secondary | ICD-10-CM | POA: Insufficient documentation

## 2022-11-21 DIAGNOSIS — R4182 Altered mental status, unspecified: Secondary | ICD-10-CM | POA: Insufficient documentation

## 2022-11-21 DIAGNOSIS — I700 Atherosclerosis of aorta: Secondary | ICD-10-CM | POA: Diagnosis not present

## 2022-11-21 DIAGNOSIS — M19032 Primary osteoarthritis, left wrist: Secondary | ICD-10-CM | POA: Diagnosis not present

## 2022-11-21 DIAGNOSIS — R404 Transient alteration of awareness: Secondary | ICD-10-CM | POA: Diagnosis not present

## 2022-11-21 DIAGNOSIS — R0689 Other abnormalities of breathing: Secondary | ICD-10-CM | POA: Diagnosis not present

## 2022-11-21 LAB — CBC
HCT: 37.1 % — ABNORMAL LOW (ref 39.0–52.0)
Hemoglobin: 12.1 g/dL — ABNORMAL LOW (ref 13.0–17.0)
MCH: 32.4 pg (ref 26.0–34.0)
MCHC: 32.6 g/dL (ref 30.0–36.0)
MCV: 99.2 fL (ref 80.0–100.0)
Platelets: 184 10*3/uL (ref 150–400)
RBC: 3.74 MIL/uL — ABNORMAL LOW (ref 4.22–5.81)
RDW: 13.7 % (ref 11.5–15.5)
WBC: 7.8 10*3/uL (ref 4.0–10.5)
nRBC: 0 % (ref 0.0–0.2)

## 2022-11-21 LAB — COMPREHENSIVE METABOLIC PANEL
ALT: 22 U/L (ref 0–44)
AST: 27 U/L (ref 15–41)
Albumin: 3.6 g/dL (ref 3.5–5.0)
Alkaline Phosphatase: 66 U/L (ref 38–126)
Anion gap: 10 (ref 5–15)
BUN: 18 mg/dL (ref 8–23)
CO2: 23 mmol/L (ref 22–32)
Calcium: 8.4 mg/dL — ABNORMAL LOW (ref 8.9–10.3)
Chloride: 104 mmol/L (ref 98–111)
Creatinine, Ser: 1.2 mg/dL (ref 0.61–1.24)
GFR, Estimated: >60 mL/min (ref >60)
Glucose, Bld: 95 mg/dL (ref 70–99)
Potassium: 3.9 mmol/L (ref 3.5–5.1)
Sodium: 137 mmol/L (ref 135–145)
Total Bilirubin: 1.1 mg/dL (ref 0.3–1.2)
Total Protein: 6.6 g/dL (ref 6.5–8.1)

## 2022-11-21 LAB — PROTIME-INR
INR: 1.1 (ref 0.8–1.2)
Prothrombin Time: 14.5 seconds (ref 11.4–15.2)

## 2022-11-21 LAB — SAMPLE TO BLOOD BANK

## 2022-11-21 LAB — I-STAT CHEM 8, ED
BUN: 8 mg/dL (ref 8–23)
Calcium, Ion: 0.79 mmol/L — CL (ref 1.15–1.40)
Chloride: 118 mmol/L — ABNORMAL HIGH (ref 98–111)
Creatinine, Ser: 0.7 mg/dL (ref 0.61–1.24)
Glucose, Bld: 54 mg/dL — ABNORMAL LOW (ref 70–99)
HCT: 22 % — ABNORMAL LOW (ref 39.0–52.0)
Hemoglobin: 7.5 g/dL — ABNORMAL LOW (ref 13.0–17.0)
Potassium: 2.1 mmol/L — CL (ref 3.5–5.1)
Sodium: 149 mmol/L — ABNORMAL HIGH (ref 135–145)
TCO2: 13 mmol/L — ABNORMAL LOW (ref 22–32)

## 2022-11-21 LAB — LACTIC ACID, PLASMA: Lactic Acid, Venous: 2.4 mmol/L (ref 0.5–1.9)

## 2022-11-21 LAB — ETHANOL: Alcohol, Ethyl (B): 205 mg/dL — ABNORMAL HIGH (ref <10)

## 2022-11-21 LAB — TROPONIN I (HIGH SENSITIVITY): Troponin I (High Sensitivity): 17 ng/L (ref <18)

## 2022-11-21 MED ORDER — IOHEXOL 300 MG/ML  SOLN
100.0000 mL | Freq: Once | INTRAMUSCULAR | Status: AC | PRN
  Administered 2022-11-21: 100 mL via INTRAVENOUS

## 2022-11-21 MED ORDER — SODIUM CHLORIDE 0.9 % IV BOLUS
1000.0000 mL | Freq: Once | INTRAVENOUS | Status: AC
  Administered 2022-11-21: 1000 mL via INTRAVENOUS

## 2022-11-21 MED ORDER — ETOMIDATE 2 MG/ML IV SOLN
INTRAVENOUS | Status: AC
  Filled 2022-11-21: qty 10

## 2022-11-21 MED ORDER — SUCCINYLCHOLINE CHLORIDE 200 MG/10ML IV SOSY
PREFILLED_SYRINGE | INTRAVENOUS | Status: AC
  Filled 2022-11-21: qty 10
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
## 2022-11-21 NOTE — ED Notes (Signed)
Pt belongings including his wallet was given to his wife Toru Saxman.

## 2022-11-21 NOTE — ED Notes (Signed)
Pt taken to CT with monitor and nurse
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
## 2022-11-21 NOTE — ED Notes (Signed)
Pt ambulated down the hallway with no problems, stated his back hurt some, but eased up as he walked. Cleaned and dressed all skin tears to left arm.

## 2022-11-21 NOTE — ED Notes (Signed)
Patient transported to CT 

## 2022-11-24 ENCOUNTER — Telehealth: Payer: Self-pay

## 2022-11-24 LAB — CBG MONITORING, ED: Glucose-Capillary: 95 mg/dL (ref 70–99)
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
## 2022-12-11 ENCOUNTER — Other Ambulatory Visit: Payer: Self-pay | Admitting: Family Medicine

## 2023-03-09 ENCOUNTER — Other Ambulatory Visit: Payer: Self-pay | Admitting: Family Medicine

## 2023-03-20 DIAGNOSIS — H3540 Unspecified peripheral retinal degeneration: Secondary | ICD-10-CM | POA: Diagnosis not present

## 2023-04-07 ENCOUNTER — Other Ambulatory Visit: Payer: Self-pay | Admitting: Family Medicine

## 2023-04-10 ENCOUNTER — Encounter: Payer: Self-pay | Admitting: Family Medicine

## 2023-04-10 ENCOUNTER — Ambulatory Visit (INDEPENDENT_AMBULATORY_CARE_PROVIDER_SITE_OTHER): Payer: Medicare Other | Admitting: Family Medicine

## 2023-04-10 VITALS — BP 132/66 | HR 69 | Temp 97.7°F | Ht 70.0 in | Wt 177.0 lb

## 2023-04-10 DIAGNOSIS — E785 Hyperlipidemia, unspecified: Secondary | ICD-10-CM | POA: Diagnosis not present

## 2023-04-10 DIAGNOSIS — I10 Essential (primary) hypertension: Secondary | ICD-10-CM

## 2023-04-10 DIAGNOSIS — L299 Pruritus, unspecified: Secondary | ICD-10-CM | POA: Diagnosis not present

## 2023-04-10 DIAGNOSIS — M109 Gout, unspecified: Secondary | ICD-10-CM

## 2023-04-10 DIAGNOSIS — D509 Iron deficiency anemia, unspecified: Secondary | ICD-10-CM | POA: Diagnosis not present

## 2023-04-10 DIAGNOSIS — E559 Vitamin D deficiency, unspecified: Secondary | ICD-10-CM

## 2023-04-10 DIAGNOSIS — R351 Nocturia: Secondary | ICD-10-CM | POA: Diagnosis not present

## 2023-04-10 DIAGNOSIS — E0590 Thyrotoxicosis, unspecified without thyrotoxic crisis or storm: Secondary | ICD-10-CM | POA: Diagnosis not present

## 2023-04-10 DIAGNOSIS — E519 Thiamine deficiency, unspecified: Secondary | ICD-10-CM

## 2023-04-10 DIAGNOSIS — E538 Deficiency of other specified B group vitamins: Secondary | ICD-10-CM

## 2023-04-10 DIAGNOSIS — Z23 Encounter for immunization: Secondary | ICD-10-CM

## 2023-04-10 DIAGNOSIS — R739 Hyperglycemia, unspecified: Secondary | ICD-10-CM | POA: Diagnosis not present

## 2023-04-10 LAB — FOLATE: Folate: >24.2 ng/mL (ref >5.9)

## 2023-04-10 LAB — URIC ACID: Uric Acid, Serum: 5.4 mg/dL (ref 4.0–7.8)

## 2023-04-10 LAB — COMPREHENSIVE METABOLIC PANEL
ALT: 18 U/L (ref 0–53)
AST: 22 U/L (ref 0–37)
Albumin: 4.6 g/dL (ref 3.5–5.2)
Alkaline Phosphatase: 93 U/L (ref 39–117)
BUN: 17 mg/dL (ref 6–23)
CO2: 31 meq/L (ref 19–32)
Calcium: 9.9 mg/dL (ref 8.4–10.5)
Chloride: 100 meq/L (ref 96–112)
Creatinine, Ser: 0.96 mg/dL (ref 0.40–1.50)
GFR: 76.19 mL/min (ref >60.00)
Glucose, Bld: 115 mg/dL — ABNORMAL HIGH (ref 70–99)
Potassium: 5 meq/L (ref 3.5–5.1)
Sodium: 138 meq/L (ref 135–145)
Total Bilirubin: 1.7 mg/dL — ABNORMAL HIGH (ref 0.2–1.2)
Total Protein: 7.4 g/dL (ref 6.0–8.3)

## 2023-04-10 LAB — CBC
HCT: 43.4 % (ref 39.0–52.0)
Hemoglobin: 13.9 g/dL (ref 13.0–17.0)
MCHC: 32 g/dL (ref 30.0–36.0)
MCV: 101.6 fL — ABNORMAL HIGH (ref 78.0–100.0)
Platelets: 196 10*3/uL (ref 150.0–400.0)
RBC: 4.28 Mil/uL (ref 4.22–5.81)
RDW: 13.8 % (ref 11.5–15.5)
WBC: 6.9 10*3/uL (ref 4.0–10.5)

## 2023-04-10 LAB — LIPID PANEL
Cholesterol: 183 mg/dL (ref 0–200)
HDL: 66.2 mg/dL (ref >39.00)
LDL Cholesterol: 104 mg/dL — ABNORMAL HIGH (ref 0–99)
NonHDL: 116.65
Total CHOL/HDL Ratio: 3
Triglycerides: 65 mg/dL (ref 0.0–149.0)
VLDL: 13 mg/dL (ref 0.0–40.0)

## 2023-04-10 LAB — IBC + FERRITIN
Ferritin: 13.3 ng/mL — ABNORMAL LOW (ref 22.0–322.0)
Iron: 137 ug/dL (ref 42–165)
Saturation Ratios: 33.2 % (ref 20.0–50.0)
TIBC: 413 ug/dL (ref 250.0–450.0)
Transferrin: 295 mg/dL (ref 212.0–360.0)

## 2023-04-10 LAB — PSA: PSA: 0.45 ng/mL (ref 0.10–4.00)

## 2023-04-10 LAB — VITAMIN B12: Vitamin B-12: 237 pg/mL (ref 211–911)

## 2023-04-10 LAB — VITAMIN D 25 HYDROXY (VIT D DEFICIENCY, FRACTURES): VITD: 16.73 ng/mL — ABNORMAL LOW (ref 30.00–100.00)

## 2023-04-10 LAB — TSH: TSH: 0.4 u[IU]/mL (ref 0.35–5.50)

## 2023-04-10 LAB — HEMOGLOBIN A1C: Hgb A1c MFr Bld: 5.9 % (ref 4.6–6.5)

## 2023-04-10 NOTE — Assessment & Plan Note (Signed)
Check A1c. 

## 2023-04-10 NOTE — Assessment & Plan Note (Signed)
Blood pressure at goal today without meds. 

## 2023-04-10 NOTE — Assessment & Plan Note (Signed)
Check TSH

## 2023-04-10 NOTE — Assessment & Plan Note (Signed)
Check CBC and iron panel.

## 2023-04-10 NOTE — Assessment & Plan Note (Signed)
Check vitamin D. 

## 2023-04-10 NOTE — Patient Instructions (Signed)
It was very nice to see you today!  We will check blood work today.  Please continue to work on diet and exercise.  I will see back in 6 months.  Come back sooner if needed.  Return in about 6 months (around 10/08/2023) for Follow Up.   Take care, Dr Jimmey Ralph  PLEASE NOTE:  If you had any lab tests, please let us know if you have not heard back within a few days. You may see your results on mychart before we have a chance to review them but we will give you a call once they are reviewed by [place].   If we ordered any referrals today, please let us know if you have not heard from their office within the next week.   If you had any urgent prescriptions sent in today, please check with the pharmacy within an hour of our visit to make sure the prescription was transmitted appropriately.   Please try these tips to maintain a healthy lifestyle:  Eat at least 3 REAL meals and 1-2 snacks per day.  Aim for no more than 5 hours between eating.  If you eat breakfast, please do so within one hour of getting up.   Each meal should contain half fruits/vegetables, one quarter protein, and one quarter carbs (no bigger than a computer mouse)  Cut down on sweet beverages. This includes juice, soda, and sweet tea.   Drink at least 1 glass of water with each meal and aim for at least 8 glasses per day  Exercise at least 150 minutes every week.

## 2023-04-10 NOTE — Progress Notes (Signed)
Tyrone [name] is a 77 y.o. male who presents today for an office visit.  Assessment/Plan:  Chronic Problems Addressed Today: B12 deficiency Check B12.   Hyperglycemia Check A1c.  Dyslipidemia No longer on Lipitor.  Recheck lipids today.  Will likely need to restart back statin depending on today's result.  We discussed lifestyle modifications.  Thiamine deficiency No longer on thiamine replacement.  Will check levels today.  Avitaminosis D Check vitamin D.  Gout Stable.  No recent flares.  Check uric acid level.  Continue allopurinol 300 mg daily.  Subclinical hyperthyroidism Check TSH.  Iron deficiency anemia Check CBC and iron panel.  Pruritus Still has had persistent symptoms.  This is a longstanding issue and he has seen dermatology in the past who did not have any further specific recommendations.  He has had bad reactions with Benadryl in the past including confusion and hallucinations.  We will check labs today as he has had several vitamin deficiency in the past which could be contributing to this.  Will also did discuss importance of topical emollients.  He can use Sarna lotion.  Would also be reasonable for him to try Allegra to see if this helps with his itching.  HTN (hypertension) Blood pressure at goal today without meds.  Preventative Healthcare -flu vaccine given today.  Up-to-date on other vaccines.  Due for colonoscopy in 2026.  Check labs today.    Subjective:  HPI:  See A/P for status of chronic conditions.  Patient is here today for annual follow-up.  He was last seen here a year ago.  Since our last visit he has had cataract extraction.  At her visit a year ago we checked labs which did demonstrate low thiamine and low vitamin D.  Recommend he start supplements for this.  He was also found to have high cholesterol and we recommended starting Lipitor 20 mg daily. He is no longer taking his thiamine supplement or Lipitor. He has been consistent with  taking his thiamine. He is still having some issues with itching.  He is not taking anything for this.  PMH:  The following were reviewed and entered/updated in epic: Past Medical History:  Diagnosis Date   Anemia    Arthritis    hands   Blood transfusion without reported diagnosis    for stomach ulcers   Depression    Gout    History of kidney stones    Hypertension    Multiple duodenal ulcers    Patient Active Problem List   Diagnosis Date Noted   Thiamine deficiency 04/16/2022   Avitaminosis D 04/16/2022   Dyslipidemia 04/16/2022   Pruritus 04/08/2021   Iron deficiency anemia 12/04/2020   Degenerative lumbar spinal stenosis 08/03/2020   Hyperglycemia 04/05/2020   Steatohepatitis 10/04/2019   Subclinical hyperthyroidism 09/14/2019   HTN (hypertension) 07/26/2019   GERD (gastroesophageal reflux disease) 06/06/2019   Chronic left-sided lumbar radiculopathy 01/10/2019   Gout 12/03/2018   Senile purpura (HCC) 12/03/2018   B12 deficiency 07/20/2018   Fatigue 07/20/2018   Left foot drop 05/13/2018   Anxiety 05/13/2018   Alcohol abuse 05/13/2018   Past Surgical History:  Procedure Laterality Date   ABDOMINAL SURGERY     ulcers   CATARACT EXTRACTION W/PHACO Right 07/25/2022   Procedure: CATARACT EXTRACTION PHACO AND INTRAOCULAR LENS PLACEMENT (IOC);  Surgeon: Pecolia Ades, MD;  Location: [facility];  Service: Ophthalmology;  Laterality: Right;  CDE: 10.08   CATARACT EXTRACTION W/PHACO Left 08/08/2022   Procedure: CATARACT EXTRACTION  PHACO AND INTRAOCULAR LENS PLACEMENT (IOC);  Surgeon: Pecolia Ades, MD;  Location: [facility];  Service: Ophthalmology;  Laterality: Left;  CDE: 9.04   COLONOSCOPY     LITHOTRIPSY      Family History  Problem Relation Age of Onset   Colon cancer Neg Hx    Esophageal cancer Neg Hx    Rectal cancer Neg Hx    Stomach cancer Neg Hx     Medications- reviewed and updated Current Outpatient Medications  Medication Sig Dispense Refill    acetaminophen (TYLENOL) 650 MG CR tablet Take 650 mg by mouth every 8 (eight) hours as needed for pain.     allopurinol (ZYLOPRIM) 300 MG tablet TAKE 1 TABLET BY MOUTH DAILY 30 tablet 2   atorvastatin (LIPITOR) 20 MG tablet TAKE 1 TABLET BY MOUTH DAILY (Patient not taking: Reported on 04/10/2023) 30 tablet 3   naproxen sodium (ALEVE) 220 MG tablet Take 220 mg by mouth daily as needed. (Patient not taking: Reported on 04/10/2023)     thiamine (VITAMIN B1) 100 MG tablet Take 1 tablet (100 mg total) by mouth daily. (Patient not taking: Reported on 04/10/2023) 90 tablet 3   No current facility-administered medications for this visit.    Allergies-reviewed and updated No Known Allergies  Social History   Socioeconomic History   Marital status: Married    Spouse name: Not on file   Number of children: Not on file   Years of education: Not on file   Highest education level: Not on file  Occupational History   Not on file  Tobacco Use   Smoking status: Former    Current packs/day: 0.00    Average packs/day: 1 pack/day for 25.0 years (25.0 ttl pk-yrs)    Types: Cigarettes    Start date: 06/30/1977    Quit date: 06/30/2002    Years since quitting: 20.7   Smokeless tobacco: Former    Types: Chew    Quit date: 05/30/2007  Vaping Use   Vaping status: Never Used  Substance and Sexual Activity   Alcohol use: Not Currently    Alcohol/week: 0.0 standard drinks of alcohol    Comment: sober 2.5 months   Drug use: No   Sexual activity: Yes  Other Topics Concern   Not on file  Social History Narrative   Not on file   Social Determinants of Health   Financial Resource Strain: Low Risk  (12/17/2021)   Overall Financial Resource Strain (CARDIA)    Difficulty of Paying Living Expenses: Not hard at all  Food Insecurity: No Food Insecurity (11/24/2022)   Hunger Vital Sign    Worried About Running Out of Food in the Last Year: Never true    Ran Out of Food in the Last Year: Never true   Transportation Needs: No Transportation Needs (11/24/2022)   PRAPARE - Administrator, Civil Service (Medical): No    Lack of Transportation (Non-Medical): No  Physical Activity: Inactive (12/17/2021)   Exercise Vital Sign    Days of Exercise per Week: 0 days    Minutes of Exercise per Session: 0 min  Stress: No Stress Concern Present (12/17/2021)   [employer] of Occupational Health - Occupational Stress Questionnaire    Feeling of Stress : Not at all  Social Connections: Moderately Integrated (12/17/2021)   Social Connection and Isolation Panel [NHANES]    Frequency of Communication with Friends and Family: More than three times a week    Frequency of Social Gatherings with  Friends and Family: More than three times a week    Attends Religious Services: More than 4 times per year    Active Member of Clubs or Organizations: No    Attends Engineer, structural: Never    Marital Status: Married            Objective:  Physical Exam: BP 132/66   Pulse 69   Temp 97.7 F (36.5 C) (Temporal)   Ht 5\' 10"  (1.778 m)   Wt 177 lb (80.3 kg)   SpO2 98%   BMI 25.40 kg/m   Wt Readings from Last 3 Encounters:  04/10/23 177 lb (80.3 kg)  11/21/22 179 lb 14.3 oz (81.6 kg)  08/05/22 180 lb (81.6 kg)  Gen: No acute distress, resting comfortably CV: Regular rate and rhythm with no murmurs appreciated Pulm: Normal work of breathing, clear to auscultation bilaterally with no crackles, wheezes, or rhonchi Neuro: Grossly normal, moves all extremities Psych: Normal affect and thought content      Gurbani Figge M. Jimmey Ralph, MD 04/10/2023 9:53 AM

## 2023-04-10 NOTE — Assessment & Plan Note (Signed)
No longer on Lipitor.  Recheck lipids today.  Will likely need to restart back statin depending on today's result.  We discussed lifestyle modifications.

## 2023-04-10 NOTE — Assessment & Plan Note (Signed)
Stable.  No recent flares.  Check uric acid level.  Continue allopurinol 300 mg daily.

## 2023-04-10 NOTE — Assessment & Plan Note (Signed)
Still has had persistent symptoms.  This is a longstanding issue and he has seen dermatology in the past who did not have any further specific recommendations.  He has had bad reactions with Benadryl in the past including confusion and hallucinations.  We will check labs today as he has had several vitamin deficiency in the past which could be contributing to this.  Will also did discuss importance of topical emollients.  He can use Sarna lotion.  Would also be reasonable for him to try Allegra to see if this helps with his itching.

## 2023-04-10 NOTE — Assessment & Plan Note (Signed)
Check B12 

## 2023-04-10 NOTE — Assessment & Plan Note (Signed)
No longer on thiamine replacement.  Will check levels today.

## 2023-04-14 NOTE — Progress Notes (Signed)
We are still waiting on 1 more test to come back.  Can you please check with the lab about the status of his thiamine?  His bilirubin is elevated compared to last time.  This is probably nothing to worry about but I would like for him to come back to recheck fractionated bilirubin level.  Please place future order.  His cholesterol levels are little bit elevated but stable compared to last time.  To make sure that he has been taking his atorvastatin?  If so, recommend we increase to Lipitor 40 mg daily.  Please send new prescription if he is agreeable.  Vitamin D is still low.  Recommend 50,000 IUs once weekly.  Please send a new prescription for this.  We should recheck in 3 months.  His iron is still a bit low.  Is he taking an iron supplement?  Recommend ferrous sulfate 65 mg every other day.  We should recheck this in 3 months.  His B12 is on the low side of normal.  Recommend he take 1000 mcg daily.  We can recheck this in 3 months.  The rest of his labs are stable and we can recheck everything else in a year or so.

## 2023-04-15 LAB — VITAMIN B1

## 2023-04-15 LAB — EXTRA SPECIMEN

## 2023-04-15 NOTE — Progress Notes (Signed)
Lab canceled his thiamine.  Please have him come back to recheck.

## 2023-04-20 ENCOUNTER — Other Ambulatory Visit: Payer: Self-pay | Admitting: *Deleted

## 2023-04-20 DIAGNOSIS — E538 Deficiency of other specified B group vitamins: Secondary | ICD-10-CM

## 2023-04-20 DIAGNOSIS — R17 Unspecified jaundice: Secondary | ICD-10-CM

## 2023-04-20 MED ORDER — VITAMIN D (ERGOCALCIFEROL) 1.25 MG (50000 UNIT) PO CAPS: 50000.0000 [IU] | ORAL_CAPSULE | ORAL | 0 refills | Status: AC

## 2023-04-21 ENCOUNTER — Other Ambulatory Visit: Payer: Medicare Other

## 2023-05-25 ENCOUNTER — Emergency Department (HOSPITAL_BASED_OUTPATIENT_CLINIC_OR_DEPARTMENT_OTHER): Payer: Medicare Other

## 2023-05-25 ENCOUNTER — Encounter (HOSPITAL_BASED_OUTPATIENT_CLINIC_OR_DEPARTMENT_OTHER): Payer: Self-pay | Admitting: Emergency Medicine

## 2023-05-25 DIAGNOSIS — R030 Elevated blood-pressure reading, without diagnosis of hypertension: Secondary | ICD-10-CM | POA: Diagnosis not present

## 2023-05-25 DIAGNOSIS — Z5321 Procedure and treatment not carried out due to patient leaving prior to being seen by health care provider: Secondary | ICD-10-CM | POA: Insufficient documentation

## 2023-05-25 DIAGNOSIS — I10 Essential (primary) hypertension: Secondary | ICD-10-CM | POA: Insufficient documentation

## 2023-05-25 DIAGNOSIS — R42 Dizziness and giddiness: Secondary | ICD-10-CM | POA: Diagnosis not present

## 2023-05-25 LAB — CBC WITH DIFFERENTIAL/PLATELET
Abs Immature Granulocytes: 0.03 10*3/uL (ref 0.00–0.07)
Basophils Absolute: 0.1 10*3/uL (ref 0.0–0.1)
Basophils Relative: 1 %
Eosinophils Absolute: 0.5 10*3/uL (ref 0.0–0.5)
Eosinophils Relative: 5 %
HCT: 40.6 % (ref 39.0–52.0)
Hemoglobin: 13.2 g/dL (ref 13.0–17.0)
Immature Granulocytes: 0 %
Lymphocytes Relative: 28 %
Lymphs Abs: 2.6 10*3/uL (ref 0.7–4.0)
MCH: 31.9 pg (ref 26.0–34.0)
MCHC: 32.5 g/dL (ref 30.0–36.0)
MCV: 98.1 fL (ref 80.0–100.0)
Monocytes Absolute: 0.5 10*3/uL (ref 0.1–1.0)
Monocytes Relative: 6 %
Neutro Abs: 5.6 10*3/uL (ref 1.7–7.7)
Neutrophils Relative %: 60 %
Platelets: 222 10*3/uL (ref 150–400)
RBC: 4.14 MIL/uL — ABNORMAL LOW (ref 4.22–5.81)
RDW: 13.1 % (ref 11.5–15.5)
WBC: 9.3 10*3/uL (ref 4.0–10.5)
nRBC: 0 % (ref 0.0–0.2)

## 2023-05-25 LAB — URINALYSIS, ROUTINE W REFLEX MICROSCOPIC
Bacteria, UA: NONE SEEN
Bilirubin Urine: NEGATIVE
Glucose, UA: NEGATIVE mg/dL
Hgb urine dipstick: NEGATIVE
Ketones, ur: NEGATIVE mg/dL
Nitrite: NEGATIVE
Protein, ur: NEGATIVE mg/dL
Specific Gravity, Urine: 1.023 (ref 1.005–1.030)
pH: 5.5 (ref 5.0–8.0)

## 2023-05-25 LAB — BASIC METABOLIC PANEL
Anion gap: 6 (ref 5–15)
BUN: 17 mg/dL (ref 8–23)
CO2: 29 mmol/L (ref 22–32)
Calcium: 9.2 mg/dL (ref 8.9–10.3)
Chloride: 101 mmol/L (ref 98–111)
Creatinine, Ser: 0.99 mg/dL (ref 0.61–1.24)
GFR, Estimated: >60 mL/min (ref >60)
Glucose, Bld: 105 mg/dL — ABNORMAL HIGH (ref 70–99)
Potassium: 4.1 mmol/L (ref 3.5–5.1)
Sodium: 136 mmol/L (ref 135–145)

## 2023-05-25 NOTE — ED Triage Notes (Signed)
 High BP x 3 day 170/82 not taking meds Dizziness/ off balance  Seen at [facility] and sent for eval

## 2023-05-26 ENCOUNTER — Emergency Department (HOSPITAL_BASED_OUTPATIENT_CLINIC_OR_DEPARTMENT_OTHER)
Admission: EM | Admit: 2023-05-26 | Discharge: 2023-05-26 | Discharge status: Against Medical Advice | Payer: Medicare Other | Attending: Emergency Medicine | Admitting: Emergency Medicine

## 2023-05-26 ENCOUNTER — Ambulatory Visit: Payer: Self-pay | Admitting: Family Medicine

## 2023-05-26 NOTE — Telephone Encounter (Signed)
 Additional Information . Commented on: Systolic BP  >= 180 OR Diastolic >= 110    Recommended patient to see provider within 24 hours.  Protocols used: Blood Pressure - High-A-AH

## 2023-05-26 NOTE — Telephone Encounter (Signed)
 Additional Information . Commented on: Systolic BP  >= 180 OR Diastolic >= 110    Advised to see physician within 24 hours.  Protocols used: Blood Pressure - High-A-AH

## 2023-05-26 NOTE — Telephone Encounter (Addendum)
 Copied from CRM [id number]. Topic: Clinical - Red Word Triage >> May 26, 2023  9:47 AM Leotis ORN wrote: [facility] that prompted transfer to Nurse Triage: PT was seen at [facility] 05/25/23 for hypertension, pt is wanting blood pressure medication called in today, informed pt that he would need to be seen, appt was initially declined, pt requested transfer to nurse triage, last check blood pressure was 168/82  Chief Complaint: Elevated BP Symptoms: Elevated BP Frequency: Last night-this morning Pertinent Negatives: Patient denies headache, blurred vision, and chest pain Disposition: [] ED /[] Urgent Care (no appt availability in office) / [x] Appointment(In office/virtual)/ []  [facility]/ [] Home Care/ [] Refused Recommended Disposition /[] [facility]/ []  Follow-up with PCP Additional Notes: Patient called in reporting an elevated BP. His blood pressure this morning was 149/65. Patient is currently experiencing generalized weakness and unsteadiness. Patient denies headache, blurred vision and chest pain. Patient was seen in the ED last night for elevated BP (162/82), but left the ED before seeing a provider. Patient is requesting BP medications. This RN advised patient that he needed to see a provider in order to obtain BP medication. Advised that patient see a provider within 24 hours. Patient declined disposition due to transportation. Patient declined virtual visit for today as well. Patient requested this RN to ask provider if BP medication could be prescribed without a visit. This RN called the CAL and reported situation. CAL instructed this RN to make an office visit with PCP in order for patient to be prescribed medication. This RN scheduled patient for an office visit on Friday with Dr. Kennyth, per CAL. Advised patient to call back immediately for worsening symptoms. Advised patient to have a family member take him to his appointment. Patient complied.   Reason for Disposition   Systolic BP  >= 160 OR Diastolic >= 100  Answer Assessment - Initial Assessment Questions 1. BLOOD PRESSURE: What is the blood pressure? Did you take at least two measurements 5 minutes apart?     162/82 yesterday 149/65 this morning  2. ONSET: When did you take your blood pressure?     This morning  3. HOW: How did you take your blood pressure? (e.g., automatic home BP monitor, visiting nurse)     Automatic blood pressure cuff  4. HISTORY: Do you have a history of high blood pressure?     Yes, a couple of years ago  5. MEDICINES: Are you taking any medicines for blood pressure? Have you missed any doses recently?     Been about two years since taking BP meds  6. OTHER SYMPTOMS: Do you have any symptoms? (e.g., blurred vision, chest pain, difficulty breathing, headache, weakness)     Generalized weakness and dizziness, feels unsteady when walking  Protocols used: Blood Pressure - High-A-AH

## 2023-05-26 NOTE — Telephone Encounter (Signed)
 FYI, pt scheduled on 05/29/23 to see you.

## 2023-05-29 ENCOUNTER — Ambulatory Visit (INDEPENDENT_AMBULATORY_CARE_PROVIDER_SITE_OTHER): Payer: Medicare Other | Admitting: Family Medicine

## 2023-05-29 ENCOUNTER — Encounter: Payer: Self-pay | Admitting: Family Medicine

## 2023-05-29 VITALS — BP 159/68 | HR 59 | Temp 97.7°F | Ht 70.0 in | Wt 177.2 lb

## 2023-05-29 DIAGNOSIS — I10 Essential (primary) hypertension: Secondary | ICD-10-CM

## 2023-05-29 DIAGNOSIS — R8281 Pyuria: Secondary | ICD-10-CM | POA: Diagnosis not present

## 2023-05-29 MED ORDER — AMLODIPINE BESYLATE 5 MG PO TABS: 5.0000 mg | ORAL_TABLET | Freq: Every day | ORAL | 3 refills | Status: DC

## 2023-05-29 NOTE — Patient Instructions (Signed)
 It was very nice to see you today!  Please restart the amlodipine .  We will check a urine specimen to make sure that you do not have a urinary tract infection.  Follow up with me in a couple of weeks.  Return in about 2 weeks (around 06/12/2023).   Take care, Dr Kennyth  PLEASE NOTE:  If you had any lab tests, please let us  know if you have not heard back within a few days. You may see your results on mychart before we have a chance to review them but we will give you a call once they are reviewed by us .   If we ordered any referrals today, please let us  know if you have not heard from their office within the next week.   If you had any urgent prescriptions sent in today, please check with the pharmacy within an hour of our visit to make sure the prescription was transmitted appropriately.   Please try these tips to maintain a healthy lifestyle:  Eat at least 3 REAL meals and 1-2 snacks per day.  Aim for no more than 5 hours between eating.  If you eat breakfast, please do so within one hour of getting up.   Each meal should contain half fruits/vegetables, one quarter protein, and one quarter carbs (no bigger than a computer mouse)  Cut down on sweet beverages. This includes juice, soda, and sweet tea.   Drink at least 1 glass of water  with each meal and aim for at least 8 glasses per day  Exercise at least 150 minutes every week.

## 2023-05-29 NOTE — Assessment & Plan Note (Signed)
 Blood pressure above goal today and has been above goal for the last couple of weeks at home as well.  Labs in the Emergency Department were negative.  Will restart amlodipine  5 mg daily.  Previously did well with this.  He will follow-up with us  in 1 to 2 weeks.

## 2023-05-29 NOTE — Progress Notes (Signed)
   Tyrone [name] is a 78 y.o. male who presents today for an office visit.  Assessment/Plan:  New/Acute Problems: Pyuria Found on UA in ED.  Low suspicion for infection given lack of symptoms however will check urine culture to rule out UTI.  Chronic Problems Addressed Today: HTN (hypertension) Blood pressure above goal today and has been above goal for the last couple of weeks at home as well.  Labs in the Emergency Department were negative.  Will restart amlodipine  5 mg daily.  Previously did well with this.  He will follow-up with us  in 1 to 2 weeks.      Subjective:  HPI:  Patient here today for Emergency Department follow up.  He went to urgent care 4 days ago feeling dizzy and elevated blood pressure.  Blood pressure at urgent care was 171/79.  They directed him to go to the Emergency Department.  In the ED had labs and head CT however left before being seen by provider.  Over last couple days he still having elevated blood pressure readings at home with readings into the 160's/90's.  Patient is not sure why his blood pressure has been elevated in the last couple of weeks.  No recent illnesses.  No dietary changes.  Dizziness has improved.  No chest pain.  No shortness of breath.  No blurred vision.  No headache.  No nausea or vomiting.  No leg swelling.  No dysuria.  No urinary frequency.       Objective:  Physical Exam: BP (!) 159/68   Pulse (!) 59   Temp 97.7 F (36.5 C) (Temporal)   Ht 5' 10 (1.778 m)   Wt 177 lb 3.2 oz (80.4 kg)   SpO2 99%   BMI 25.43 kg/m   Gen: No acute distress, resting comfortably CV: Regular rate and rhythm with no murmurs appreciated Pulm: Normal work of breathing, clear to auscultation bilaterally with no crackles, wheezes, or rhonchi Neuro: Grossly normal, moves all extremities Psych: Normal affect and thought content      [name] M. Kennyth, MD 05/29/2023 8:17 AM

## 2023-06-12 ENCOUNTER — Ambulatory Visit: Payer: Medicare Other | Admitting: Family Medicine

## 2023-10-08 ENCOUNTER — Ambulatory Visit (INDEPENDENT_AMBULATORY_CARE_PROVIDER_SITE_OTHER): Payer: Medicare Other | Admitting: Family Medicine

## 2023-10-08 ENCOUNTER — Encounter: Payer: Self-pay | Admitting: Family Medicine

## 2023-10-08 VITALS — BP 173/74 | HR 64 | Temp 98.0°F | Ht 70.0 in | Wt 175.8 lb

## 2023-10-08 DIAGNOSIS — E559 Vitamin D deficiency, unspecified: Secondary | ICD-10-CM | POA: Diagnosis not present

## 2023-10-08 DIAGNOSIS — I10 Essential (primary) hypertension: Secondary | ICD-10-CM

## 2023-10-08 DIAGNOSIS — E538 Deficiency of other specified B group vitamins: Secondary | ICD-10-CM

## 2023-10-08 DIAGNOSIS — L299 Pruritus, unspecified: Secondary | ICD-10-CM

## 2023-10-08 DIAGNOSIS — E0590 Thyrotoxicosis, unspecified without thyrotoxic crisis or storm: Secondary | ICD-10-CM | POA: Diagnosis not present

## 2023-10-08 DIAGNOSIS — E519 Thiamine deficiency, unspecified: Secondary | ICD-10-CM | POA: Diagnosis not present

## 2023-10-08 LAB — CBC WITH DIFFERENTIAL/PLATELET
Basophils Absolute: 0.1 10*3/uL (ref 0.0–0.1)
Basophils Relative: 1.3 % (ref 0.0–3.0)
Eosinophils Absolute: 0.5 10*3/uL (ref 0.0–0.7)
Eosinophils Relative: 8.3 % — ABNORMAL HIGH (ref 0.0–5.0)
HCT: 41.4 % (ref 39.0–52.0)
Hemoglobin: 13.6 g/dL (ref 13.0–17.0)
Lymphocytes Relative: 17.2 % (ref 12.0–46.0)
Lymphs Abs: 1.1 10*3/uL (ref 0.7–4.0)
MCHC: 32.7 g/dL (ref 30.0–36.0)
MCV: 99.5 fl (ref 78.0–100.0)
Monocytes Absolute: 0.3 10*3/uL (ref 0.1–1.0)
Monocytes Relative: 5.5 % (ref 3.0–12.0)
Neutro Abs: 4.3 10*3/uL (ref 1.4–7.7)
Neutrophils Relative %: 67.7 % (ref 43.0–77.0)
Platelets: 174 10*3/uL (ref 150.0–400.0)
RBC: 4.16 Mil/uL — ABNORMAL LOW (ref 4.22–5.81)
RDW: 13.4 % (ref 11.5–15.5)
WBC: 6.3 10*3/uL (ref 4.0–10.5)

## 2023-10-08 LAB — COMPREHENSIVE METABOLIC PANEL WITH GFR
ALT: 16 U/L (ref 0–53)
AST: 20 U/L (ref 0–37)
Albumin: 4.5 g/dL (ref 3.5–5.2)
Alkaline Phosphatase: 74 U/L (ref 39–117)
BUN: 18 mg/dL (ref 6–23)
CO2: 30 meq/L (ref 19–32)
Calcium: 9.1 mg/dL (ref 8.4–10.5)
Chloride: 101 meq/L (ref 96–112)
Creatinine, Ser: 1.01 mg/dL (ref 0.40–1.50)
GFR: 71.44 mL/min (ref >60.00)
Glucose, Bld: 112 mg/dL — ABNORMAL HIGH (ref 70–99)
Potassium: 4.7 meq/L (ref 3.5–5.1)
Sodium: 137 meq/L (ref 135–145)
Total Bilirubin: 0.9 mg/dL (ref 0.2–1.2)
Total Protein: 7 g/dL (ref 6.0–8.3)

## 2023-10-08 LAB — VITAMIN B12: Vitamin B-12: 340 pg/mL (ref 211–911)

## 2023-10-08 LAB — FOLATE: Folate: 21.1 ng/mL (ref >5.9)

## 2023-10-08 LAB — TSH: TSH: 0.41 u[IU]/mL (ref 0.35–5.50)

## 2023-10-08 LAB — T4, FREE: Free T4: 1 ng/dL (ref 0.60–1.60)

## 2023-10-08 LAB — T3, FREE: T3, Free: 3.2 pg/mL (ref 2.3–4.2)

## 2023-10-08 MED ORDER — DOXEPIN HCL 25 MG PO CAPS: 25.0000 mg | ORAL_CAPSULE | Freq: Every day | ORAL | 5 refills | Status: AC

## 2023-10-08 MED ORDER — AMLODIPINE BESYLATE 10 MG PO TABS: 10.0000 mg | ORAL_TABLET | Freq: Every day | ORAL | 3 refills | Status: AC

## 2023-10-08 NOTE — Assessment & Plan Note (Signed)
 Symptoms are still persistent.  This is a longstanding issue and has seen dermatology multiple times in the past for this.  Cannot tolerate Benadryl and has not had much success with other second-generation antihistamines either.  Does not have any benefit with Sarna or other topical treatments.  We will check additional labs today including thiamine , TSH, and B12.  We also discussed medication options to help with management for his pruritus.  Will start doxepin 25 mg nightly.  We discussed potential side effects.  He will follow-up with us  in a couple weeks and we can adjust the dose as tolerated.

## 2023-10-08 NOTE — Assessment & Plan Note (Signed)
 Check thiamine .

## 2023-10-08 NOTE — Assessment & Plan Note (Signed)
 Check b12

## 2023-10-08 NOTE — Patient Instructions (Addendum)
 It was very nice to see you today!  We will increase your amlodipine  to 10 mg daily.   Please monitor your blood pressure at home and let us  know if persistently elevated.  Please try the doxepin  at night to see if this helps with your itching.  I will see you back in a couple weeks.  Come back sooner if needed.  Return in about 2 weeks (around 10/22/2023).   Take care, Dr Daneil Dunker  PLEASE NOTE:  If you had any lab tests, please let us  know if you have not heard back within a few days. You may see your results on mychart before we have a chance to review them but we will give you a call once they are reviewed by us .   If we ordered any referrals today, please let us  know if you have not heard from their office within the next week.   If you had any urgent prescriptions sent in today, please check with the pharmacy within an hour of our visit to make sure the prescription was transmitted appropriately.   Please try these tips to maintain a healthy lifestyle:  Eat at least 3 REAL meals and 1-2 snacks per day.  Aim for no more than 5 hours between eating.  If you eat breakfast, please do so within one hour of getting up.   Each meal should contain half fruits/vegetables, one quarter protein, and one quarter carbs (no bigger than a computer mouse)  Cut down on sweet beverages. This includes juice, soda, and sweet tea.   Drink at least 1 glass of water  with each meal and aim for at least 8 glasses per day  Exercise at least 150 minutes every week.

## 2023-10-08 NOTE — Assessment & Plan Note (Signed)
 Blood pressure above goal today.  We will increase amlodipine  to 10 mg daily.  He will follow-up with us  in a couple of weeks.  Check labs.

## 2023-10-08 NOTE — Assessment & Plan Note (Signed)
 Check vitamin D.

## 2023-10-08 NOTE — Progress Notes (Signed)
   Tyrone [name] is a 78 y.o. male who presents today for an office visit.  Assessment/Plan:  Chronic Problems Addressed Today: Pruritus Symptoms are still persistent.  This is a longstanding issue and has seen dermatology multiple times in the past for this.  Cannot tolerate Benadryl and has not had much success with other second-generation antihistamines either.  Does not have any benefit with Sarna or other topical treatments.  We will check additional labs today including thiamine , TSH, and B12.  We also discussed medication options to help with management for his pruritus.  Will start doxepin 25 mg nightly.  We discussed potential side effects.  He will follow-up with us  in a couple weeks and we can adjust the dose as tolerated.  HTN (hypertension) Blood pressure above goal today.  We will increase amlodipine  to 10 mg daily.  He will follow-up with us  in a couple of weeks.  Check labs.  B12 deficiency Check b12  Thiamine  deficiency Check thiamine .  Avitaminosis D Check vitamin D .     Subjective:  HPI:  See Assessment / plan for status of chronic conditions. He is here today for 6 month follow up. His main concern today is ongoing pruritus.  This has been going on for multiple years he has seen dermatology for this in the past as well.  We discussed this is at his most recent office visit.  At that time we recommended over-the-counter second-generation antihistamines.  He has had severe side effects with Benadryl in the past including hallucinations and he has not been taking this.  Also recommended Sarna.  Fortunately has had progressive and ongoing symptoms.  His not had any rash.  No fevers or chills though has persistent daily itching in all parts of his body.       Objective:  Physical Exam: BP (!) 173/74   Pulse 64   Temp 98 F (36.7 C) (Temporal)   Ht 5\' 10"  (1.778 m)   Wt 175 lb 12.8 oz (79.7 kg)   SpO2 98%   BMI 25.22 kg/m   Gen: No acute distress, resting  comfortably CV: Regular rate and rhythm with no murmurs appreciated Pulm: Normal work of breathing, clear to auscultation bilaterally with no crackles, wheezes, or rhonchi Skin: Warm, dry, no rashes. Neuro: Grossly normal, moves all extremities Psych: Normal affect and thought content      Tyrone [name] M. Daneil Dunker, MD 10/08/2023 8:01 AM

## 2023-10-12 LAB — VITAMIN B1: Vitamin B1 (Thiamine): 21 nmol/L (ref 8–30)

## 2023-10-15 ENCOUNTER — Ambulatory Visit: Payer: Self-pay | Admitting: Family Medicine

## 2023-10-15 NOTE — Progress Notes (Signed)
 So far all of his labs are stable though we are still waiting on his B12. Can we check with the lab?

## 2023-10-22 ENCOUNTER — Ambulatory Visit (INDEPENDENT_AMBULATORY_CARE_PROVIDER_SITE_OTHER): Admitting: Family Medicine

## 2023-10-22 ENCOUNTER — Encounter: Payer: Self-pay | Admitting: Family Medicine

## 2023-10-22 VITALS — BP 152/72 | HR 71 | Temp 97.9°F | Wt 178.4 lb

## 2023-10-22 DIAGNOSIS — I10 Essential (primary) hypertension: Secondary | ICD-10-CM | POA: Diagnosis not present

## 2023-10-22 DIAGNOSIS — L299 Pruritus, unspecified: Secondary | ICD-10-CM

## 2023-10-22 NOTE — Assessment & Plan Note (Signed)
 Above goal today however much better than his previous office visit here.  His home readings are been at goal as well.  He is doing well with current dose amlodipine  10 mg daily.  He will continue to monitor at home and follow-up with us  on MyChart in a few weeks.  Recheck in 3 to 6 months in office.

## 2023-10-22 NOTE — Assessment & Plan Note (Signed)
 He has done very well with doxepin .  Tolerating well and has had significant relief in his pruritus symptoms.  Will continue current dose for now and he will follow-up with us  in a few weeks if he needs any further dose adjustments.  He will come back for in person office visit in 3 to 6 months.

## 2023-10-22 NOTE — Progress Notes (Signed)
   Tyrone [name] is a 78 y.o. male who presents today for an office visit.  Assessment/Plan:  Chronic Problems Addressed Today: HTN (hypertension) Above goal today however much better than his previous office visit here.  His home readings are been at goal as well.  He is doing well with current dose amlodipine  10 mg daily.  He will continue to monitor at home and follow-up with us  on MyChart in a few weeks.  Recheck in 3 to 6 months in office.  Pruritus He has done very well with doxepin .  Tolerating well and has had significant relief in his pruritus symptoms.  Will continue current dose for now and he will follow-up with us  in a few weeks if he needs any further dose adjustments.  He will come back for in person office visit in 3 to 6 months.     Subjective:  HPI:  See Assessment / plan for status of chronic conditions. Patient here for 2 week follow up.  At his last visit here, we increased his amlodipine  to 10 mg daily for elevated blood pressure.  We also started him on doxepin  25 mg nightly for pruritus.  He has done well with both of these medications.  No significant side effects.  He is monitoring his blood pressure at home and typically in the 110s to 120s over 70s.  Doxepin  has worked very well for him.  His pruritus has significantly improved.       Objective:  Physical Exam: BP (!) 152/72   Pulse 71   Temp 97.9 F (36.6 C) (Temporal)   Wt 178 lb 6.4 oz (80.9 kg)   SpO2 99%   BMI 25.60 kg/m   Gen: No acute distress, resting comfortably CV: Regular rate and rhythm with no murmurs appreciated Pulm: Normal work of breathing, clear to auscultation bilaterally with no crackles, wheezes, or rhonchi Neuro: Grossly normal, moves all extremities Psych: Normal affect and thought content      Tomoki Lucken M. Daneil Dunker, MD 10/22/2023 7:43 AM

## 2023-10-22 NOTE — Patient Instructions (Signed)
 It was very nice to see you today!  I am glad that you are doing well!  We will continue with your current medications.  I will see you back in 6 months.  Come back sooner if needed.  Return in about 6 months (around 04/22/2024) for Follow Up.   Take care, Dr Daneil Dunker  PLEASE NOTE:  If you had any lab tests, please let us  know if you have not heard back within a few days. You may see your results on mychart before we have a chance to review them but we will give you a call once they are reviewed by us .   If we ordered any referrals today, please let us  know if you have not heard from their office within the next week.   If you had any urgent prescriptions sent in today, please check with the pharmacy within an hour of our visit to make sure the prescription was transmitted appropriately.   Please try these tips to maintain a healthy lifestyle:  Eat at least 3 REAL meals and 1-2 snacks per day.  Aim for no more than 5 hours between eating.  If you eat breakfast, please do so within one hour of getting up.   Each meal should contain half fruits/vegetables, one quarter protein, and one quarter carbs (no bigger than a computer mouse)  Cut down on sweet beverages. This includes juice, soda, and sweet tea.   Drink at least 1 glass of water  with each meal and aim for at least 8 glasses per day  Exercise at least 150 minutes every week.

## 2023-11-22 ENCOUNTER — Other Ambulatory Visit: Payer: Self-pay | Admitting: Family Medicine

## 2024-02-17 ENCOUNTER — Other Ambulatory Visit: Payer: Self-pay | Admitting: Family Medicine

## 2024-03-22 DIAGNOSIS — H3540 Unspecified peripheral retinal degeneration: Secondary | ICD-10-CM | POA: Diagnosis not present

## 2024-04-17 ENCOUNTER — Other Ambulatory Visit: Payer: Self-pay | Admitting: Family Medicine

## 2024-04-22 ENCOUNTER — Encounter: Payer: Self-pay | Admitting: Family Medicine

## 2024-04-22 ENCOUNTER — Ambulatory Visit: Admitting: Family Medicine

## 2024-04-22 VITALS — BP 130/64 | HR 85 | Temp 98.1°F | Ht 70.0 in | Wt 185.4 lb

## 2024-04-22 DIAGNOSIS — I10 Essential (primary) hypertension: Secondary | ICD-10-CM

## 2024-04-22 DIAGNOSIS — M109 Gout, unspecified: Secondary | ICD-10-CM | POA: Diagnosis not present

## 2024-04-22 DIAGNOSIS — D509 Iron deficiency anemia, unspecified: Secondary | ICD-10-CM

## 2024-04-22 DIAGNOSIS — R739 Hyperglycemia, unspecified: Secondary | ICD-10-CM

## 2024-04-22 DIAGNOSIS — E785 Hyperlipidemia, unspecified: Secondary | ICD-10-CM | POA: Diagnosis not present

## 2024-04-22 DIAGNOSIS — E0590 Thyrotoxicosis, unspecified without thyrotoxic crisis or storm: Secondary | ICD-10-CM

## 2024-04-22 DIAGNOSIS — R351 Nocturia: Secondary | ICD-10-CM

## 2024-04-22 DIAGNOSIS — E538 Deficiency of other specified B group vitamins: Secondary | ICD-10-CM | POA: Diagnosis not present

## 2024-04-22 DIAGNOSIS — Z23 Encounter for immunization: Secondary | ICD-10-CM

## 2024-04-22 DIAGNOSIS — E519 Thiamine deficiency, unspecified: Secondary | ICD-10-CM

## 2024-04-22 DIAGNOSIS — E559 Vitamin D deficiency, unspecified: Secondary | ICD-10-CM | POA: Diagnosis not present

## 2024-04-22 LAB — COMPREHENSIVE METABOLIC PANEL WITH GFR
ALT: 11 U/L (ref 0–53)
AST: 16 U/L (ref 0–37)
Albumin: 4.5 g/dL (ref 3.5–5.2)
Alkaline Phosphatase: 74 U/L (ref 39–117)
BUN: 11 mg/dL (ref 6–23)
CO2: 27 meq/L (ref 19–32)
Calcium: 9.7 mg/dL (ref 8.4–10.5)
Chloride: 102 meq/L (ref 96–112)
Creatinine, Ser: 0.97 mg/dL (ref 0.40–1.50)
GFR: 74.7 mL/min (ref >60.00)
Glucose, Bld: 129 mg/dL — ABNORMAL HIGH (ref 70–99)
Potassium: 3.7 meq/L (ref 3.5–5.1)
Sodium: 139 meq/L (ref 135–145)
Total Bilirubin: 1.1 mg/dL (ref 0.2–1.2)
Total Protein: 7.4 g/dL (ref 6.0–8.3)

## 2024-04-22 LAB — CBC
HCT: 40.2 % (ref 39.0–52.0)
Hemoglobin: 13.6 g/dL (ref 13.0–17.0)
MCHC: 33.8 g/dL (ref 30.0–36.0)
MCV: 98.2 fl (ref 78.0–100.0)
Platelets: 196 K/uL (ref 150.0–400.0)
RBC: 4.1 Mil/uL — ABNORMAL LOW (ref 4.22–5.81)
RDW: 13.3 % (ref 11.5–15.5)
WBC: 6.4 K/uL (ref 4.0–10.5)

## 2024-04-22 LAB — HEMOGLOBIN A1C: Hgb A1c MFr Bld: 5.7 % (ref 4.6–6.5)

## 2024-04-22 LAB — IBC + FERRITIN
Ferritin: 35.4 ng/mL (ref 22.0–322.0)
Iron: 80 ug/dL (ref 42–165)
Saturation Ratios: 22.8 % (ref 20.0–50.0)
TIBC: 351.4 ug/dL (ref 250.0–450.0)
Transferrin: 251 mg/dL (ref 212.0–360.0)

## 2024-04-22 LAB — LIPID PANEL
Cholesterol: 200 mg/dL (ref 0–200)
HDL: 56.9 mg/dL (ref >39.00)
LDL Cholesterol: 121 mg/dL — ABNORMAL HIGH (ref 0–99)
NonHDL: 143.04
Total CHOL/HDL Ratio: 4
Triglycerides: 108 mg/dL (ref 0.0–149.0)
VLDL: 21.6 mg/dL (ref 0.0–40.0)

## 2024-04-22 LAB — URIC ACID: Uric Acid, Serum: 4.9 mg/dL (ref 4.0–7.8)

## 2024-04-22 LAB — FOLATE: Folate: >23.7 ng/mL (ref >5.9)

## 2024-04-22 LAB — VITAMIN D 25 HYDROXY (VIT D DEFICIENCY, FRACTURES): VITD: 31.42 ng/mL (ref 30.00–100.00)

## 2024-04-22 LAB — TSH: TSH: 1.12 u[IU]/mL (ref 0.35–5.50)

## 2024-04-22 LAB — PSA: PSA: 0.38 ng/mL (ref 0.10–4.00)

## 2024-04-22 LAB — VITAMIN B12: Vitamin B-12: 294 pg/mL (ref 211–911)

## 2024-04-22 MED ORDER — ATORVASTATIN CALCIUM 20 MG PO TABS: 20.0000 mg | ORAL_TABLET | Freq: Every day | ORAL | 3 refills | Status: AC

## 2024-04-22 NOTE — Progress Notes (Signed)
   Tyrone [name] is a 78 y.o. male who presents today for an office visit.  Assessment/Plan:  Chronic Problems Addressed Today: B12 deficiency Check B12.   Gout Stable. No recent flares.  Check uric acid level.  Continue allopurinol  300 mg daily.  HTN (hypertension) Blood pressure at goal.  Reviewed medications and patient is taking both amlodipine  10 mg daily and amlodipine  5 mg daily.  Advised patient he should discontinue the 5 mg daily continue with only 10 mg daily along.  He will monitor at home and let us  know if persistently elevated.  Recheck in office in 3 to 6 months.  Subclinical hyperthyroidism Check TSH with labs.  Hyperglycemia Check A1c with labs.  Iron deficiency anemia Check CBC and iron panel.  Thiamine  deficiency Check thiamine   Avitaminosis D Check vitamin D   Dyslipidemia On Lipitor 20 mg daily.  Check lipids.  Preventative health care Flu shot given today.  Check labs.  Due for colonoscopy next year.    Subjective:  HPI:  See assessment / plan for status of chronic conditions.   Discussed the use of AI scribe software for clinical note transcription with the patient, who gave verbal consent to proceed.  History of Present Illness Tyrone [name] is a 78 year old male who presents for a routine follow-up visit.  He has no new updates or changes in his health status since his last visit six months ago. He has received his flu shot.  He is currently taking Lipitor, Norvasc , and allopurinol . He takes a total of 15 mg of Norvasc  daily, consisting of one 10 mg tablet and one 5 mg tablet.  He agrees to have blood work done today, as it has been about a year since his last A1c and cholesterol check.  He mentions that his diet and exercise routine have not been as good as he would like, and he has gained some weight. He wants to improve both his diet and exercise regimen to return to his previous weight of 170 pounds.         Objective:   Physical Exam: BP 130/64 (BP Location: Left Arm, Patient Position: Sitting, Cuff Size: Normal)   Pulse 85   Temp 98.1 F (36.7 C) (Temporal)   Ht 5' 10 (1.778 m)   Wt 185 lb 6.4 oz (84.1 kg)   SpO2 99%   BMI 26.60 kg/m   Gen: No acute distress, resting comfortably CV: Regular rate and rhythm with no murmurs appreciated Pulm: Normal work of breathing, clear to auscultation bilaterally with no crackles, wheezes, or rhonchi Neuro: Grossly normal, moves all extremities Psych: Normal affect and thought content      Hamp Moreland M. Kennyth, MD 04/22/2024 7:39 AM

## 2024-04-22 NOTE — Assessment & Plan Note (Signed)
On Lipitor 20 mg daily.  Check lipids. 

## 2024-04-22 NOTE — Assessment & Plan Note (Signed)
 Blood pressure at goal.  Reviewed medications and patient is taking both amlodipine  10 mg daily and amlodipine  5 mg daily.  Advised patient he should discontinue the 5 mg daily continue with only 10 mg daily along.  He will monitor at home and let us  know if persistently elevated.  Recheck in office in 3 to 6 months.

## 2024-04-22 NOTE — Assessment & Plan Note (Signed)
 Check B12

## 2024-04-22 NOTE — Assessment & Plan Note (Signed)
 Check thiamine .

## 2024-04-22 NOTE — Assessment & Plan Note (Signed)
 Check CBC and iron panel.

## 2024-04-22 NOTE — Assessment & Plan Note (Signed)
 Check vitamin D.

## 2024-04-22 NOTE — Assessment & Plan Note (Signed)
 Stable.  No recent flares.  Check uric acid level.  Continue allopurinol 300 mg daily.

## 2024-04-22 NOTE — Patient Instructions (Addendum)
 It was very nice to see you today!  VISIT SUMMARY: Today, we reviewed your current health status and medications. You received your flu shot, and we discussed your diet and exercise routine. Blood work was ordered to check your cholesterol, A1c, and vitamin D  levels.  YOUR PLAN: ESSENTIAL HYPERTENSION: Your high blood pressure is being managed with amlodipine . -Continue taking amlodipine  10 mg daily. The additional 5 mg dose has been discontinued.  HYPERLIPIDEMIA: Your cholesterol levels need to be checked. -Continue taking Lipitor as prescribed. -Blood work has been ordered to assess your cholesterol levels.  GOUT: Your gout is stable with your current medication. -Continue taking allopurinol  as prescribed.  VITAMIN D  DEFICIENCY: We need to check your vitamin D  levels. -Blood work has been ordered to assess your vitamin D  levels.  GENERAL HEALTH MAINTENANCE: Routine health maintenance is important. -You received your flu shot today. -Your next colonoscopy is due next year. Please ensure it is scheduled.  Return in about 6 months (around 10/21/2024) for Follow Up.   Take care, Dr Kennyth  PLEASE NOTE:  If you had any lab tests, please let us  know if you have not heard back within a few days. You may see your results on mychart before we have a chance to review them but we will give you a call once they are reviewed by us .   If we ordered any referrals today, please let us  know if you have not heard from their office within the next week.   If you had any urgent prescriptions sent in today, please check with the pharmacy within an hour of our visit to make sure the prescription was transmitted appropriately.   Please try these tips to maintain a healthy lifestyle:  Eat at least 3 REAL meals and 1-2 snacks per day.  Aim for no more than 5 hours between eating.  If you eat breakfast, please do so within one hour of getting up.   Each meal should contain half fruits/vegetables, one  quarter protein, and one quarter carbs (no bigger than a computer mouse)  Cut down on sweet beverages. This includes juice, soda, and sweet tea.   Drink at least 1 glass of water  with each meal and aim for at least 8 glasses per day  Exercise at least 150 minutes every week.    Preventive Care 42 Years and Older, Male Preventive care refers to lifestyle choices and visits with your health care provider that can promote health and wellness. Preventive care visits are also called wellness exams. What can I expect for my preventive care visit? Counseling During your preventive care visit, your health care provider may ask about your: Medical history, including: Past medical problems. Family medical history. History of falls. Current health, including: Emotional well-being. Home life and relationship well-being. Sexual activity. Memory and ability to understand (cognition). Lifestyle, including: Alcohol , nicotine or tobacco, and drug use. Access to firearms. Diet, exercise, and sleep habits. Work and work astronomer. Sunscreen use. Safety issues such as seatbelt and bike helmet use. Physical exam Your health care provider will check your: Height and weight. These may be used to calculate your BMI (body mass index). BMI is a measurement that tells if you are at a healthy weight. Waist circumference. This measures the distance around your waistline. This measurement also tells if you are at a healthy weight and may help predict your risk of certain diseases, such as type 2 diabetes and high blood pressure. Heart rate and blood pressure. Body temperature.  Skin for abnormal spots. What immunizations do I need?  Vaccines are usually given at various ages, according to a schedule. Your health care provider will recommend vaccines for you based on your age, medical history, and lifestyle or other factors, such as travel or where you work. What tests do I need? Screening Your health  care provider may recommend screening tests for certain conditions. This may include: Lipid and cholesterol levels. Diabetes screening. This is done by checking your blood sugar (glucose) after you have not eaten for a while (fasting). Hepatitis C test. Hepatitis B test. HIV (human immunodeficiency virus) test. STI (sexually transmitted infection) testing, if you are at risk. Lung cancer screening. Colorectal cancer screening. Prostate cancer screening. Abdominal aortic aneurysm (AAA) screening. You may need this if you are a current or former smoker. Talk with your health care provider about your test results, treatment options, and if necessary, the need for more tests. Follow these instructions at home: Eating and drinking  Eat a diet that includes fresh fruits and vegetables, whole grains, lean protein, and low-fat dairy products. Limit your intake of foods with high amounts of sugar, saturated fats, and salt. Take vitamin and mineral supplements as recommended by your health care provider. Do not drink alcohol  if your health care provider tells you not to drink. If you drink alcohol : Limit how much you have to 0-2 drinks a day. Know how much alcohol  is in your drink. In the [place] one drink equals one 12 oz bottle of beer (355 mL), one 5 oz glass of wine (148 mL), or one 1 oz glass of hard liquor (44 mL). Lifestyle Brush your teeth every morning and night with fluoride toothpaste. Floss one time each day. Exercise for at least 30 minutes 5 or more days each week. Do not use any products that contain nicotine or tobacco. These products include cigarettes, chewing tobacco, and vaping devices, such as e-cigarettes. If you need help quitting, ask your health care provider. Do not use drugs. If you are sexually active, practice safe sex. Use a condom or other form of protection to prevent STIs. Take aspirin only as told by your health care provider. Make sure that you understand how much  to take and what form to take. Work with your health care provider to find out whether it is safe and beneficial for you to take aspirin daily. Ask your health care provider if you need to take a cholesterol-lowering medicine (statin). Find healthy ways to manage stress, such as: Meditation, yoga, or listening to music. Journaling. Talking to a trusted person. Spending time with friends and family. Safety Always wear your seat belt while driving or riding in a vehicle. Do not drive: If you have been drinking alcohol . Do not ride with someone who has been drinking. When you are tired or distracted. While texting. If you have been using any mind-altering substances or drugs. Wear a helmet and other protective equipment during sports activities. If you have firearms in your house, make sure you follow all gun safety procedures. Minimize exposure to UV radiation to reduce your risk of skin cancer. What's next? Visit your health care provider once a year for an annual wellness visit. Ask your health care provider how often you should have your eyes and teeth checked. Stay up to date on all vaccines. This information is not intended to replace advice given to you by your health care provider. Make sure you discuss any questions you have with your health care  provider. Document Revised: 10/31/2020 Document Reviewed: 10/31/2020 Elsevier Patient Education  2024 [employer].

## 2024-04-22 NOTE — Assessment & Plan Note (Signed)
Check TSH with labs. 

## 2024-04-22 NOTE — Assessment & Plan Note (Signed)
Check A1c with labs. 

## 2024-04-27 LAB — VITAMIN B1: Vitamin B1 (Thiamine): 43 nmol/L — ABNORMAL HIGH (ref 8–30)

## 2024-04-28 ENCOUNTER — Ambulatory Visit: Payer: Self-pay | Admitting: Family Medicine

## 2024-04-28 NOTE — Progress Notes (Signed)
 His cholesterol is a bit higher than last time-can we make sure that he is still taking the Lipitor 20 mg daily?  If so recommend we increase to 40 mg daily and recheck in 3 to 6 months.  All of his other labs are stable.  Do not need to make any other adjustments to treatment plan at this time.  We can recheck in 6 to 12 months.

## 2024-05-17 ENCOUNTER — Other Ambulatory Visit: Payer: Self-pay | Admitting: Family Medicine

## 2024-10-25 ENCOUNTER — Ambulatory Visit: Admitting: Family Medicine
